# Patient Record
Sex: Female | Born: 1955
Health system: Southern US, Community
[De-identification: ages and names within clinical notes are randomized; demographics above are authoritative.]

## PROBLEM LIST (undated history)

## (undated) DIAGNOSIS — M1990 Unspecified osteoarthritis, unspecified site: Secondary | ICD-10-CM

## (undated) DIAGNOSIS — G43909 Migraine, unspecified, not intractable, without status migrainosus: Secondary | ICD-10-CM

## (undated) DIAGNOSIS — S92919A Unspecified fracture of unspecified toe(s), initial encounter for closed fracture: Secondary | ICD-10-CM

## (undated) DIAGNOSIS — S82899A Other fracture of unspecified lower leg, initial encounter for closed fracture: Secondary | ICD-10-CM

## (undated) DIAGNOSIS — N951 Menopausal and female climacteric states: Secondary | ICD-10-CM

## (undated) DIAGNOSIS — K219 Gastro-esophageal reflux disease without esophagitis: Secondary | ICD-10-CM

## (undated) DIAGNOSIS — D649 Anemia, unspecified: Secondary | ICD-10-CM

## (undated) DIAGNOSIS — M8580 Other specified disorders of bone density and structure, unspecified site: Secondary | ICD-10-CM

## (undated) DIAGNOSIS — F909 Attention-deficit hyperactivity disorder, unspecified type: Secondary | ICD-10-CM

## (undated) DIAGNOSIS — T7840XA Allergy, unspecified, initial encounter: Secondary | ICD-10-CM

## (undated) DIAGNOSIS — F32A Depression, unspecified: Secondary | ICD-10-CM

## (undated) DIAGNOSIS — G4730 Sleep apnea, unspecified: Secondary | ICD-10-CM

## (undated) DIAGNOSIS — N200 Calculus of kidney: Secondary | ICD-10-CM

## (undated) DIAGNOSIS — B019 Varicella without complication: Secondary | ICD-10-CM

## (undated) DIAGNOSIS — S069X9A Unspecified intracranial injury with loss of consciousness of unspecified duration, initial encounter: Secondary | ICD-10-CM

## (undated) DIAGNOSIS — F329 Major depressive disorder, single episode, unspecified: Secondary | ICD-10-CM

## (undated) HISTORY — DX: Unspecified fracture of unspecified toe(s), initial encounter for closed fracture: S92.919A

## (undated) HISTORY — DX: Sleep apnea, unspecified: G47.30

## (undated) HISTORY — DX: Migraine, unspecified, not intractable, without status migrainosus: G43.909

## (undated) HISTORY — DX: Gastro-esophageal reflux disease without esophagitis: K21.9

## (undated) HISTORY — DX: Attention-deficit hyperactivity disorder, unspecified type: F90.9

## (undated) HISTORY — DX: Varicella without complication: B01.9

## (undated) HISTORY — DX: Allergy, unspecified, initial encounter: T78.40XA

## (undated) HISTORY — PX: REPAIR NONUNION / MALUNION METATARSAL / TARSAL BONES: SUR1194

## (undated) HISTORY — DX: Anemia, unspecified: D64.9

## (undated) HISTORY — DX: Other fracture of unspecified lower leg, initial encounter for closed fracture: S82.899A

## (undated) HISTORY — PX: TONSILLECTOMY: SUR1361

## (undated) HISTORY — DX: Calculus of kidney: N20.0

## (undated) HISTORY — DX: Unspecified intracranial injury with loss of consciousness of unspecified duration, initial encounter: S06.9X9A

## (undated) HISTORY — DX: Depression, unspecified: F32.A

## (undated) HISTORY — PX: DILATION AND CURETTAGE OF UTERUS: SHX78

## (undated) HISTORY — DX: Menopausal and female climacteric states: N95.1

## (undated) HISTORY — DX: Major depressive disorder, single episode, unspecified: F32.9

## (undated) HISTORY — DX: Other specified disorders of bone density and structure, unspecified site: M85.80

## (undated) HISTORY — DX: Unspecified osteoarthritis, unspecified site: M19.90

---

## 1979-12-21 DIAGNOSIS — S069X9A Unspecified intracranial injury with loss of consciousness of unspecified duration, initial encounter: Secondary | ICD-10-CM

## 1979-12-21 DIAGNOSIS — S069XAA Unspecified intracranial injury with loss of consciousness status unknown, initial encounter: Secondary | ICD-10-CM

## 1979-12-21 HISTORY — DX: Unspecified intracranial injury with loss of consciousness status unknown, initial encounter: S06.9XAA

## 1979-12-21 HISTORY — DX: Unspecified intracranial injury with loss of consciousness of unspecified duration, initial encounter: S06.9X9A

## 1998-11-29 ENCOUNTER — Emergency Department (HOSPITAL_COMMUNITY): Admission: EM | Admit: 1998-11-29 | Discharge: 1998-11-29 | Payer: Self-pay | Admitting: Emergency Medicine

## 1998-11-29 ENCOUNTER — Encounter: Payer: Self-pay | Admitting: Emergency Medicine

## 1999-01-28 ENCOUNTER — Other Ambulatory Visit: Admission: RE | Admit: 1999-01-28 | Discharge: 1999-01-28 | Payer: Self-pay | Admitting: Obstetrics and Gynecology

## 1999-02-25 ENCOUNTER — Ambulatory Visit (HOSPITAL_COMMUNITY): Admission: RE | Admit: 1999-02-25 | Discharge: 1999-02-25 | Payer: Self-pay | Admitting: Obstetrics and Gynecology

## 2000-02-17 ENCOUNTER — Other Ambulatory Visit: Admission: RE | Admit: 2000-02-17 | Discharge: 2000-02-17 | Payer: Self-pay | Admitting: Obstetrics and Gynecology

## 2000-03-18 ENCOUNTER — Encounter: Admission: RE | Admit: 2000-03-18 | Discharge: 2000-03-18 | Payer: Self-pay | Admitting: Family Medicine

## 2000-03-18 ENCOUNTER — Encounter: Payer: Self-pay | Admitting: Family Medicine

## 2000-04-26 ENCOUNTER — Encounter: Admission: RE | Admit: 2000-04-26 | Discharge: 2000-07-25 | Payer: Self-pay | Admitting: Family Medicine

## 2001-03-23 ENCOUNTER — Encounter: Admission: RE | Admit: 2001-03-23 | Discharge: 2001-03-23 | Payer: Self-pay | Admitting: Obstetrics and Gynecology

## 2001-03-23 ENCOUNTER — Encounter: Payer: Self-pay | Admitting: Obstetrics and Gynecology

## 2001-04-18 ENCOUNTER — Other Ambulatory Visit: Admission: RE | Admit: 2001-04-18 | Discharge: 2001-04-18 | Payer: Self-pay | Admitting: *Deleted

## 2002-03-30 ENCOUNTER — Ambulatory Visit (HOSPITAL_BASED_OUTPATIENT_CLINIC_OR_DEPARTMENT_OTHER): Admission: RE | Admit: 2002-03-30 | Discharge: 2002-03-30 | Payer: Self-pay | Admitting: Family Medicine

## 2002-04-23 ENCOUNTER — Other Ambulatory Visit: Admission: RE | Admit: 2002-04-23 | Discharge: 2002-04-23 | Payer: Self-pay | Admitting: Obstetrics and Gynecology

## 2002-11-30 ENCOUNTER — Encounter: Payer: Self-pay | Admitting: Obstetrics and Gynecology

## 2002-11-30 ENCOUNTER — Encounter: Admission: RE | Admit: 2002-11-30 | Discharge: 2002-11-30 | Payer: Self-pay | Admitting: Obstetrics and Gynecology

## 2003-04-11 ENCOUNTER — Encounter: Payer: Self-pay | Admitting: Emergency Medicine

## 2003-04-11 ENCOUNTER — Emergency Department (HOSPITAL_COMMUNITY): Admission: EM | Admit: 2003-04-11 | Discharge: 2003-04-11 | Payer: Self-pay | Admitting: Emergency Medicine

## 2003-05-13 ENCOUNTER — Other Ambulatory Visit: Admission: RE | Admit: 2003-05-13 | Discharge: 2003-05-13 | Payer: Self-pay | Admitting: Obstetrics and Gynecology

## 2003-09-26 ENCOUNTER — Encounter: Admission: RE | Admit: 2003-09-26 | Discharge: 2003-09-26 | Payer: Self-pay | Admitting: Obstetrics and Gynecology

## 2003-09-26 ENCOUNTER — Encounter: Payer: Self-pay | Admitting: Obstetrics and Gynecology

## 2004-05-13 ENCOUNTER — Other Ambulatory Visit: Admission: RE | Admit: 2004-05-13 | Discharge: 2004-05-13 | Payer: Self-pay | Admitting: Obstetrics and Gynecology

## 2004-10-19 ENCOUNTER — Encounter: Admission: RE | Admit: 2004-10-19 | Discharge: 2004-10-19 | Payer: Self-pay | Admitting: Family Medicine

## 2005-04-30 ENCOUNTER — Ambulatory Visit (HOSPITAL_COMMUNITY): Admission: RE | Admit: 2005-04-30 | Discharge: 2005-04-30 | Payer: Self-pay | Admitting: Obstetrics and Gynecology

## 2005-04-30 ENCOUNTER — Ambulatory Visit (HOSPITAL_BASED_OUTPATIENT_CLINIC_OR_DEPARTMENT_OTHER): Admission: RE | Admit: 2005-04-30 | Discharge: 2005-04-30 | Payer: Self-pay | Admitting: Obstetrics and Gynecology

## 2005-04-30 ENCOUNTER — Encounter (INDEPENDENT_AMBULATORY_CARE_PROVIDER_SITE_OTHER): Payer: Self-pay | Admitting: Specialist

## 2006-06-16 ENCOUNTER — Encounter: Admission: RE | Admit: 2006-06-16 | Discharge: 2006-06-16 | Payer: Self-pay | Admitting: Family Medicine

## 2007-04-18 ENCOUNTER — Emergency Department (HOSPITAL_COMMUNITY): Admission: EM | Admit: 2007-04-18 | Discharge: 2007-04-18 | Payer: Self-pay | Admitting: Family Medicine

## 2008-02-27 ENCOUNTER — Encounter: Payer: Self-pay | Admitting: Family Medicine

## 2009-01-24 DIAGNOSIS — M8580 Other specified disorders of bone density and structure, unspecified site: Secondary | ICD-10-CM

## 2009-01-24 HISTORY — DX: Other specified disorders of bone density and structure, unspecified site: M85.80

## 2009-03-25 ENCOUNTER — Encounter: Admission: RE | Admit: 2009-03-25 | Discharge: 2009-03-25 | Payer: Self-pay | Admitting: Obstetrics and Gynecology

## 2009-04-22 ENCOUNTER — Encounter: Admission: RE | Admit: 2009-04-22 | Discharge: 2009-04-22 | Payer: Self-pay | Admitting: Obstetrics and Gynecology

## 2009-05-16 ENCOUNTER — Ambulatory Visit: Payer: Self-pay | Admitting: Family Medicine

## 2009-05-16 DIAGNOSIS — IMO0002 Reserved for concepts with insufficient information to code with codable children: Secondary | ICD-10-CM | POA: Insufficient documentation

## 2009-05-16 DIAGNOSIS — Z87442 Personal history of urinary calculi: Secondary | ICD-10-CM | POA: Insufficient documentation

## 2009-05-16 DIAGNOSIS — R519 Headache, unspecified: Secondary | ICD-10-CM | POA: Insufficient documentation

## 2009-05-16 DIAGNOSIS — R32 Unspecified urinary incontinence: Secondary | ICD-10-CM | POA: Insufficient documentation

## 2009-05-16 DIAGNOSIS — R51 Headache: Secondary | ICD-10-CM | POA: Insufficient documentation

## 2009-05-16 DIAGNOSIS — F909 Attention-deficit hyperactivity disorder, unspecified type: Secondary | ICD-10-CM | POA: Insufficient documentation

## 2009-05-16 DIAGNOSIS — F320 Major depressive disorder, single episode, mild: Secondary | ICD-10-CM

## 2009-05-16 DIAGNOSIS — J309 Allergic rhinitis, unspecified: Secondary | ICD-10-CM | POA: Insufficient documentation

## 2009-05-16 DIAGNOSIS — D649 Anemia, unspecified: Secondary | ICD-10-CM | POA: Insufficient documentation

## 2009-06-18 ENCOUNTER — Ambulatory Visit: Payer: Self-pay | Admitting: Family Medicine

## 2009-06-18 LAB — CONVERTED CEMR LAB
Nitrite: NEGATIVE
Protein, U semiquant: NEGATIVE
Urobilinogen, UA: 0.2
WBC Urine, dipstick: NEGATIVE
pH: 6

## 2009-06-20 ENCOUNTER — Encounter: Payer: Self-pay | Admitting: Family Medicine

## 2009-06-20 LAB — CONVERTED CEMR LAB
ALT: 16 units/L (ref 0–35)
AST: 22 units/L (ref 0–37)
Alkaline Phosphatase: 79 units/L (ref 39–117)
Basophils Absolute: 0 10*3/uL (ref 0.0–0.1)
Calcium: 9.3 mg/dL (ref 8.4–10.5)
Eosinophils Relative: 2.3 % (ref 0.0–5.0)
GFR calc non Af Amer: 69.68 mL/min (ref >60)
Glucose, Bld: 81 mg/dL (ref 70–99)
HCT: 36.7 % (ref 36.0–46.0)
HDL: 44.4 mg/dL (ref >39.00)
Hemoglobin: 12.3 g/dL (ref 12.0–15.0)
LDL Cholesterol: 98 mg/dL (ref 0–99)
Lymphocytes Relative: 35.4 % (ref 12.0–46.0)
Monocytes Relative: 8.7 % (ref 3.0–12.0)
Platelets: 233 10*3/uL (ref 150.0–400.0)
Potassium: 3.9 meq/L (ref 3.5–5.1)
RDW: 12.5 % (ref 11.5–14.6)
Sodium: 143 meq/L (ref 135–145)
Total Bilirubin: 0.5 mg/dL (ref 0.3–1.2)
VLDL: 13 mg/dL (ref 0.0–40.0)
WBC: 5.6 10*3/uL (ref 4.5–10.5)

## 2009-07-29 ENCOUNTER — Ambulatory Visit: Payer: Self-pay | Admitting: Family Medicine

## 2009-07-29 DIAGNOSIS — R071 Chest pain on breathing: Secondary | ICD-10-CM | POA: Insufficient documentation

## 2009-09-23 ENCOUNTER — Ambulatory Visit: Payer: Self-pay | Admitting: Internal Medicine

## 2009-09-23 ENCOUNTER — Encounter: Payer: Self-pay | Admitting: Family Medicine

## 2009-09-25 ENCOUNTER — Encounter (INDEPENDENT_AMBULATORY_CARE_PROVIDER_SITE_OTHER): Payer: Self-pay | Admitting: *Deleted

## 2009-10-07 ENCOUNTER — Ambulatory Visit: Payer: Self-pay | Admitting: Internal Medicine

## 2009-10-14 ENCOUNTER — Ambulatory Visit: Payer: Self-pay | Admitting: Internal Medicine

## 2009-10-14 HISTORY — PX: COLONOSCOPY: SHX174

## 2009-10-30 ENCOUNTER — Telehealth: Payer: Self-pay | Admitting: Family Medicine

## 2009-11-19 ENCOUNTER — Encounter (INDEPENDENT_AMBULATORY_CARE_PROVIDER_SITE_OTHER): Payer: Self-pay | Admitting: *Deleted

## 2009-12-08 ENCOUNTER — Ambulatory Visit: Payer: Self-pay | Admitting: Family Medicine

## 2009-12-08 DIAGNOSIS — J019 Acute sinusitis, unspecified: Secondary | ICD-10-CM | POA: Insufficient documentation

## 2010-02-12 ENCOUNTER — Telehealth: Payer: Self-pay | Admitting: Family Medicine

## 2010-03-03 ENCOUNTER — Ambulatory Visit: Payer: Self-pay | Admitting: Family Medicine

## 2010-06-02 ENCOUNTER — Telehealth: Payer: Self-pay | Admitting: Family Medicine

## 2010-08-04 ENCOUNTER — Telehealth: Payer: Self-pay | Admitting: Family Medicine

## 2010-08-11 ENCOUNTER — Ambulatory Visit (HOSPITAL_COMMUNITY): Admission: RE | Admit: 2010-08-11 | Discharge: 2010-08-11 | Payer: Self-pay | Admitting: Obstetrics and Gynecology

## 2010-09-08 ENCOUNTER — Telehealth: Payer: Self-pay | Admitting: Family Medicine

## 2010-09-11 ENCOUNTER — Ambulatory Visit: Payer: Self-pay | Admitting: Family Medicine

## 2010-09-11 LAB — CONVERTED CEMR LAB
ALT: 15 units/L (ref 0–35)
AST: 17 units/L (ref 0–37)
Albumin: 4.2 g/dL (ref 3.5–5.2)
Alkaline Phosphatase: 67 units/L (ref 39–117)
Basophils Absolute: 0 10*3/uL (ref 0.0–0.1)
Basophils Relative: 0.6 % (ref 0.0–3.0)
Calcium: 9.4 mg/dL (ref 8.4–10.5)
Eosinophils Relative: 2.3 % (ref 0.0–5.0)
GFR calc non Af Amer: 68.48 mL/min (ref >60)
HDL: 52.5 mg/dL (ref >39.00)
Hemoglobin: 13.2 g/dL (ref 12.0–15.0)
Ketones, ur: NEGATIVE mg/dL
LDL Cholesterol: 109 mg/dL — ABNORMAL HIGH (ref 0–99)
Lymphocytes Relative: 34.1 % (ref 12.0–46.0)
Monocytes Relative: 8.4 % (ref 3.0–12.0)
Neutro Abs: 3.3 10*3/uL (ref 1.4–7.7)
Potassium: 4.9 meq/L (ref 3.5–5.1)
RBC: 4.5 M/uL (ref 3.87–5.11)
RDW: 13.2 % (ref 11.5–14.6)
Sodium: 141 meq/L (ref 135–145)
Specific Gravity, Urine: 1.015 (ref 1.000–1.030)
Total CHOL/HDL Ratio: 3
Urine Glucose: NEGATIVE mg/dL
Urobilinogen, UA: 0.2 (ref 0.0–1.0)
VLDL: 12.4 mg/dL (ref 0.0–40.0)
WBC: 6.1 10*3/uL (ref 4.5–10.5)

## 2010-10-14 ENCOUNTER — Encounter: Payer: Self-pay | Admitting: Family Medicine

## 2010-10-14 ENCOUNTER — Ambulatory Visit: Payer: Self-pay | Admitting: Family Medicine

## 2010-11-10 ENCOUNTER — Telehealth: Payer: Self-pay | Admitting: Family Medicine

## 2010-12-08 ENCOUNTER — Telehealth: Payer: Self-pay | Admitting: Family Medicine

## 2010-12-15 ENCOUNTER — Telehealth: Payer: Self-pay | Admitting: Family Medicine

## 2010-12-17 ENCOUNTER — Encounter: Payer: Self-pay | Admitting: Family Medicine

## 2011-01-11 ENCOUNTER — Encounter: Payer: Self-pay | Admitting: Obstetrics and Gynecology

## 2011-01-19 NOTE — Assessment & Plan Note (Signed)
Summary: cough/congestion/njr   Vital Signs:  Patient profile:   55 year old female Weight:      165 pounds BMI:     28.42 Temp:     98.1 degrees F oral BP sitting:   130 / 80  (left arm) Cuff size:   regular  Vitals Entered By: Raechel Ache, RN (March 03, 2010 11:31 AM) CC: C/o head congestion, cough x 2 weeks.   History of Present Illness: here with one week of sinus pressure, HA, PND, ST, and a dry cough. No fever. Using Sudafed and Afrin sprays.   Allergies: 1)  ! Penicillin 2)  ! Septra  Past History:  Past Medical History: Chickenpox Allergic rhinitis, sees Dr. Stevphen Rochester Anemia-NOS Depression migraines Nephrolithiasis, hx of Urinary incontinence sees Debbora Dus NP for GYN exams menopausal syndrome brain injury 1981, fell off a desk chair, has permanent problems with short term memory, calculating abilities, etc. fractures to both great toes, ankle fracture sleep apnea per sleep study 03-30-02. Could not tolerate CPAP.  ADHD  Past Surgical History: Reviewed history from 05/16/2009 and no changes required. Tonsillectomy D&C  pinning to repair a fracture to the right 5th metatarsal  Review of Systems  The patient denies anorexia, fever, weight loss, weight gain, vision loss, decreased hearing, hoarseness, chest pain, syncope, dyspnea on exertion, peripheral edema, hemoptysis, abdominal pain, melena, hematochezia, severe indigestion/heartburn, hematuria, incontinence, genital sores, muscle weakness, suspicious skin lesions, transient blindness, difficulty walking, depression, unusual weight change, abnormal bleeding, enlarged lymph nodes, angioedema, breast masses, and testicular masses.    Physical Exam  General:  Well-developed,well-nourished,in no acute distress; alert,appropriate and cooperative throughout examination Head:  Normocephalic and atraumatic without obvious abnormalities. No apparent alopecia or balding. Eyes:  No corneal or conjunctival  inflammation noted. EOMI. Perrla. Funduscopic exam benign, without hemorrhages, exudates or papilledema. Vision grossly normal. Ears:  External ear exam shows no significant lesions or deformities.  Otoscopic examination reveals clear canals, tympanic membranes are intact bilaterally without bulging, retraction, inflammation or discharge. Hearing is grossly normal bilaterally. Nose:  External nasal examination shows no deformity or inflammation. Nasal mucosa are pink and moist without lesions or exudates. Mouth:  Oral mucosa and oropharynx without lesions or exudates.  Teeth in good repair. Neck:  No deformities, masses, or tenderness noted. Lungs:  Normal respiratory effort, chest expands symmetrically. Lungs are clear to auscultation, no crackles or wheezes.   Impression & Recommendations:  Problem # 1:  ACUTE SINUSITIS, UNSPECIFIED (ICD-461.9)  Her updated medication list for this problem includes:    Zithromax Z-pak 250 Mg Tabs (Azithromycin) .Marland Kitchen... As directed  Orders: Depo- Medrol 80mg  (J1040) Depo- Medrol 40mg  (J1030) Admin of Therapeutic Inj  intramuscular or subcutaneous (10272)  Complete Medication List: 1)  Concerta 36 Mg Tbcr (Methylphenidate hcl) .Marland Kitchen.. 1 by mouth daily, may fill on 04-13-10 2)  Prometrium 100 Mg Caps (Progesterone micronized) .Marland Kitchen.. 1 by mouth once daily 3)  Vivelle-dot 0.025 Mg/24hr Pttw (Estradiol) .... Twice a week 4)  Relpax 40 Mg Tabs (Eletriptan hydrobromide) .... As needed 5)  Wellbutrin Xl 300 Mg Xr24h-tab (Bupropion hcl) .... Once daily 6)  Vitamin D 400 Unit Tabs (Cholecalciferol) .... Once daily 7)  Etodolac 500 Mg Tabs (Etodolac) .... Two times a day 8)  Fexofenadine Hcl 180 Mg Tabs (Fexofenadine hcl) .Marland Kitchen.. 1 once daily as needed allergies 9)  Vitamin E 600 Unit Caps (Vitamin e) .... Once daily 10)  Calcium 1500 Mg Tabs (Calcium carbonate) .... Once daily 11)  Valtrex  1 Gm Tabs (Valacyclovir hcl) .Marland Kitchen.. 1 by mouth once daily as needed 12)  Zithromax  Z-pak 250 Mg Tabs (Azithromycin) .... As directed  Patient Instructions: 1)  Please schedule a follow-up appointment as needed .  2)  Off work today and tomorrow. Prescriptions: ZITHROMAX Z-PAK 250 MG TABS (AZITHROMYCIN) as directed  #1 x 0   Entered and Authorized by:   Nelwyn Salisbury MD   Signed by:   Nelwyn Salisbury MD on 03/03/2010   Method used:   Electronically to        [facility] # [phone]* (retail)       [address]       [place], [place]  [address]       Ph: [phone]       Fax: [phone]   RxID:   [id number]    Medication Administration  Injection # 1:    Medication: Depo- Medrol 80mg     Diagnosis: ACUTE SINUSITIS, UNSPECIFIED (ICD-461.9)    Route: IM    Site: RUOQ gluteus    Exp Date: 10/2010    Lot #: [id number]    Mfr: [email or web address]    Comments: 120 mg given    Patient tolerated injection without complications    Given by: Raechel Ache, RN (March 03, 2010 12:51 PM)  Injection # 2:    Medication: Depo- Medrol 40mg     Diagnosis: ACUTE SINUSITIS, UNSPECIFIED (ICD-461.9)    Route: IM    Site: RUOQ gluteus    Exp Date: 12/2011    Lot #: [id number]    Mfr: [email or web address]    Patient tolerated injection without complications    Given by: Raechel Ache, RN (March 03, 2010 12:52 PM)  Orders Added: 1)  Est. Patient Level IV [[address]] 2)  Depo- Medrol 80mg  [J1040] 3)  Depo- Medrol 40mg  [J1030] 4)  Admin of Therapeutic Inj  intramuscular or subcutaneous [66440]

## 2011-01-19 NOTE — Progress Notes (Signed)
Summary: PT WILL PICK UP FRIDAY IF READY  Phone Note Refill Request Message from:  Patient  Refills Requested: Medication #1:  CONCERTA 36 MG  TBCR 1 by mouth daily PT WILL PICK UP FRIDAY 09-11-10  Initial call taken by: Heron Sabins,  September 08, 2010 10:45 AM  Follow-up for Phone Call        done Follow-up by: Nelwyn Salisbury MD,  September 08, 2010 12:50 PM    New/Updated Medications: CONCERTA 36 MG  TBCR (METHYLPHENIDATE HCL) 1 by mouth daily CONCERTA 36 MG  TBCR (METHYLPHENIDATE HCL) 1 by mouth daily, may fill on 10-08-10 CONCERTA 36 MG  TBCR (METHYLPHENIDATE HCL) 1 by mouth daily, may fill on 11-08-10 Prescriptions: CONCERTA 36 MG  TBCR (METHYLPHENIDATE HCL) 1 by mouth daily, may fill on 11-08-10  #30 x 0   Entered and Authorized by:   Nelwyn Salisbury MD   Signed by:   Nelwyn Salisbury MD on 09/08/2010   Method used:   Print then Mail to Patient   RxID:   [id number] CONCERTA 36 MG  TBCR (METHYLPHENIDATE HCL) 1 by mouth daily, may fill on 10-08-10  #30 x 0   Entered and Authorized by:   Nelwyn Salisbury MD   Signed by:   Nelwyn Salisbury MD on 09/08/2010   Method used:   Print then Mail to Patient   RxID:   [id number] CONCERTA 36 MG  TBCR (METHYLPHENIDATE HCL) 1 by mouth daily  #30 x 0   Entered and Authorized by:   Nelwyn Salisbury MD   Signed by:   Nelwyn Salisbury MD on 09/08/2010   Method used:   Print then Mail to Patient   RxID:   [id number]

## 2011-01-19 NOTE — Progress Notes (Signed)
Summary: REFILL  Phone Note Call from Patient Call back at Home Phone [phone]   Caller: Patient---LIVE CALL Summary of Call: REFILL CONCERTA X RFS. Initial call taken by: Warnell Forester,  February 12, 2010 4:37 PM  Follow-up for Phone Call        done Follow-up by: Nelwyn Salisbury MD,  February 13, 2010 1:30 PM    New/Updated Medications: CONCERTA 36 MG  TBCR (METHYLPHENIDATE HCL) 1 by mouth daily CONCERTA 36 MG  TBCR (METHYLPHENIDATE HCL) 1 by mouth daily, may fill on 03-13-10 CONCERTA 36 MG  TBCR (METHYLPHENIDATE HCL) 1 by mouth daily, may fill on 04-13-10 Prescriptions: CONCERTA 36 MG  TBCR (METHYLPHENIDATE HCL) 1 by mouth daily, may fill on 04-13-10  #30 x 0   Entered and Authorized by:   Nelwyn Salisbury MD   Signed by:   Nelwyn Salisbury MD on 02/13/2010   Method used:   Print then Give to Patient   RxID:   [id number] CONCERTA 36 MG  TBCR (METHYLPHENIDATE HCL) 1 by mouth daily, may fill on 03-13-10  #30 x 0   Entered and Authorized by:   Nelwyn Salisbury MD   Signed by:   Nelwyn Salisbury MD on 02/13/2010   Method used:   Print then Give to Patient   RxID:   [id number] CONCERTA 36 MG  TBCR (METHYLPHENIDATE HCL) 1 by mouth daily  #30 x 0   Entered and Authorized by:   Nelwyn Salisbury MD   Signed by:   Nelwyn Salisbury MD on 02/13/2010   Method used:   Print then Give to Patient   RxID:   [id number]   Appended Document: REFILL in your box  Appended Document: REFILL rx up front ready for p/u, pt aware

## 2011-01-19 NOTE — Progress Notes (Signed)
Summary: ?lost rx  Phone Note Refill Request   Refills Requested: Medication #1:  CONCERTA 36 MG  TBCR 1 by mouth daily Pt cannot find rx. please advise  Initial call taken by: Heron Sabins,  November 10, 2010 9:35 AM Caller: Patient Call For: Nelwyn Salisbury MD  Follow-up for Phone Call        I gave her 3 different prescriptions for this. Did she get any of them filled?  Follow-up by: Nelwyn Salisbury MD,  November 10, 2010 12:51 PM  Additional Follow-up for Phone Call Additional follow up Details #1::        per pt she had filled the wellbututrrin and can't find the concerta rx.  Additional Follow-up by: Pura Spice, RN,  November 10, 2010 1:53 PM    Additional Follow-up for Phone Call Additional follow up Details #2::    I will refill a one month supply, but she will have to come in monthly now to pick them up  Follow-up by: Nelwyn Salisbury MD,  November 10, 2010 3:20 PM  Additional Follow-up for Phone Call Additional follow up Details #3:: Details for Additional Follow-up Action Taken: pt aware.  Additional Follow-up by: Pura Spice, RN,  November 10, 2010 3:22 PM  New/Updated Medications: CONCERTA 36 MG  TBCR (METHYLPHENIDATE HCL) 1 by mouth daily Prescriptions: CONCERTA 36 MG  TBCR (METHYLPHENIDATE HCL) 1 by mouth daily  #30 x 0   Entered and Authorized by:   Nelwyn Salisbury MD   Signed by:   Nelwyn Salisbury MD on 11/10/2010   Method used:   Print then Give to Patient   RxID:   [id number]

## 2011-01-19 NOTE — Consult Note (Signed)
Summary: [facility]- Allergy,Asthma, and Sinus Care  [facility], and Sinus Care Provider: This provider was preselected by the workflow.  Signature: The signature status of this document was preset by the workflow  Processed by [employer] Local Indexer Client @ Tuesday, November 04, 2009 3:15:01 PM using version:2010.1.2.11(2.4)   Manually Indexed By: [id number]  idlBatchDetail: [id number]   _____________________________________________________________________  External Attachment:    Type:   Image     Comment:   External Document

## 2011-01-19 NOTE — Progress Notes (Signed)
Summary: Additional Labs  Phone Note Call from Patient   Caller: Patient Reason for Call: Talk to Nurse Summary of Call: Patient would like you to call her regarding some additional testing she would like done for her physical.  She would also like to know if her October physical could be moved up...but I do not see any availability.  Plese give her  a call [phone])  Thanks Initial call taken by: Everrett Coombe,  August 04, 2010 12:03 PM  Follow-up for Phone Call        called. Follow-up by: Raechel Ache, RN,  August 04, 2010 1:45 PM

## 2011-01-19 NOTE — Progress Notes (Signed)
Summary: Pt called req script for Concerta   Phone Note Call from Patient Call back at Home Phone [phone]   Caller: Patient Summary of Call: Pt called req script for concerta 36mg  30 day supply - 3 months.        Initial call taken by: Lucy Antigua,  June 02, 2010 3:49 PM  Follow-up for Phone Call        done Follow-up by: Nelwyn Salisbury MD,  June 03, 2010 5:31 PM  Additional Follow-up for Phone Call Additional follow up Details #1::        called Additional Follow-up by: Raechel Ache, RN,  June 04, 2010 8:21 AM    New/Updated Medications: CONCERTA 36 MG  TBCR (METHYLPHENIDATE HCL) 1 by mouth daily CONCERTA 36 MG  TBCR (METHYLPHENIDATE HCL) 1 by mouth daily, may fill on 07-03-10 CONCERTA 36 MG  TBCR (METHYLPHENIDATE HCL) 1 by mouth daily, may fill on 08-03-10 Prescriptions: CONCERTA 36 MG  TBCR (METHYLPHENIDATE HCL) 1 by mouth daily, may fill on 08-03-10  #30 x 0   Entered and Authorized by:   Nelwyn Salisbury MD   Signed by:   Nelwyn Salisbury MD on 06/03/2010   Method used:   Print then Give to Patient   RxID:   [id number] CONCERTA 36 MG  TBCR (METHYLPHENIDATE HCL) 1 by mouth daily, may fill on 07-03-10  #30 x 0   Entered and Authorized by:   Nelwyn Salisbury MD   Signed by:   Nelwyn Salisbury MD on 06/03/2010   Method used:   Print then Give to Patient   RxID:   [id number] CONCERTA 36 MG  TBCR (METHYLPHENIDATE HCL) 1 by mouth daily  #30 x 0   Entered and Authorized by:   Nelwyn Salisbury MD   Signed by:   Nelwyn Salisbury MD on 06/03/2010   Method used:   Print then Give to Patient   RxID:   [id number]

## 2011-01-19 NOTE — Assessment & Plan Note (Signed)
Summary: CPX      pt rsc/njr   Vital Signs:  Patient profile:   55 year old female Height:      64 inches Weight:      145 pounds BMI:     24.98 O2 Sat:      98 % Temp:     98.5 degrees F Pulse rate:   94 / minute BP sitting:   120 / 80  (left arm) Cuff size:   regular  Vitals Entered By: Pura Spice, RN (October 14, 2010 2:51 PM) CC: cpx no pain    History of Present Illness: 55 yr old female for a cpx. She feels fine and has no complaints. Exercises regularly.   Allergies: 1)  ! Penicillin 2)  ! Septra  Past History:  Past Medical History: Reviewed history from 03/03/2010 and no changes required. Chickenpox Allergic rhinitis, sees Dr. Stevphen Rochester Anemia-NOS Depression migraines Nephrolithiasis, hx of Urinary incontinence sees Debbora Dus NP for GYN exams menopausal syndrome brain injury 1981, fell off a desk chair, has permanent problems with short term memory, calculating abilities, etc. fractures to both great toes, ankle fracture sleep apnea per sleep study 03-30-02. Could not tolerate CPAP.  ADHD  Past Surgical History: Tonsillectomy D&C  pinning to repair a fracture to the right 5th metatarsal colonoscopy 10-14-09 per Dr. Stan Head, clear, repeat in 10 yrs DEXA 01-24-09, showed osteopenia  Past History:  Care Management: Gynecology: Dr Maurice March Gastroenterology: Dr Leone Payor   Family History: Reviewed history from 05/16/2009 and no changes required. Family History Diabetes 1st degree relative Family History High cholesterol Family History Hypertension Family History of Prostate CA 1st degree relative <50 Family History of Stroke F 1st degree relative <60 daughter and 2 cousins have Hashimoto's thyroiditis  Social History: Reviewed history from 05/16/2009 and no changes required. 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  Review of Systems  The patient denies anorexia, fever, weight loss, weight gain, vision loss, decreased hearing, hoarseness, chest pain, syncope, dyspnea on exertion, peripheral edema, prolonged cough, headaches, hemoptysis, abdominal pain, melena, hematochezia, severe indigestion/heartburn, hematuria, incontinence, genital sores, muscle weakness, suspicious skin lesions, transient blindness, difficulty walking, depression, unusual weight change, abnormal bleeding, enlarged lymph nodes, angioedema, breast masses, and testicular masses.         Flu Vaccine Consent Questions     Do you have a history of severe allergic reactions to this vaccine? no    Any prior history of allergic reactions to egg and/or gelatin? no    Do you have a sensitivity to the preservative Thimersol? no    Do you have a past history of Guillan-Barre Syndrome? no    Do you currently have an acute febrile illness? no    Have you ever had a severe reaction to latex? no    Vaccine information given and explained to patient? yes    Are you currently pregnant? no    Lot Number:[id number]   Exp Date:06/19/2011   Site Given  Left Deltoid IM Pura Spice, RN  October 14, 2010 2:52 PM   Physical Exam  General:  Well-developed,well-nourished,in no acute distress; alert,appropriate and cooperative throughout examination Head:  Normocephalic and atraumatic without obvious abnormalities. No apparent alopecia or balding. Eyes:  No corneal or conjunctival inflammation noted. EOMI. Perrla. Funduscopic exam benign, without hemorrhages, exudates  or papilledema. Vision grossly normal. Ears:  External ear exam shows no significant lesions or deformities.  Otoscopic examination reveals clear canals, tympanic membranes are intact bilaterally without bulging, retraction, inflammation or discharge. Hearing is grossly normal bilaterally. Nose:  External nasal examination shows no deformity or inflammation.  Nasal mucosa are pink and moist without lesions or exudates. Mouth:  Oral mucosa and oropharynx without lesions or exudates.  Teeth in good repair. Neck:  No deformities, masses, or tenderness noted. Chest Wall:  No deformities, masses, or tenderness noted. Lungs:  Normal respiratory effort, chest expands symmetrically. Lungs are clear to auscultation, no crackles or wheezes. Heart:  Normal rate and regular rhythm. S1 and S2 normal without gallop, murmur, click, rub or other extra sounds. EKG normal Abdomen:  Bowel sounds positive,abdomen soft and non-tender without masses, organomegaly or hernias noted. Msk:  No deformity or scoliosis noted of thoracic or lumbar spine.   Pulses:  R and L carotid,radial,femoral,dorsalis pedis and posterior tibial pulses are full and equal bilaterally Extremities:  No clubbing, cyanosis, edema, or deformity noted with normal full range of motion of all joints.   Neurologic:  No cranial nerve deficits noted. Station and gait are normal. Plantar reflexes are down-going bilaterally. DTRs are symmetrical throughout. Sensory, motor and coordinative functions appear intact. Skin:  Intact without suspicious lesions or rashes Cervical Nodes:  No lymphadenopathy noted Axillary Nodes:  No palpable lymphadenopathy Inguinal Nodes:  No significant adenopathy Psych:  Cognition and judgment appear intact. Alert and cooperative with normal attention span and concentration. No apparent delusions, illusions, hallucinations   Impression & Recommendations:  Problem # 1:  HEALTH MAINTENANCE EXAM (ICD-V70.0)  Orders: EKG w/ Interpretation (93000)  Complete Medication List: 1)  Concerta 36 Mg Tbcr (Methylphenidate hcl) .Marland Kitchen.. 1 by mouth daily, may fill on 12-14-10 2)  Prometrium 100 Mg Caps (Progesterone micronized) .Marland Kitchen.. 1 by mouth once daily 3)  Vivelle-dot 0.025 Mg/24hr Pttw (Estradiol) .... Twice a week 4)  Relpax 40 Mg Tabs (Eletriptan hydrobromide) .... As needed 5)   Wellbutrin Xl 300 Mg Xr24h-tab (Bupropion hcl) .... Once daily 6)  Fexofenadine Hcl 180 Mg Tabs (Fexofenadine hcl) .Marland Kitchen.. 1 once daily as needed allergies 7)  Vitamin E 600 Unit Caps (Vitamin e) .... Once daily 8)  Calcium 1500 Mg Tabs (Calcium carbonate) .... Once daily 9)  Valtrex 1 Gm Tabs (Valacyclovir hcl) .Marland Kitchen.. 1 by mouth once daily as needed  Other Orders: Admin 1st Vaccine (16109) Flu Vaccine 43yrs + ([address]) Tdap => 66yrs IM (09811) Admin of Any Addtl Vaccine (91478)  Patient Instructions: 1)  Please schedule a follow-up appointment in 6 months .  Prescriptions: VALTREX 1 GM TABS (VALACYCLOVIR HCL) 1 by mouth once daily as needed  #30 x 11   Entered and Authorized by:   Nelwyn Salisbury MD   Signed by:   Nelwyn Salisbury MD on 10/14/2010   Method used:   Electronically to        [facility] # [phone]* (retail)       [address]       [place], [place]  [address]       Ph: [phone]       Fax: [phone]   RxID:   [id number] WELLBUTRIN XL 300 MG XR24H-TAB (BUPROPION HCL) once daily  #30 x 11   Entered and Authorized by:   Nelwyn Salisbury MD   Signed by:   Nelwyn Salisbury MD on 10/14/2010   Method used:   Electronically  to        [facility] # [phone]* (retail)       [address]       [place], [place]  [address]       Ph: [phone]       Fax: [phone]   RxID:   [id number] RELPAX 40 MG TABS (ELETRIPTAN HYDROBROMIDE) as needed  #12 x 11   Entered and Authorized by:   Nelwyn Salisbury MD   Signed by:   Nelwyn Salisbury MD on 10/14/2010   Method used:   Electronically to        [facility] # [phone]* (retail)       [address]       [place], [place]  [address]       Ph: [phone]       Fax: [phone]   RxID:   [id number] CONCERTA 36 MG  TBCR (METHYLPHENIDATE HCL) 1 by mouth daily, may fill on 12-14-10  #30 x 0   Entered and Authorized by:   Nelwyn Salisbury MD   Signed by:   Nelwyn Salisbury MD on 10/14/2010   Method used:   Print then Give to  Patient   RxID:   [id number] CONCERTA 36 MG  TBCR (METHYLPHENIDATE HCL) 1 by mouth daily, may fill on 11-14-10  #30 x 0   Entered and Authorized by:   Nelwyn Salisbury MD   Signed by:   Nelwyn Salisbury MD on 10/14/2010   Method used:   Print then Give to Patient   RxID:   [id number] CONCERTA 36 MG  TBCR (METHYLPHENIDATE HCL) 1 by mouth daily  #30 x 0   Entered and Authorized by:   Nelwyn Salisbury MD   Signed by:   Nelwyn Salisbury MD on 10/14/2010   Method used:   Print then Give to Patient   RxID:   [id number]    Orders Added: 1)  Admin 1st Vaccine [90471] 2)  Flu Vaccine 76yrs + [85462] 3)  Tdap => 63yrs IM [70350] 4)  Admin of Any Addtl Vaccine [90472] 5)  Est. Patient 40-64 years [99396] 6)  EKG w/ Interpretation [93000]   Immunizations Administered:  Tetanus Vaccine:    Vaccine Type: Tdap    Site: right deltoid    Mfr: GlaxoSmithKline    Dose: 0.5 ml    Route: IM    Given by: Pura Spice, RN    Exp. Date: 10/08/2012    Lot #: [id number]    VIS given: 11/06/08 version given October 14, 2010.   Immunizations Administered:  Tetanus Vaccine:    Vaccine Type: Tdap    Site: right deltoid    Mfr: GlaxoSmithKline    Dose: 0.5 ml    Route: IM    Given by: Pura Spice, RN    Exp. Date: 10/08/2012    Lot #: [id number]    VIS given: 11/06/08 version given October 14, 2010.

## 2011-01-19 NOTE — Progress Notes (Signed)
Summary: lab request  Phone Note Call from Patient   Caller: Patient Summary of Call: family hx Hashimoto's and wants anti-TPO antibody drawn with CPX labs. I told her we did TSH but she wants this specific test done. Initial call taken by: Raechel Ache, RN,  August 04, 2010 2:02 PM  Follow-up for Phone Call        okay, add Thyroid Peroxidase Ab [phone]) for 245.9  Follow-up by: Nelwyn Salisbury MD,  August 05, 2010 9:26 AM  Additional Follow-up for Phone Call Additional follow up Details #1::        ok Additional Follow-up by: Raechel Ache, RN,  August 05, 2010 10:45 AM

## 2011-01-21 NOTE — Progress Notes (Signed)
Summary: Pt came by to check on status of Prior Auth for Concerta  Phone Note Call from Patient Call back at Home Phone [phone]   Caller: Patient Summary of Call: Pt called to check on the prior auth for Concerta. Pt only has 1 pill lft. Prior Auth form was given to Dr Clent Ridges on 12/10/10 to complete and return to [facility]. Pls advise. Initial call taken by: Lucy Antigua,  December 15, 2010 1:49 PM  Follow-up for Phone Call        this was filled out first thing this am Follow-up by: Nelwyn Salisbury MD,  December 16, 2010 8:35 AM  Additional Follow-up for Phone Call Additional follow up Details #1::        pt is aware. Additional Follow-up by: Warnell Forester,  December 17, 2010 9:55 AM

## 2011-01-21 NOTE — Progress Notes (Signed)
Summary: refill  Phone Note Refill Request Call back at Home Phone [phone] Message from:  Brandi Parker---triage vm  Refills Requested: Medication #1:  CONCERTA 36 MG  TBCR 1 by mouth daily please call when ready.  Initial call taken by: Warnell Forester,  December 08, 2010 9:43 AM Caller: Brandi Parker  Follow-up for Phone Call        Pt called back to check on status of refill. Would like to pick this up today. Follow-up by: Lucy Antigua,  December 08, 2010 10:22 AM  Additional Follow-up for Phone Call Additional follow up Details #1::        done Additional Follow-up by: Nelwyn Salisbury MD,  December 09, 2010 8:38 AM    Additional Follow-up for Phone Call Additional follow up Details #2::    pt aware Follow-up by: Pura Spice, RN,  December 09, 2010 9:21 AM  New/Updated Medications: CONCERTA 36 MG  TBCR (METHYLPHENIDATE HCL) 1 by mouth daily, may fill on 01-09-11 CONCERTA 36 MG  TBCR (METHYLPHENIDATE HCL) 1 by mouth daily, may fill on 02-09-11 Prescriptions: CONCERTA 36 MG  TBCR (METHYLPHENIDATE HCL) 1 by mouth daily, may fill on 02-09-11  #30 x 0   Entered and Authorized by:   Nelwyn Salisbury MD   Signed by:   Nelwyn Salisbury MD on 12/09/2010   Method used:   Print then Give to Brandi Parker   RxID:   [id number] CONCERTA 36 MG  TBCR (METHYLPHENIDATE HCL) 1 by mouth daily, may fill on 01-09-11  #30 x 0   Entered and Authorized by:   Nelwyn Salisbury MD   Signed by:   Nelwyn Salisbury MD on 12/09/2010   Method used:   Print then Give to Brandi Parker   RxID:   [id number] CONCERTA 36 MG  TBCR (METHYLPHENIDATE HCL) 1 by mouth daily  #30 x 0   Entered and Authorized by:   Nelwyn Salisbury MD   Signed by:   Nelwyn Salisbury MD on 12/09/2010   Method used:   Print then Give to Brandi Parker   RxID:   [id number]

## 2011-01-21 NOTE — Medication Information (Signed)
Summary: PA & Approval for Concerta Tab Er  PA & Approval for Concerta Tab Er   Imported By: Maryln Gottron 12/23/2010 14:15:37  _____________________________________________________________________  External Attachment:    Type:   Image     Comment:   External Document

## 2011-03-25 ENCOUNTER — Other Ambulatory Visit: Payer: Self-pay | Admitting: Family Medicine

## 2011-03-25 MED ORDER — METHYLPHENIDATE HCL ER (OSM) 36 MG PO TBCR: 36.0000 mg | EXTENDED_RELEASE_TABLET | Freq: Every day | ORAL | Status: DC

## 2011-03-25 NOTE — Telephone Encounter (Signed)
Pt aware rx concerta ready for pick up

## 2011-03-25 NOTE — Telephone Encounter (Signed)
done

## 2011-03-25 NOTE — Telephone Encounter (Signed)
Refill Concerta

## 2011-05-07 NOTE — Op Note (Signed)
NAME:  Brandi Parker, Brandi Parker               ACCOUNT NO.:  [id number]   MEDICAL RECORD NO.:  [id number]          PATIENT TYPE:  AMB   LOCATION:  [facility]                         FACILITY:  [facility]   PHYSICIAN:  Sherry A. Dickstein, M.D.DATE OF BIRTH:  [birth year]   DATE OF PROCEDURE:  04/30/2005  DATE OF DISCHARGE:                                 OPERATIVE REPORT   PREOPERATIVE DIAGNOSIS:  Endometrial polyp.   POSTOPERATIVE DIAGNOSIS:  Endometrial polyp.   OPERATION/PROCEDURE:  Dilatation and curettage and hysteroscopy with  resectoscope.   SURGEON:  Sherry A. Rosalio Macadamia, M.D.   ANESTHESIA:  MAC.   INDICATIONS:  This is a 55 year old G1, P 1-0-0-1 woman who has intermittent  spotting on continuous Ortho Evra patches.  Because of this PT had an  ultrasound performed and a sonohystogram performed which revealed  endometrial polyp.  Because of the presence of the endometrial polyp.  The  patient is brought to the operating room for [facility] and hysteroscopy with a  resectoscope.   FINDINGS:  Normal size antiflexed uterus with no adnexal mass.  Endometrial  polyp present in the posterior wall.   DESCRIPTION OF PROCEDURE:  The patient is brought into the operating room  and given adequate IV sedation.  She was placed in the dorsal lithotomy  position.  Perineum was washed with Betadine.  Pelvic examination was  performed.  The patient is draped in sterile fashion.  Speculum was placed  within the vagina.  The vagina was washed with Betadine.  Paracervical block  was administered with 1% __________ .  Anterior lip of the cervix was  grasped with a single-tooth tenaculum.  Cervix was sounded.  Cervix was  dilated with Pratt dilators to a #31.  Hysteroscope was introduced into the  endometrial cavity.  Pictures were obtained using a double loop right angle  resector.  The endometrial polyp was resected.  Endometrial resections were  taken circumferentially.  Adequate hemostasis was present.  Pictures  were  obtained.  The hysteroscopy was removed.  The instruments were removed from  the vagina.  Adequate hemostasis was obtained.   There was a small sebaceous cyst on the left labia minora that the patient  had noted and requested removed if at all possible.  This was infiltrated  with 0.25% Marcaine and a small incision made with a scalpel.  This was  excised with pressure.  Monsel solution was placed for adequate hemostasis.  The patient was taken out of the dorsal lithotomy position, was awakened.  She moved from the operating table to a stretcher in stable condition.  Complications were none.  Estimated blood loss was less than 5 mL.     SAD/MEDQ  D:  04/30/2005  T:  04/30/2005  Job:  [id number]

## 2011-07-22 ENCOUNTER — Telehealth: Payer: Self-pay | Admitting: Family Medicine

## 2011-07-22 NOTE — Telephone Encounter (Signed)
Pt called 8/2. Would like a refill on her Concerta 36mg . Please call her when she can come pick it up.

## 2011-07-22 NOTE — Telephone Encounter (Signed)
Refill request for Concerta 36 mg CR, pt last here 11/11 and script last filled on 03/25/11.

## 2011-07-23 MED ORDER — METHYLPHENIDATE HCL ER (OSM) 36 MG PO TBCR: 36.0000 mg | EXTENDED_RELEASE_TABLET | Freq: Every day | ORAL | Status: DC

## 2011-07-23 NOTE — Telephone Encounter (Signed)
Script ready for pick up and pt aware. 

## 2011-07-23 NOTE — Telephone Encounter (Signed)
done

## 2011-10-25 ENCOUNTER — Other Ambulatory Visit: Payer: Self-pay | Admitting: Family Medicine

## 2011-10-27 ENCOUNTER — Telehealth: Payer: Self-pay

## 2011-10-27 NOTE — Telephone Encounter (Signed)
rx request for bupropion xl 300 mg. Pt last seen 10/14/10. Pls advise.

## 2011-10-28 MED ORDER — BUPROPION HCL ER (XL) 300 MG PO TB24: 300.0000 mg | ORAL_TABLET | ORAL | Status: DC

## 2011-10-28 MED ORDER — METHYLPHENIDATE HCL ER (OSM) 36 MG PO TBCR: 36.0000 mg | EXTENDED_RELEASE_TABLET | Freq: Every day | ORAL | Status: DC

## 2011-10-28 NOTE — Telephone Encounter (Signed)
Spoke with pt and she states the first appt she could get is 12/28/11.  Pt states she only needs 1 month of bupropion and 1 month worth of concerta.  Pls advis.e

## 2011-10-28 NOTE — Telephone Encounter (Signed)
Wrote for one month of each

## 2011-10-28 NOTE — Telephone Encounter (Signed)
She needs an OV first

## 2011-10-29 NOTE — Telephone Encounter (Signed)
Called pt & she is aware the Concerta is ready for pick up.

## 2011-11-05 ENCOUNTER — Other Ambulatory Visit (HOSPITAL_COMMUNITY): Payer: Self-pay | Admitting: Obstetrics and Gynecology

## 2011-11-05 DIAGNOSIS — Z1231 Encounter for screening mammogram for malignant neoplasm of breast: Secondary | ICD-10-CM

## 2011-11-09 ENCOUNTER — Ambulatory Visit (HOSPITAL_COMMUNITY)
Admission: RE | Admit: 2011-11-09 | Discharge: 2011-11-09 | Disposition: A | Discharge status: Home / Self Care | Payer: BC Managed Care – PPO | Source: Ambulatory Visit | Attending: Obstetrics and Gynecology | Admitting: Obstetrics and Gynecology

## 2011-11-09 DIAGNOSIS — Z1231 Encounter for screening mammogram for malignant neoplasm of breast: Secondary | ICD-10-CM | POA: Insufficient documentation

## 2011-12-15 ENCOUNTER — Other Ambulatory Visit: Payer: Self-pay

## 2011-12-15 ENCOUNTER — Ambulatory Visit: Payer: BC Managed Care – PPO

## 2011-12-15 DIAGNOSIS — Z0000 Encounter for general adult medical examination without abnormal findings: Secondary | ICD-10-CM

## 2011-12-15 LAB — BASIC METABOLIC PANEL
BUN: 19 mg/dL (ref 6–23)
CO2: 29 mEq/L (ref 19–32)
Chloride: 106 mEq/L (ref 96–112)
Creatinine, Ser: 1.1 mg/dL (ref 0.4–1.2)
Glucose, Bld: 91 mg/dL (ref 70–99)
Potassium: 4 mEq/L (ref 3.5–5.1)

## 2011-12-15 LAB — URINALYSIS
Bilirubin Urine: NEGATIVE
Ketones, ur: NEGATIVE
Specific Gravity, Urine: 1.02 (ref 1.000–1.030)
Total Protein, Urine: NEGATIVE
Urine Glucose: NEGATIVE
pH: 6.5 (ref 5.0–8.0)

## 2011-12-15 LAB — LIPID PANEL
Cholesterol: 180 mg/dL (ref 0–200)
LDL Cholesterol: 104 mg/dL — ABNORMAL HIGH (ref 0–99)
Total CHOL/HDL Ratio: 3
VLDL: 14.4 mg/dL (ref 0.0–40.0)

## 2011-12-15 LAB — HEPATIC FUNCTION PANEL
Bilirubin, Direct: 0.1 mg/dL (ref 0.0–0.3)
Total Bilirubin: 0.6 mg/dL (ref 0.3–1.2)
Total Protein: 7 g/dL (ref 6.0–8.3)

## 2011-12-15 LAB — CBC WITH DIFFERENTIAL/PLATELET
Basophils Relative: 0.5 % (ref 0.0–3.0)
HCT: 38.4 % (ref 36.0–46.0)
Hemoglobin: 12.7 g/dL (ref 12.0–15.0)
Lymphocytes Relative: 35.9 % (ref 12.0–46.0)
Lymphs Abs: 2.2 10*3/uL (ref 0.7–4.0)
MCHC: 33.1 g/dL (ref 30.0–36.0)
Monocytes Relative: 9 % (ref 3.0–12.0)
Neutro Abs: 3.2 10*3/uL (ref 1.4–7.7)
RBC: 4.54 Mil/uL (ref 3.87–5.11)
RDW: 13 % (ref 11.5–14.6)

## 2011-12-16 HISTORY — PX: KNEE ARTHROSCOPY: SUR90

## 2011-12-20 ENCOUNTER — Other Ambulatory Visit: Payer: Self-pay

## 2011-12-23 NOTE — Progress Notes (Signed)
Quick Note:  Spoke with pt ______ 

## 2011-12-28 ENCOUNTER — Encounter: Payer: Self-pay | Admitting: Family Medicine

## 2011-12-28 ENCOUNTER — Ambulatory Visit (INDEPENDENT_AMBULATORY_CARE_PROVIDER_SITE_OTHER): Payer: BC Managed Care – PPO | Admitting: Family Medicine

## 2011-12-28 VITALS — BP 118/80 | HR 101 | Temp 98.6°F | Ht 64.25 in | Wt 154.0 lb

## 2011-12-28 DIAGNOSIS — Z0000 Encounter for general adult medical examination without abnormal findings: Secondary | ICD-10-CM

## 2011-12-28 MED ORDER — VALACYCLOVIR HCL 1 G PO TABS: 1000.0000 mg | ORAL_TABLET | ORAL | Status: DC | PRN

## 2011-12-28 MED ORDER — METHYLPHENIDATE HCL ER (OSM) 36 MG PO TBCR: 36.0000 mg | EXTENDED_RELEASE_TABLET | Freq: Every day | ORAL | Status: DC

## 2011-12-28 MED ORDER — KETOCONAZOLE 2 % EX CREA: TOPICAL_CREAM | Freq: Two times a day (BID) | CUTANEOUS | Status: DC

## 2011-12-28 MED ORDER — ELETRIPTAN HYDROBROMIDE 40 MG PO TABS: 40.0000 mg | ORAL_TABLET | ORAL | Status: DC | PRN

## 2011-12-28 MED ORDER — BUPROPION HCL ER (XL) 300 MG PO TB24: 300.0000 mg | ORAL_TABLET | Freq: Every day | ORAL | Status: DC

## 2011-12-28 NOTE — Progress Notes (Signed)
  Subjective:    Patient ID: Brandi Parker, female    DOB: July 13, 1956, 56 y.o.   MRN: [id number]  HPI 56 yr old female for a cpx. She is doing well except for the fact that she is recovering from left knee arthroscopy on 12-15-12. This is progressing slowly. She is walking with a cane. She also asks for help with athlete's foot that has involved both feet for about a year.    Review of Systems  Constitutional: Negative.   HENT: Negative.   Eyes: Negative.   Respiratory: Negative.   Cardiovascular: Negative.   Gastrointestinal: Negative.   Genitourinary: Negative for dysuria, urgency, frequency, hematuria, flank pain, decreased urine volume, enuresis, difficulty urinating, pelvic pain and dyspareunia.  Musculoskeletal: Negative.   Skin: Negative.   Neurological: Negative.   Hematological: Negative.   Psychiatric/Behavioral: Negative.        Objective:   Physical Exam  Constitutional: She is oriented to person, place, and time. She appears well-developed and well-nourished. No distress.  HENT:  Head: Normocephalic and atraumatic.  Right Ear: External ear normal.  Left Ear: External ear normal.  Nose: Nose normal.  Mouth/Throat: Oropharynx is clear and moist. No oropharyngeal exudate.  Eyes: Conjunctivae and EOM are normal. Pupils are equal, round, and reactive to light. No scleral icterus.  Neck: Normal range of motion. Neck supple. No JVD present. No thyromegaly present.  Cardiovascular: Normal rate, regular rhythm, normal heart sounds and intact distal pulses.  Exam reveals no gallop and no friction rub.   No murmur heard.      EKG normal   Pulmonary/Chest: Effort normal and breath sounds normal. No respiratory distress. She has no wheezes. She has no rales. She exhibits no tenderness.  Abdominal: Soft. Bowel sounds are normal. She exhibits no distension and no mass. There is no tenderness. There is no rebound and no guarding.  Musculoskeletal: Normal range of motion. She  exhibits no edema and no tenderness.  Lymphadenopathy:    She has no cervical adenopathy.  Neurological: She is alert and oriented to person, place, and time. She has normal reflexes. No cranial nerve deficit. She exhibits normal muscle tone. Coordination normal.  Skin: Skin is warm and dry. No rash noted. No erythema.       Scaly macerated skin between all the toes  Psychiatric: She has a normal mood and affect. Her behavior is normal. Judgment and thought content normal.          Assessment & Plan:  Well exam. Try ketoconazole for the feet. Recheck prn

## 2012-04-20 ENCOUNTER — Other Ambulatory Visit: Payer: Self-pay | Admitting: Family Medicine

## 2012-04-20 NOTE — Telephone Encounter (Signed)
Pt needs new rx methylphenidate 36 mg °

## 2012-04-21 MED ORDER — METHYLPHENIDATE HCL ER (OSM) 36 MG PO TBCR: 36.0000 mg | EXTENDED_RELEASE_TABLET | Freq: Every day | ORAL | Status: DC

## 2012-04-21 NOTE — Telephone Encounter (Signed)
done

## 2012-04-21 NOTE — Telephone Encounter (Signed)
Script is ready for pick up and left voice message for pt. 

## 2012-04-25 ENCOUNTER — Other Ambulatory Visit: Payer: Self-pay | Admitting: Orthopaedic Surgery

## 2012-04-25 DIAGNOSIS — M25562 Pain in left knee: Secondary | ICD-10-CM

## 2012-04-30 ENCOUNTER — Ambulatory Visit
Admission: RE | Admit: 2012-04-30 | Discharge: 2012-04-30 | Disposition: A | Discharge status: Home / Self Care | Payer: BC Managed Care – PPO | Source: Ambulatory Visit | Attending: Orthopaedic Surgery | Admitting: Orthopaedic Surgery

## 2012-04-30 DIAGNOSIS — M25562 Pain in left knee: Secondary | ICD-10-CM

## 2012-05-31 ENCOUNTER — Other Ambulatory Visit: Payer: Self-pay | Admitting: Family Medicine

## 2012-05-31 MED ORDER — METHYLPHENIDATE HCL ER (OSM) 36 MG PO TBCR: 36.0000 mg | EXTENDED_RELEASE_TABLET | Freq: Every day | ORAL | Status: DC

## 2012-05-31 NOTE — Telephone Encounter (Signed)
Pt can not find her concerta rxs. Pt is requesting one rx for concerta.

## 2012-05-31 NOTE — Telephone Encounter (Signed)
done

## 2012-09-21 ENCOUNTER — Other Ambulatory Visit: Payer: Self-pay | Admitting: Family Medicine

## 2012-09-21 NOTE — Telephone Encounter (Signed)
Pt needs new rx generic concerta 36 mg °

## 2012-09-22 MED ORDER — METHYLPHENIDATE HCL ER (OSM) 36 MG PO TBCR: 36.0000 mg | EXTENDED_RELEASE_TABLET | Freq: Every day | ORAL | Status: DC

## 2012-09-22 NOTE — Telephone Encounter (Signed)
done

## 2012-09-22 NOTE — Telephone Encounter (Signed)
Scripts are ready for pick up and I left a voice message for pt. 

## 2012-10-06 ENCOUNTER — Ambulatory Visit (INDEPENDENT_AMBULATORY_CARE_PROVIDER_SITE_OTHER): Payer: BC Managed Care – PPO | Admitting: Family Medicine

## 2012-10-06 ENCOUNTER — Telehealth: Payer: Self-pay | Admitting: Family Medicine

## 2012-10-06 DIAGNOSIS — Z209 Contact with and (suspected) exposure to unspecified communicable disease: Secondary | ICD-10-CM

## 2012-10-06 NOTE — Telephone Encounter (Signed)
Can you put pt on injection schedule for this afternoon 4:30 pm ( tb skin test ) ?

## 2012-10-09 LAB — TB SKIN TEST
Induration: 0 mm
TB Skin Test: NEGATIVE

## 2012-10-23 ENCOUNTER — Telehealth: Payer: Self-pay | Admitting: Family Medicine

## 2012-10-23 NOTE — Telephone Encounter (Signed)
Caller: Baylyn/Patient; Patient Name: Brandi Parker; PCP: Gershon Crane [facility]); Best Callback Phone Number: [phone]; Reason for call: Sternum feels tender to touch and  cool icy feeling in esphagus for past 2 weeks.  Trouble swallowing on and off for past few months ( feels like food gets stuck). She has been taking 2 Alleve BID for past 3-4weeks for knee pain. She has upcoming surgery on her knee. Afebrile. Pain in sternum is worse with exertion. Triage and Care advice per Chest Pain and Swallowing Difficulty Protocols and appointment advised within within 24 hours for "pain brought on by or made worse by, movement of change of Postion (such as pushing down on arms of chair to get up) AND not previously evaluated." Appointment scheduled for 1500 on 10/24/12 per caller request.

## 2012-10-24 ENCOUNTER — Ambulatory Visit (INDEPENDENT_AMBULATORY_CARE_PROVIDER_SITE_OTHER): Payer: BC Managed Care – PPO | Admitting: Family Medicine

## 2012-10-24 ENCOUNTER — Encounter: Payer: Self-pay | Admitting: Family Medicine

## 2012-10-24 VITALS — BP 114/80 | HR 109 | Temp 98.2°F | Wt 160.0 lb

## 2012-10-24 DIAGNOSIS — R1013 Epigastric pain: Secondary | ICD-10-CM

## 2012-10-24 LAB — HEPATIC FUNCTION PANEL
ALT: 16 U/L (ref 0–35)
Alkaline Phosphatase: 77 U/L (ref 39–117)
Bilirubin, Direct: 0.1 mg/dL (ref 0.0–0.3)
Total Protein: 7.3 g/dL (ref 6.0–8.3)

## 2012-10-24 LAB — CBC WITH DIFFERENTIAL/PLATELET
Basophils Relative: 0.5 % (ref 0.0–3.0)
Eosinophils Relative: 1.6 % (ref 0.0–5.0)
Hemoglobin: 10.9 g/dL — ABNORMAL LOW (ref 12.0–15.0)
Lymphocytes Relative: 26.5 % (ref 12.0–46.0)
MCV: 87.1 fl (ref 78.0–100.0)
Monocytes Absolute: 0.8 10*3/uL (ref 0.1–1.0)
Neutrophils Relative %: 62.6 % (ref 43.0–77.0)
RBC: 4 Mil/uL (ref 3.87–5.11)
WBC: 9.2 10*3/uL (ref 4.5–10.5)

## 2012-10-24 LAB — BASIC METABOLIC PANEL
Chloride: 106 mEq/L (ref 96–112)
Creatinine, Ser: 1 mg/dL (ref 0.4–1.2)
Sodium: 141 mEq/L (ref 135–145)

## 2012-10-24 MED ORDER — OMEPRAZOLE 40 MG PO CPDR: 40.0000 mg | DELAYED_RELEASE_CAPSULE | Freq: Every day | ORAL | Status: DC

## 2012-10-24 NOTE — Progress Notes (Signed)
  Subjective:    Patient ID: Brandi Parker, female    DOB: 1956/01/13, 56 y.o.   MRN: [id number]  HPI Here for a number of GI issues. First for the past 6 months she has had frequent heartburn and squeezing type chest pains while eating. Often food seems to stop partway down when she swallows. This may cause her to vomit up the food at times. She has tried some OTC antacids with no real improvement. Now for the past 3 weeks she has also had some intermittent epigastric pains which may involve both upper quadrants of the abdomen. This does not seem to have as close a relationship with eating food. Her BMs have been regular but are dark at times. No fevers.    Review of Systems  Constitutional: Negative.   Respiratory: Negative.   Cardiovascular: Positive for chest pain. Negative for palpitations and leg swelling.  Gastrointestinal: Positive for nausea, vomiting and abdominal pain. Negative for diarrhea, constipation, blood in stool and abdominal distention.       Objective:   Physical Exam  Constitutional: She appears well-developed and well-nourished.  Cardiovascular: Normal rate, regular rhythm, normal heart sounds and intact distal pulses.   Pulmonary/Chest: Effort normal and breath sounds normal.  Abdominal: Soft. Bowel sounds are normal. She exhibits no distension and no mass. There is no tenderness. There is no rebound and no guarding.          Assessment & Plan:  She has classic GERD symptoms and probably has some esophageal obstruction. Start on Omeprazole 40 mg daily. Refer to GI for upper endoscopy and probable esophageal dilation. Get an [place] to look at the gall bladder.

## 2012-10-25 ENCOUNTER — Encounter: Payer: Self-pay | Admitting: Internal Medicine

## 2012-10-25 ENCOUNTER — Other Ambulatory Visit (HOSPITAL_COMMUNITY): Payer: Self-pay | Admitting: Obstetrics and Gynecology

## 2012-10-25 DIAGNOSIS — Z1231 Encounter for screening mammogram for malignant neoplasm of breast: Secondary | ICD-10-CM

## 2012-10-30 ENCOUNTER — Encounter: Payer: Self-pay | Admitting: Family Medicine

## 2012-10-30 ENCOUNTER — Ambulatory Visit
Admission: RE | Admit: 2012-10-30 | Discharge: 2012-10-30 | Disposition: A | Discharge status: Home / Self Care | Payer: BC Managed Care – PPO | Source: Ambulatory Visit | Attending: Family Medicine | Admitting: Family Medicine

## 2012-10-30 DIAGNOSIS — R1013 Epigastric pain: Secondary | ICD-10-CM

## 2012-10-30 NOTE — Progress Notes (Signed)
Quick Note:  I spoke with pt and put a copy in mail. ______

## 2012-10-30 NOTE — Progress Notes (Signed)
Quick Note:  I spoke with pt ______ 

## 2012-11-21 ENCOUNTER — Ambulatory Visit (HOSPITAL_COMMUNITY)
Admission: RE | Admit: 2012-11-21 | Discharge: 2012-11-21 | Disposition: A | Discharge status: Home / Self Care | Payer: BC Managed Care – PPO | Source: Ambulatory Visit | Attending: Obstetrics and Gynecology | Admitting: Obstetrics and Gynecology

## 2012-11-21 DIAGNOSIS — Z1231 Encounter for screening mammogram for malignant neoplasm of breast: Secondary | ICD-10-CM

## 2012-11-28 ENCOUNTER — Encounter: Payer: Self-pay | Admitting: Internal Medicine

## 2012-11-28 ENCOUNTER — Other Ambulatory Visit (INDEPENDENT_AMBULATORY_CARE_PROVIDER_SITE_OTHER): Payer: BC Managed Care – PPO

## 2012-11-28 ENCOUNTER — Ambulatory Visit (INDEPENDENT_AMBULATORY_CARE_PROVIDER_SITE_OTHER): Payer: BC Managed Care – PPO | Admitting: Internal Medicine

## 2012-11-28 VITALS — BP 114/72 | HR 89 | Ht 64.0 in | Wt 162.8 lb

## 2012-11-28 DIAGNOSIS — R1013 Epigastric pain: Secondary | ICD-10-CM

## 2012-11-28 DIAGNOSIS — D649 Anemia, unspecified: Secondary | ICD-10-CM

## 2012-11-28 DIAGNOSIS — R1310 Dysphagia, unspecified: Secondary | ICD-10-CM

## 2012-11-28 DIAGNOSIS — K219 Gastro-esophageal reflux disease without esophagitis: Secondary | ICD-10-CM

## 2012-11-28 LAB — CBC WITH DIFFERENTIAL/PLATELET
Basophils Relative: 0.4 % (ref 0.0–3.0)
HCT: 35.5 % — ABNORMAL LOW (ref 36.0–46.0)
Hemoglobin: 11.2 g/dL — ABNORMAL LOW (ref 12.0–15.0)
Lymphocytes Relative: 28.6 % (ref 12.0–46.0)
MCHC: 31.7 g/dL (ref 30.0–36.0)
Monocytes Relative: 10.2 % (ref 3.0–12.0)
Neutro Abs: 4.8 10*3/uL (ref 1.4–7.7)
RBC: 4.32 Mil/uL (ref 3.87–5.11)

## 2012-11-28 NOTE — Progress Notes (Addendum)
  Subjective:  Referred by: Nelwyn Salisbury, MD   Patient ID: Brandi Parker, female    DOB: 1956-10-19, 56 y.o.   MRN: [id number]  HPI This is a very pleasant middle-aged white woman here for evaluation of epigastric pain and dysphagia. She saw Dr. Clent Ridges recently and reported a several month history of intermittent severe epigastric pain that occur while leading with food impaction symptoms. She would wait and the food would pass. She was having heartburn and burning chest pain symptoms, lower chest and epigastric area in the mornings as well. She has not really had these problems before. Medications have not changed nor has her body habitus or any other situation. She drinks caffeinated beverage maybe once a day. All other GI review of systems are negative at this time.  Medications, allergies, past medical history, past surgical history, family history and social history are reviewed and updated in the EMR. Screening colonoscopy in 2010 and was negative Review of Systems Having left knee pain from a Baker's cyst and a meniscal tear, due for arthroscopy on December 19. Wears eyeglasses. Does have fatigue. Recently noted to be anemic which was new and started on iron.    Objective:   Physical Exam General:  Well-developed, well-nourished and in no acute distress Eyes:  anicteric. ENT:   Mouth and posterior pharynx free of lesions.  Neck:   supple w/o thyromegaly or mass.  Lungs: Clear to auscultation bilaterally. Heart:  S1S2, no rubs, murmurs, gallops. Abdomen:  soft, non-tender, no hepatosplenomegaly, hernia, or mass and BS+.  Lymph:  no cervical or supraclavicular adenopathy. Extremities:   no edema , decreased mobility and some pain with ROM in left knee Skin   no rash. Neuro:  A&O x 3.  Psych:  appropriate mood and  Affect.   Data Reviewed: 2010 colonoscopy     Assessment & Plan:   1. Dysphagia  Ambulatory referral to Gastroenterology  2. Epigastric pain    3. GERD  (gastroesophageal reflux disease)    4. Anemia, unspecified  CBC with Differential   1. EGD with possible dilation, CBC The risks and benefits as well as alternatives of endoscopic procedure(s) have been discussed and reviewed. All questions answered. The patient agrees to proceed. Further plans pending those results

## 2012-11-28 NOTE — Patient Instructions (Signed)
You have been scheduled for an endoscopy with propofol. Please follow written instructions given to you at your visit today. If you use inhalers (even only as needed) or a CPAP machine, please bring them with you on the day of your procedure.  Your physician has requested that you go to the basement for the following lab work before leaving today: CBC/diff  Thank you for choosing me and [facility].  Iva Boop, M.D., [facility]

## 2012-12-05 ENCOUNTER — Ambulatory Visit (AMBULATORY_SURGERY_CENTER): Payer: BC Managed Care – PPO | Admitting: Internal Medicine

## 2012-12-05 ENCOUNTER — Encounter: Payer: Self-pay | Admitting: Internal Medicine

## 2012-12-05 VITALS — BP 144/81 | HR 85 | Temp 97.0°F | Resp 16 | Ht 64.0 in | Wt 162.0 lb

## 2012-12-05 DIAGNOSIS — R1310 Dysphagia, unspecified: Secondary | ICD-10-CM

## 2012-12-05 MED ORDER — SODIUM CHLORIDE 0.9 % IV SOLN: 500.0000 mL | INTRAVENOUS | Status: DC

## 2012-12-05 NOTE — Progress Notes (Signed)
No complaints noted in the recovery room. Maw  Patient did not experience any of the following events: a burn prior to discharge; a fall within the facility; wrong site/side/patient/procedure/implant event; or a hospital transfer or hospital admission upon discharge from the facility. (G8907) Patient did not have preoperative order for IV antibiotic SSI prophylaxis. (G8918)  

## 2012-12-05 NOTE — Progress Notes (Signed)
Propofol given over incremental dosages 

## 2012-12-05 NOTE — Patient Instructions (Addendum)
The esophagus, stomach and duodenum (upper intestine) all look ok. I did dilate the esophagus because of your swallowing difficulty.  Stay on the omeprazole to prevent further problems, I think you should take it for the next 3 months and then you can see if you can stop it. You may need it long-term but before committing to that would try to stop it after several months. If problems return then restart - schedule follow-up if needed or if you are not better 1 month after this procedure today.  As you are aware you still need iron therapy for at least several months and you will need to follow-up with Dr. Clent Ridges about the anemia - you should get a complete blood count in February or March, I think.  Thank you for choosing me and [facility].  Iva Boop, MD, [facility]  You may resume your current medications today.  Please call if any questions or concerns.  Please follow the dilatation diet the rest of the day.     YOU HAD AN ENDOSCOPIC PROCEDURE TODAY AT THE [facility]: Refer to the procedure report that was given to you for any specific questions about what was found during the examination.  If the procedure report does not answer your questions, please call your gastroenterologist to clarify.  If you requested that your care partner not be given the details of your procedure findings, then the procedure report has been included in a sealed envelope for you to review at your convenience later.  YOU SHOULD EXPECT: Some feelings of bloating in the abdomen. Passage of more gas than usual.  Walking can help get rid of the air that was put into your GI tract during the procedure and reduce the bloating. If you had a lower endoscopy (such as a colonoscopy or flexible sigmoidoscopy) you may notice spotting of blood in your stool or on the toilet paper. If you underwent a bowel prep for your procedure, then you may not have a normal bowel movement for a few days.  DIET:  Drink  plenty of fluids but you should avoid alcoholic beverages for 24 hours.  Please follow the dilatation diet the rest of the day.  ACTIVITY: Your care partner should take you home directly after the procedure.  You should plan to take it easy, moving slowly for the rest of the day.  You can resume normal activity the day after the procedure however you should NOT DRIVE or use heavy machinery for 24 hours (because of the sedation medicines used during the test).    SYMPTOMS TO REPORT IMMEDIATELY: A gastroenterologist can be reached at any hour.  During normal business hours, 8:30 AM to 5:00 PM Monday through Friday, call [phone].  After hours and on weekends, please call the GI answering service at [phone] who will take a message and have the physician on call contact you.    Following upper endoscopy (EGD)  Vomiting of blood or coffee ground material  New chest pain or pain under the shoulder blades  Painful or persistently difficult swallowing  New shortness of breath  Fever of 100F or higher  Black, tarry-looking stools  FOLLOW UP: If any biopsies were taken you will be contacted by phone or by letter within the next 1-3 weeks.  Call your gastroenterologist if you have not heard about the biopsies in 3 weeks.  Our staff will call the home number listed on your records the next business day following your procedure  to check on you and address any questions or concerns that you may have at that time regarding the information given to you following your procedure. This is a courtesy call and so if there is no answer at the home number and we have not heard from you through the emergency physician on call, we will assume that you have returned to your regular daily activities without incident.  SIGNATURES/CONFIDENTIALITY: You and/or your care partner have signed paperwork which will be entered into your electronic medical record.  These signatures attest to the fact that that the  information above on your After Visit Summary has been reviewed and is understood.  Full responsibility of the confidentiality of this discharge information lies with you and/or your care-partner.

## 2012-12-05 NOTE — Op Note (Signed)
[facility] [address] [place], [address]   ENDOSCOPY PROCEDURE REPORT  PATIENT: Brandi, Parker  MR#: [id number] BIRTHDATE: [birth year] , 56  yrs. old GENDER: Female ENDOSCOPIST: Iva Boop, MD, [facility] PROCEDURE DATE:  12/05/2012 PROCEDURE:  EGD, diagnostic and Maloney dilation of esophagus ASA CLASS:     Class II INDICATIONS:  Dysphagia. MEDICATIONS: propofol (Diprivan) 200mg  IV, MAC sedation, administered by CRNA, and These medications were titrated to patient response per physician's verbal order TOPICAL ANESTHETIC: Cetacaine Spray  DESCRIPTION OF PROCEDURE: After the risks benefits and alternatives of the procedure were thoroughly explained, informed consent was obtained.  The LB [id number]-H180 [id number] endoscope was introduced through the mouth and advanced to the second portion of the duodenum. Without limitations.  The instrument was slowly withdrawn as the mucosa was fully examined.      The upper, middle and distal third of the esophagus were carefully inspected and no abnormalities were noted.  The z-line was well seen at the GEJ.  The endoscope was pushed into the fundus which was normal including a retroflexed view.  The antrum, gastric body, first and second part of the duodenum were unremarkable. Retroflexed views revealed no abnormalities.     The scope was then withdrawn from the patient, a 15 [place] Maloney dilator was passed without difficulty or heme (mild resistance) and the procedure completed.  COMPLICATIONS: There were no complications. ENDOSCOPIC IMPRESSION: Normal EGD - 52 french Maloney dilation performed to help dysphagia  RECOMMENDATIONS: 1.  Clear liquids starting at 5 and only until 6 PM, then soft foods rest of day.  Resume prior diet tomorrow. 2.   If not better in 1 month - call back for appointment 3.  Stay on iron and get CBC with Dr.  Clent Ridges Feb/March 2014 4.   If ok - stop omeprazole after 3 more months  (March/April 2014)and see how she does - may not need long-term as symptoms are relatively new.    eSigned:  Iva Boop, MD, [facility] 12/05/2012 4:11 PM   XB:[id number] Marguerita Beards, MD and The Patient

## 2012-12-06 ENCOUNTER — Telehealth: Payer: Self-pay

## 2012-12-06 NOTE — Telephone Encounter (Signed)
  Follow up Call-  Call back number 12/05/2012  Post procedure Call Back phone  # [phone]  Permission to leave phone message Yes     Patient questions:  Do you have a fever, pain , or abdominal swelling? no Pain Score  0 *  Have you tolerated food without any problems? yes  Have you been able to return to your normal activities? yes  Do you have any questions about your discharge instructions: Diet   no Medications  no Follow up visit  no  Do you have questions or concerns about your Care? no  Actions: * If pain score is 4 or above: No action needed, pain <4.  Per the pt she does have a scratchy sore throat.  I advised her to call us back if she does not improve. Maw

## 2012-12-19 ENCOUNTER — Telehealth: Payer: Self-pay | Admitting: Family Medicine

## 2012-12-19 NOTE — Telephone Encounter (Signed)
Pt needs new rx on methylphenidate (CONCERTA) 36 MG CR tablet. Usually gets (3) 30-day rx at one time. Please call when ready for pick up. She has 15 pills left and is aware that Dr. Clent Ridges is not back til Monday.

## 2012-12-25 MED ORDER — METHYLPHENIDATE HCL ER (OSM) 36 MG PO TBCR: 36.0000 mg | EXTENDED_RELEASE_TABLET | Freq: Every day | ORAL | Status: DC

## 2012-12-25 NOTE — Telephone Encounter (Signed)
done

## 2012-12-25 NOTE — Telephone Encounter (Signed)
Script is ready for pick up and I spoke with pt.  

## 2013-01-26 ENCOUNTER — Other Ambulatory Visit: Payer: Self-pay | Admitting: Family Medicine

## 2013-02-07 ENCOUNTER — Telehealth: Payer: Self-pay | Admitting: Family Medicine

## 2013-02-07 MED ORDER — BUPROPION HCL ER (XL) 150 MG PO TB24: 300.0000 mg | ORAL_TABLET | Freq: Every day | ORAL | Status: DC

## 2013-02-07 NOTE — Telephone Encounter (Signed)
Okay per Dr. Clent Ridges and I did send e-scribe.

## 2013-02-07 NOTE — Telephone Encounter (Signed)
Pharmacy no longer has Wellbutrin XL 300 mg, can we change to XL 150?

## 2013-03-26 ENCOUNTER — Telehealth: Payer: Self-pay | Admitting: Family Medicine

## 2013-03-26 MED ORDER — METHYLPHENIDATE HCL ER (OSM) 36 MG PO TBCR: 36.0000 mg | EXTENDED_RELEASE_TABLET | Freq: Every day | ORAL | Status: DC

## 2013-03-26 NOTE — Telephone Encounter (Signed)
done

## 2013-03-26 NOTE — Telephone Encounter (Signed)
Pt needs new rx generic concerta 36 mg °

## 2013-03-27 NOTE — Telephone Encounter (Signed)
Scripts are ready for pick up and left voice message for pt.

## 2013-03-29 ENCOUNTER — Other Ambulatory Visit: Payer: Self-pay | Admitting: Family Medicine

## 2013-05-11 ENCOUNTER — Telehealth: Payer: Self-pay | Admitting: Family Medicine

## 2013-05-11 MED ORDER — BUPROPION HCL ER (XL) 300 MG PO TB24: 300.0000 mg | ORAL_TABLET | ORAL | Status: DC

## 2013-05-11 NOTE — Telephone Encounter (Signed)
Refill request for Bupropion XL 300 mg take 1 po qd.

## 2013-05-11 NOTE — Telephone Encounter (Signed)
Okay for one year  

## 2013-05-11 NOTE — Telephone Encounter (Signed)
I sent script e-scribe. 

## 2013-08-27 ENCOUNTER — Telehealth: Payer: Self-pay | Admitting: Family Medicine

## 2013-08-27 NOTE — Telephone Encounter (Signed)
Pt is calling to request a 3 month supply of her methylphenidate (CONCERTA) 36 MG CR tablet. Please assist.

## 2013-08-28 MED ORDER — METHYLPHENIDATE HCL ER (OSM) 36 MG PO TBCR: 36.0000 mg | EXTENDED_RELEASE_TABLET | Freq: Every day | ORAL | Status: DC

## 2013-08-28 NOTE — Telephone Encounter (Signed)
Script is ready for pick up and I left a voice message.  

## 2013-08-28 NOTE — Telephone Encounter (Signed)
done

## 2013-09-27 ENCOUNTER — Telehealth: Payer: Self-pay | Admitting: Family Medicine

## 2013-09-27 NOTE — Telephone Encounter (Signed)
Patient Information:  Caller Name: Codie  Phone: [phone]  Patient: Brandi Parker  Gender: Female  DOB: 1956/10/21  Age: 57 Years  PCP: [facility])  Office Follow Up:  Does the office need to follow up with this patient?: Yes  Instructions For The Office: Pls read RN note  RN Note:  Depression worsen after change in manufacture of Wellbutrin.  Pt noticed change in mood after starting refill of Wellbutrin from another manufacture approx 1.5 to 2 weeks into RX.  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  Pt wanting to ask MD if any known problems w/ new manufacture. Pt will cry at anything.  Doesn't wish to change brand of med unless MD feels it's time but would like to know if sxs are normal w/ change of manufacture and will her body adjust or should she look for previous manfacuture at another pharmacy d/t brand Wellbutrin too costly. Please review w/ Dr Clent Ridges and f/u w/ Pt.  Symptoms  Reason For Call & Symptoms: Depression worsen after change in manufacture of Wellbutrin  Reviewed Health History In EMR: Yes  Reviewed Medications In EMR: Yes  Reviewed Allergies In EMR: Yes  Reviewed Surgeries / Procedures: Yes  Date of Onset of Symptoms: 08/27/2013  Treatments Tried: Generic Wellbutrin  Treatments Tried Worked: No  Guideline(s) Used:  Depression  Disposition Per Guideline:   Discuss with PCP and Callback by Nurse Today  Reason For Disposition Reached:   Started on anti-depressant medications > 2 weeks ago and not feeling any better  Advice Given:  N/A  Patient Will Follow Care Advice:  YES

## 2013-09-28 NOTE — Telephone Encounter (Signed)
Each generic manufacturer is allowed to put different amounts of the medication in each pill, even though it is the same medication. I suspect the new pills have less Wellbutrin in them. I suggest she look around for another pharmacy that may carry the old generic manufacturer

## 2013-09-28 NOTE — Telephone Encounter (Signed)
I spoke with pt and she is going to check around with other pharmacies.

## 2013-10-25 ENCOUNTER — Other Ambulatory Visit: Payer: Self-pay

## 2013-11-06 ENCOUNTER — Other Ambulatory Visit: Payer: Self-pay | Admitting: Family Medicine

## 2013-11-20 ENCOUNTER — Telehealth: Payer: Self-pay | Admitting: Family Medicine

## 2013-11-20 MED ORDER — METHYLPHENIDATE HCL ER (OSM) 36 MG PO TBCR: 36.0000 mg | EXTENDED_RELEASE_TABLET | Freq: Every day | ORAL | Status: DC

## 2013-11-20 NOTE — Telephone Encounter (Signed)
Done for one month only. She needs an OV for any more

## 2013-11-20 NOTE — Telephone Encounter (Signed)
Pt needs new rxs for generic concerta 27 mg #30. Pt would like 3 rxs for next 3 months

## 2013-11-21 NOTE — Telephone Encounter (Signed)
rx up front ready for p/u, pt aware 

## 2014-01-04 ENCOUNTER — Other Ambulatory Visit: Payer: BC Managed Care – PPO

## 2014-01-10 ENCOUNTER — Other Ambulatory Visit (INDEPENDENT_AMBULATORY_CARE_PROVIDER_SITE_OTHER): Payer: BC Managed Care – PPO

## 2014-01-10 DIAGNOSIS — Z0000 Encounter for general adult medical examination without abnormal findings: Secondary | ICD-10-CM

## 2014-01-10 LAB — POCT URINALYSIS DIPSTICK
Bilirubin, UA: NEGATIVE
Blood, UA: NEGATIVE
Glucose, UA: NEGATIVE
KETONES UA: NEGATIVE
LEUKOCYTES UA: NEGATIVE
Nitrite, UA: NEGATIVE
PH UA: 6
PROTEIN UA: NEGATIVE
Spec Grav, UA: >=1.03
UROBILINOGEN UA: 0.2

## 2014-01-11 LAB — CBC WITH DIFFERENTIAL/PLATELET
BASOS ABS: 0 10*3/uL (ref 0.0–0.1)
BASOS PCT: 0.3 % (ref 0.0–3.0)
EOS PCT: 2.1 % (ref 0.0–5.0)
Eosinophils Absolute: 0.2 10*3/uL (ref 0.0–0.7)
HEMATOCRIT: 39 % (ref 36.0–46.0)
HEMOGLOBIN: 12.8 g/dL (ref 12.0–15.0)
LYMPHS ABS: 2.7 10*3/uL (ref 0.7–4.0)
LYMPHS PCT: 28.2 % (ref 12.0–46.0)
MCHC: 32.8 g/dL (ref 30.0–36.0)
MCV: 80.8 fl (ref 78.0–100.0)
MONOS PCT: 8 % (ref 3.0–12.0)
Monocytes Absolute: 0.8 10*3/uL (ref 0.1–1.0)
NEUTROS ABS: 6 10*3/uL (ref 1.4–7.7)
Neutrophils Relative %: 61.4 % (ref 43.0–77.0)
Platelets: 290 10*3/uL (ref 150.0–400.0)
RBC: 4.82 Mil/uL (ref 3.87–5.11)
RDW: 14.4 % (ref 11.5–14.6)
WBC: 9.7 10*3/uL (ref 4.5–10.5)

## 2014-01-11 LAB — LIPID PANEL
Cholesterol: 178 mg/dL (ref 0–200)
HDL: 55.7 mg/dL (ref >39.00)
LDL CALC: 99 mg/dL (ref 0–99)
Total CHOL/HDL Ratio: 3
Triglycerides: 117 mg/dL (ref 0.0–149.0)
VLDL: 23.4 mg/dL (ref 0.0–40.0)

## 2014-01-11 LAB — HEPATIC FUNCTION PANEL
ALBUMIN: 4.3 g/dL (ref 3.5–5.2)
ALK PHOS: 85 U/L (ref 39–117)
ALT: 18 U/L (ref 0–35)
AST: 20 U/L (ref 0–37)
Bilirubin, Direct: 0 mg/dL (ref 0.0–0.3)
TOTAL PROTEIN: 7.3 g/dL (ref 6.0–8.3)
Total Bilirubin: 0.6 mg/dL (ref 0.3–1.2)

## 2014-01-11 LAB — BASIC METABOLIC PANEL
BUN: 21 mg/dL (ref 6–23)
CALCIUM: 10 mg/dL (ref 8.4–10.5)
CO2: 27 mEq/L (ref 19–32)
Chloride: 105 mEq/L (ref 96–112)
Creatinine, Ser: 1 mg/dL (ref 0.4–1.2)
GFR: 57.99 mL/min — AB (ref >60.00)
GLUCOSE: 81 mg/dL (ref 70–99)
POTASSIUM: 4.6 meq/L (ref 3.5–5.1)
SODIUM: 140 meq/L (ref 135–145)

## 2014-01-11 LAB — TSH: TSH: 0.72 u[IU]/mL (ref 0.35–5.50)

## 2014-01-14 ENCOUNTER — Encounter: Payer: Self-pay | Admitting: Family Medicine

## 2014-01-14 ENCOUNTER — Ambulatory Visit (INDEPENDENT_AMBULATORY_CARE_PROVIDER_SITE_OTHER): Payer: BC Managed Care – PPO | Admitting: Family Medicine

## 2014-01-14 VITALS — BP 124/72 | HR 91 | Temp 99.0°F | Ht 64.5 in | Wt 173.0 lb

## 2014-01-14 DIAGNOSIS — Z0000 Encounter for general adult medical examination without abnormal findings: Secondary | ICD-10-CM

## 2014-01-14 MED ORDER — OMEPRAZOLE 40 MG PO CPDR: DELAYED_RELEASE_CAPSULE | ORAL | Status: DC

## 2014-01-14 MED ORDER — METHYLPHENIDATE HCL ER (OSM) 36 MG PO TBCR: 36.0000 mg | EXTENDED_RELEASE_TABLET | Freq: Every day | ORAL | Status: DC

## 2014-01-14 MED ORDER — ELETRIPTAN HYDROBROMIDE 40 MG PO TABS: 40.0000 mg | ORAL_TABLET | ORAL | Status: DC | PRN

## 2014-01-14 MED ORDER — BUPROPION HCL ER (XL) 300 MG PO TB24: 300.0000 mg | ORAL_TABLET | ORAL | Status: DC

## 2014-01-14 MED ORDER — VALACYCLOVIR HCL 1 G PO TABS: 1000.0000 mg | ORAL_TABLET | ORAL | Status: DC | PRN

## 2014-01-14 NOTE — Progress Notes (Signed)
   Subjective:    Patient ID: Brandi Parker, female    DOB: [birth year], 58 y.o.   MRN: [id number]  HPI 58 yr old female for a cpx. She feels well although she knows she has put on 13 lbs in the past year.    Review of Systems  Constitutional: Negative.   HENT: Negative.   Eyes: Negative.   Respiratory: Negative.   Cardiovascular: Negative.   Gastrointestinal: Negative.   Genitourinary: Negative for dysuria, urgency, frequency, hematuria, flank pain, decreased urine volume, enuresis, difficulty urinating, pelvic pain and dyspareunia.  Musculoskeletal: Negative.   Skin: Negative.   Neurological: Negative.   Psychiatric/Behavioral: Negative.        Objective:   Physical Exam  Constitutional: She is oriented to person, place, and time. She appears well-developed and well-nourished. No distress.  HENT:  Head: Normocephalic and atraumatic.  Right Ear: External ear normal.  Left Ear: External ear normal.  Nose: Nose normal.  Mouth/Throat: Oropharynx is clear and moist. No oropharyngeal exudate.  Eyes: Conjunctivae and EOM are normal. Pupils are equal, round, and reactive to light. No scleral icterus.  Neck: Normal range of motion. Neck supple. No JVD present. No thyromegaly present.  Cardiovascular: Normal rate, regular rhythm, normal heart sounds and intact distal pulses.  Exam reveals no gallop and no friction rub.   No murmur heard. EKG normal   Pulmonary/Chest: Effort normal and breath sounds normal. No respiratory distress. She has no wheezes. She has no rales. She exhibits no tenderness.  Abdominal: Soft. Bowel sounds are normal. She exhibits no distension and no mass. There is no tenderness. There is no rebound and no guarding.  Musculoskeletal: Normal range of motion. She exhibits no edema and no tenderness.  Lymphadenopathy:    She has no cervical adenopathy.  Neurological: She is alert and oriented to person, place, and time. She has normal reflexes. No cranial nerve  deficit. She exhibits normal muscle tone. Coordination normal.  Skin: Skin is warm and dry. No rash noted. No erythema.  Psychiatric: She has a normal mood and affect. Her behavior is normal. Judgment and thought content normal.          Assessment & Plan:  Well exam.

## 2014-02-04 ENCOUNTER — Encounter: Payer: Self-pay | Admitting: Family Medicine

## 2014-03-19 ENCOUNTER — Other Ambulatory Visit (HOSPITAL_COMMUNITY): Payer: Self-pay | Admitting: Obstetrics and Gynecology

## 2014-03-19 DIAGNOSIS — Z1231 Encounter for screening mammogram for malignant neoplasm of breast: Secondary | ICD-10-CM

## 2014-03-27 ENCOUNTER — Ambulatory Visit (HOSPITAL_COMMUNITY)
Admission: RE | Admit: 2014-03-27 | Discharge: 2014-03-27 | Disposition: A | Discharge status: Home / Self Care | Payer: BC Managed Care – PPO | Source: Ambulatory Visit | Attending: Obstetrics and Gynecology | Admitting: Obstetrics and Gynecology

## 2014-03-27 DIAGNOSIS — Z1231 Encounter for screening mammogram for malignant neoplasm of breast: Secondary | ICD-10-CM | POA: Insufficient documentation

## 2014-05-29 ENCOUNTER — Telehealth: Payer: Self-pay | Admitting: Family Medicine

## 2014-05-29 MED ORDER — BUPROPION HCL ER (XL) 300 MG PO TB24: 300.0000 mg | ORAL_TABLET | ORAL | Status: DC

## 2014-05-29 MED ORDER — METHYLPHENIDATE HCL ER (OSM) 36 MG PO TBCR: 36.0000 mg | EXTENDED_RELEASE_TABLET | Freq: Every day | ORAL | Status: DC

## 2014-05-29 NOTE — Telephone Encounter (Signed)
I left a voice message for pt to return my call. Is pt changing pharmacies and is this for a 1 month supply or a 90 day supply?

## 2014-05-29 NOTE — Telephone Encounter (Signed)
I cancelled remaining refills at [facility] and sent new script e-scribe to below pharmacy. Pt prefers to get this medication from mail order and I did speak with pt to confirm this.

## 2014-05-29 NOTE — Telephone Encounter (Signed)
[address] is requesting re-fill on buPROPion (WELLBUTRIN XL) 300 MG 24 hr tablet

## 2014-05-29 NOTE — Telephone Encounter (Signed)
done

## 2014-05-29 NOTE — Telephone Encounter (Signed)
Script is ready for pick up and I spoke with pt.  

## 2014-05-29 NOTE — Telephone Encounter (Signed)
Refill request for Concerta 36 mg.

## 2014-09-06 ENCOUNTER — Telehealth: Payer: Self-pay | Admitting: Family Medicine

## 2014-09-06 MED ORDER — METHYLPHENIDATE HCL ER (OSM) 36 MG PO TBCR: 36.0000 mg | EXTENDED_RELEASE_TABLET | Freq: Every day | ORAL | Status: DC

## 2014-09-06 NOTE — Telephone Encounter (Signed)
done

## 2014-09-06 NOTE — Telephone Encounter (Signed)
Pt would like a re-fill on methylphenidate (CONCERTA) 36 MG PO CR tablet

## 2014-09-06 NOTE — Telephone Encounter (Signed)
Script is ready for pick up and I spoke with pt.  

## 2014-10-31 ENCOUNTER — Telehealth: Payer: Self-pay | Admitting: Family Medicine

## 2014-10-31 MED ORDER — METHYLPHENIDATE HCL ER (OSM) 36 MG PO TBCR: 36.0000 mg | EXTENDED_RELEASE_TABLET | Freq: Every day | ORAL | Status: DC

## 2014-10-31 NOTE — Telephone Encounter (Signed)
From now one we will fill only ONE month at a time

## 2014-10-31 NOTE — Telephone Encounter (Signed)
Patient would like a re-fill on methylphenidate (CONCERTA) 36 MG PO CR tablet. Patient states it okay to leave a message.  She says she doesn't know if she was given extra re-fills because she can't find the paper scripts.

## 2014-11-01 NOTE — Telephone Encounter (Signed)
Script is ready for pick up and I spoke with pt.  

## 2014-12-04 ENCOUNTER — Telehealth: Payer: Self-pay | Admitting: Family Medicine

## 2014-12-04 MED ORDER — METHYLPHENIDATE HCL ER (OSM) 36 MG PO TBCR: 36.0000 mg | EXTENDED_RELEASE_TABLET | Freq: Every day | ORAL | Status: DC

## 2014-12-04 NOTE — Telephone Encounter (Signed)
Patient would like a re-fill on methylphenidate (CONCERTA) 36 MG PO CR tablet.  I asked patient if she needed a Flu shot but she said that she thinks it was received at work.  She will check and if not, she will call to make an appointment.

## 2014-12-04 NOTE — Telephone Encounter (Signed)
done

## 2014-12-05 ENCOUNTER — Telehealth: Payer: Self-pay

## 2014-12-05 NOTE — Telephone Encounter (Signed)
Script is ready for pick up and I left a voice message.  

## 2014-12-05 NOTE — Telephone Encounter (Signed)
Rx request for Wellbutrin XL ER 300 mg- Take 1 tablet by mouth every monring #30  Pharm:  [facility]   Pls advise.

## 2014-12-06 MED ORDER — BUPROPION HCL ER (XL) 300 MG PO TB24: 300.0000 mg | ORAL_TABLET | ORAL | Status: DC

## 2014-12-06 NOTE — Telephone Encounter (Signed)
done

## 2015-01-15 ENCOUNTER — Other Ambulatory Visit: Payer: Self-pay | Admitting: Orthopaedic Surgery

## 2015-01-15 DIAGNOSIS — M25562 Pain in left knee: Secondary | ICD-10-CM

## 2015-01-27 ENCOUNTER — Ambulatory Visit
Admission: RE | Admit: 2015-01-27 | Discharge: 2015-01-27 | Disposition: A | Discharge status: Home / Self Care | Payer: BC Managed Care – PPO | Source: Ambulatory Visit | Attending: Orthopaedic Surgery | Admitting: Orthopaedic Surgery

## 2015-01-27 DIAGNOSIS — M25562 Pain in left knee: Secondary | ICD-10-CM

## 2015-02-14 NOTE — Telephone Encounter (Signed)
This encounter was created in error - please disregard.

## 2015-02-17 ENCOUNTER — Telehealth: Payer: Self-pay | Admitting: Family Medicine

## 2015-02-17 NOTE — Telephone Encounter (Signed)
Pt waiting on prior approval for her methylphenidate (CONCERTA) 36 MG PO CR tabl [facility]

## 2015-02-19 NOTE — Telephone Encounter (Signed)
PA approved and faxed to pharmacy:

## 2015-03-17 ENCOUNTER — Other Ambulatory Visit (HOSPITAL_COMMUNITY): Payer: Self-pay | Admitting: Obstetrics and Gynecology

## 2015-03-17 DIAGNOSIS — Z1231 Encounter for screening mammogram for malignant neoplasm of breast: Secondary | ICD-10-CM

## 2015-03-24 ENCOUNTER — Telehealth: Payer: Self-pay | Admitting: Family Medicine

## 2015-03-24 NOTE — Telephone Encounter (Signed)
Pt needs new rx generic concerta 36 mg

## 2015-03-27 MED ORDER — METHYLPHENIDATE HCL ER (OSM) 36 MG PO TBCR: 36.0000 mg | EXTENDED_RELEASE_TABLET | Freq: Every day | ORAL | Status: DC

## 2015-03-27 NOTE — Telephone Encounter (Signed)
Done for one month only. She needs an OV for any more refills

## 2015-03-27 NOTE — Telephone Encounter (Signed)
Script is ready for pick up and I spoke with pt.  

## 2015-03-31 ENCOUNTER — Ambulatory Visit (HOSPITAL_COMMUNITY)
Admission: RE | Admit: 2015-03-31 | Discharge: 2015-03-31 | Disposition: A | Discharge status: Home / Self Care | Payer: BC Managed Care – PPO | Source: Ambulatory Visit | Attending: Obstetrics and Gynecology | Admitting: Obstetrics and Gynecology

## 2015-03-31 DIAGNOSIS — Z1231 Encounter for screening mammogram for malignant neoplasm of breast: Secondary | ICD-10-CM

## 2015-04-04 ENCOUNTER — Other Ambulatory Visit (INDEPENDENT_AMBULATORY_CARE_PROVIDER_SITE_OTHER): Payer: BC Managed Care – PPO

## 2015-04-04 DIAGNOSIS — Z0000 Encounter for general adult medical examination without abnormal findings: Secondary | ICD-10-CM | POA: Diagnosis not present

## 2015-04-04 LAB — POCT URINALYSIS DIPSTICK
Bilirubin, UA: NEGATIVE
GLUCOSE UA: NEGATIVE
Ketones, UA: NEGATIVE
Leukocytes, UA: NEGATIVE
NITRITE UA: NEGATIVE
Protein, UA: NEGATIVE
RBC UA: NEGATIVE
Spec Grav, UA: 1.025
Urobilinogen, UA: 0.2
pH, UA: 7

## 2015-04-04 LAB — CBC WITH DIFFERENTIAL/PLATELET
Basophils Absolute: 0 10*3/uL (ref 0.0–0.1)
Basophils Relative: 0.3 % (ref 0.0–3.0)
Eosinophils Absolute: 0.4 10*3/uL (ref 0.0–0.7)
Eosinophils Relative: 4.2 % (ref 0.0–5.0)
HCT: 38.1 % (ref 36.0–46.0)
Hemoglobin: 12.5 g/dL (ref 12.0–15.0)
Lymphocytes Relative: 29.4 % (ref 12.0–46.0)
Lymphs Abs: 2.4 10*3/uL (ref 0.7–4.0)
MCHC: 32.9 g/dL (ref 30.0–36.0)
MCV: 79.3 fl (ref 78.0–100.0)
MONO ABS: 0.8 10*3/uL (ref 0.1–1.0)
Monocytes Relative: 9.1 % (ref 3.0–12.0)
Neutro Abs: 4.7 10*3/uL (ref 1.4–7.7)
Neutrophils Relative %: 57 % (ref 43.0–77.0)
PLATELETS: 335 10*3/uL (ref 150.0–400.0)
RBC: 4.8 Mil/uL (ref 3.87–5.11)
RDW: 15.1 % (ref 11.5–15.5)
WBC: 8.3 10*3/uL (ref 4.0–10.5)

## 2015-04-04 LAB — HEPATIC FUNCTION PANEL
ALT: 14 U/L (ref 0–35)
AST: 16 U/L (ref 0–37)
Albumin: 4.1 g/dL (ref 3.5–5.2)
Alkaline Phosphatase: 82 U/L (ref 39–117)
BILIRUBIN DIRECT: 0 mg/dL (ref 0.0–0.3)
Total Bilirubin: 0.3 mg/dL (ref 0.2–1.2)
Total Protein: 7.1 g/dL (ref 6.0–8.3)

## 2015-04-04 LAB — BASIC METABOLIC PANEL
BUN: 26 mg/dL — AB (ref 6–23)
CO2: 30 mEq/L (ref 19–32)
CREATININE: 1.17 mg/dL (ref 0.40–1.20)
Calcium: 9.9 mg/dL (ref 8.4–10.5)
Chloride: 105 mEq/L (ref 96–112)
GFR: 50.4 mL/min — AB (ref >60.00)
GLUCOSE: 105 mg/dL — AB (ref 70–99)
POTASSIUM: 5.2 meq/L — AB (ref 3.5–5.1)
Sodium: 142 mEq/L (ref 135–145)

## 2015-04-04 LAB — LIPID PANEL
Cholesterol: 170 mg/dL (ref 0–200)
HDL: 59.6 mg/dL (ref >39.00)
LDL Cholesterol: 94 mg/dL (ref 0–99)
NonHDL: 110.4
Total CHOL/HDL Ratio: 3
Triglycerides: 80 mg/dL (ref 0.0–149.0)
VLDL: 16 mg/dL (ref 0.0–40.0)

## 2015-04-04 LAB — TSH: TSH: 4.08 u[IU]/mL (ref 0.35–4.50)

## 2015-04-15 ENCOUNTER — Encounter: Payer: Self-pay | Admitting: Family Medicine

## 2015-04-15 ENCOUNTER — Ambulatory Visit (INDEPENDENT_AMBULATORY_CARE_PROVIDER_SITE_OTHER): Payer: BC Managed Care – PPO | Admitting: Family Medicine

## 2015-04-15 VITALS — BP 131/81 | HR 89 | Temp 98.8°F | Ht 64.25 in | Wt 172.0 lb

## 2015-04-15 DIAGNOSIS — G4733 Obstructive sleep apnea (adult) (pediatric): Secondary | ICD-10-CM | POA: Diagnosis not present

## 2015-04-15 DIAGNOSIS — G4731 Primary central sleep apnea: Secondary | ICD-10-CM | POA: Insufficient documentation

## 2015-04-15 DIAGNOSIS — Z0000 Encounter for general adult medical examination without abnormal findings: Secondary | ICD-10-CM

## 2015-04-15 MED ORDER — ELETRIPTAN HYDROBROMIDE 40 MG PO TABS: 40.0000 mg | ORAL_TABLET | ORAL | Status: DC | PRN

## 2015-04-15 MED ORDER — METHYLPHENIDATE HCL ER (OSM) 36 MG PO TBCR: 36.0000 mg | EXTENDED_RELEASE_TABLET | Freq: Every day | ORAL | Status: DC

## 2015-04-15 MED ORDER — VALACYCLOVIR HCL 1 G PO TABS: 1000.0000 mg | ORAL_TABLET | ORAL | Status: DC | PRN

## 2015-04-15 NOTE — Progress Notes (Signed)
   Subjective:    Patient ID: Brandi Parker, female    DOB: 07-13-1956, 59 y.o.   MRN: [id number]  HPI 59 yr old female for a cpx. She feels well. Her labs were remarkable for an elevated glucose of 105, and she is concerned about getting diabetes. She has a strong family hx of this including her mother. She has gained some weight in the last year and she thinks her sleep apnea is worse. She finds herself tired and sleepy all the time and her family says her snoring has gotten worse.    Review of Systems  Constitutional: Negative.   HENT: Negative.   Eyes: Negative.   Respiratory: Negative.   Cardiovascular: Negative.   Gastrointestinal: Negative.   Genitourinary: Negative for dysuria, urgency, frequency, hematuria, flank pain, decreased urine volume, enuresis, difficulty urinating, pelvic pain and dyspareunia.  Musculoskeletal: Negative.   Skin: Negative.   Neurological: Negative.   Psychiatric/Behavioral: Negative.        Objective:   Physical Exam  Constitutional: She is oriented to person, place, and time. She appears well-developed and well-nourished. No distress.  HENT:  Head: Normocephalic and atraumatic.  Right Ear: External ear normal.  Left Ear: External ear normal.  Nose: Nose normal.  Mouth/Throat: Oropharynx is clear and moist. No oropharyngeal exudate.  Eyes: Conjunctivae and EOM are normal. Pupils are equal, round, and reactive to light. No scleral icterus.  Neck: Normal range of motion. Neck supple. No JVD present. No thyromegaly present.  Cardiovascular: Normal rate, regular rhythm, normal heart sounds and intact distal pulses.  Exam reveals no gallop and no friction rub.   No murmur heard. EKG normal   Pulmonary/Chest: Effort normal and breath sounds normal. No respiratory distress. She has no wheezes. She has no rales. She exhibits no tenderness.  Abdominal: Soft. Bowel sounds are normal. She exhibits no distension and no mass. There is no tenderness. There  is no rebound and no guarding.  Musculoskeletal: Normal range of motion. She exhibits no edema or tenderness.  Lymphadenopathy:    She has no cervical adenopathy.  Neurological: She is alert and oriented to person, place, and time. She has normal reflexes. No cranial nerve deficit. She exhibits normal muscle tone. Coordination normal.  Skin: Skin is warm and dry. No rash noted. No erythema.  Psychiatric: She has a normal mood and affect. Her behavior is normal. Judgment and thought content normal.          Assessment & Plan:  Well exam. We will refer her back to Dr. Annamaria Boots doe the sleep apnea. She needs to exercise and lose weight. We will check an A1c in 6 months.

## 2015-04-15 NOTE — Progress Notes (Signed)
Pre visit review using our clinic review tool, if applicable. No additional management support is needed unless otherwise documented below in the visit note. 

## 2015-04-24 ENCOUNTER — Encounter: Payer: Self-pay | Admitting: Family Medicine

## 2015-06-17 ENCOUNTER — Ambulatory Visit (INDEPENDENT_AMBULATORY_CARE_PROVIDER_SITE_OTHER): Payer: BC Managed Care – PPO | Admitting: Pulmonary Disease

## 2015-06-17 ENCOUNTER — Encounter: Payer: Self-pay | Admitting: Pulmonary Disease

## 2015-06-17 VITALS — BP 148/92 | HR 83 | Ht 64.75 in | Wt 171.8 lb

## 2015-06-17 DIAGNOSIS — G4733 Obstructive sleep apnea (adult) (pediatric): Secondary | ICD-10-CM | POA: Diagnosis not present

## 2015-06-17 NOTE — Progress Notes (Signed)
Chief Complaint  Patient presents with  . SLEEP CONSULT    Referred by Dr Sarajane Jews. Sleep Study x 12-13 years ago. Epworth Score: 9    History of Present Illness: Brandi Parker is a 59 y.o. female for evaluation of sleep problems.  She had a sleep study about 12 years ago.  She was told her sleep apnea number was 17.  She was tried on CPAP >> felt too bulky.  As a result she has never been on therapy for sleep apnea.  Her sleep has been getting worse.  She is snoring more and wakes up hearing herself snore.  Her boyfriend has told her she stops breathing while asleep.  She can fall asleep while watching a movie or riding in a car.  She sometimes grinds her teeth at night.  She goes to sleep at 11 pm.  She falls asleep after 10 minutes usually, but sometimes can take up to an hour.  She wakes up 6 to 7 times to use the bathroom.  She gets out of bed at 730 am.  She feels groggy sometimes in the morning.  She denies morning headache.  She does not use anything to help her fall sleep.  She drinks tea during the day.  She denies sleep walking, sleep talking, or nightmares.  There is no history of restless legs.  She denies sleep hallucinations, sleep paralysis, or cataplexy.  The Epworth score is 9 out of 24.  Phoebe Sharps Gerding  has a past medical history of Allergy; Anemia; Depression; Chicken pox; Migraines; Nephrolithiasis; Urinary incontinence; Menopausal syndrome; Brain injury (1981); Fracture, toe; Fracture, ankle; Sleep apnea syndrome; ADHD (attention deficit hyperactivity disorder); and Osteopenia (01/24/09).  Lorina Duffner Dowty  has past surgical history that includes Tonsillectomy; Dilation and curettage of uterus; Repair nonunion / malunion metatarsal / tarsal bones; Colonoscopy (10/14/09); and Knee arthroscopy (12-16-11).  Prior to Admission medications   Medication Sig Start Date End Date Taking? Authorizing Provider  buPROPion (WELLBUTRIN XL) 300 MG 24 hr tablet Take 1 tablet (300 mg  total) by mouth every morning. Name brand only 12/06/14  Yes Laurey Morale, MD  Calcium Carbonate 1500 MG TABS Take by mouth daily.     Yes Historical Provider, MD  Cholecalciferol (VITAMIN D) 2000 UNITS tablet Take 2,000 Units by mouth daily.     Yes Historical Provider, MD  eletriptan (RELPAX) 40 MG tablet Take 1 tablet (40 mg total) by mouth as needed for migraine. may repeat in 2 hours if necessary 04/15/15  Yes Laurey Morale, MD  estradiol (VIVELLE-DOT) 0.025 MG/24HR Place 1 patch onto the skin once a week.    Yes Historical Provider, MD  methylphenidate (CONCERTA) 36 MG PO CR tablet Take 1 tablet (36 mg total) by mouth daily. 04/15/15 04/14/16 Yes Laurey Morale, MD  naproxen sodium (ANAPROX) 220 MG tablet Take 440 mg by mouth as needed.    Yes Historical Provider, MD  omeprazole (PRILOSEC) 40 MG capsule TAKE ONE CAPSULE BY MOUTH DAILY 01/14/14  Yes Laurey Morale, MD  progesterone (PROMETRIUM) 100 MG capsule Take 100 mg by mouth daily.     Yes Historical Provider, MD  valACYclovir (VALTREX) 1000 MG tablet Take 1 tablet (1,000 mg total) by mouth as needed. 04/15/15  Yes Laurey Morale, MD    Allergies  Allergen Reactions  . Penicillins     REACTION: anaphylaxis  . Sulfamethoxazole-Trimethoprim     REACTION: rash    Her family history includes Cancer  in her father and father; Diabetes in her mother; Hashimoto's thyroiditis in her daughter and another family member; Hyperlipidemia in her mother; Hypertension in her mother; Prostate cancer in her father; Stroke in her mother.  She  reports that she has never smoked. She has never used smokeless tobacco. She reports that she drinks alcohol. She reports that she does not use illicit drugs.  Review of Systems  Constitutional: Negative for fever and unexpected weight change.  HENT: Negative for congestion, dental problem, ear pain, nosebleeds, postnasal drip, rhinorrhea, sinus pressure, sneezing, sore throat and trouble swallowing.   Eyes: Negative  for redness and itching.  Respiratory: Negative for cough, chest tightness, shortness of breath and wheezing.   Cardiovascular: Negative for palpitations and leg swelling.  Gastrointestinal: Negative for nausea and vomiting.  Genitourinary: Negative for dysuria.  Musculoskeletal: Negative for joint swelling.  Skin: Negative for rash.  Neurological: Negative for headaches.  Hematological: Does not bruise/bleed easily.  Psychiatric/Behavioral: Negative for dysphoric mood. The patient is not nervous/anxious.    Physical Exam: BP 148/92 mmHg  Pulse 83  Ht 5' 4.75" (1.645 m)  Wt 171 lb 12.8 oz (77.928 kg)  BMI 28.80 kg/m2  SpO2 99%  General - No distress ENT - No sinus tenderness, no oral exudate, no LAN, no thyromegaly, TM clear, pupils equal/reactive, MP 4, enlarged tongue, retrognathic, micrognathic Cardiac - s1s2 regular, no murmur, pulses symmetric Chest - No wheeze/rales/dullness, good air entry, normal respiratory excursion Back - No focal tenderness Abd - Soft, non-tender, no organomegaly, + bowel sounds Ext - No edema Neuro - Normal strength, cranial nerves intact Skin - No rashes Psych - Normal mood, and behavior  Discussion: She has snoring, sleep disruption, witnessed apnea, and daytime sleepiness.  She has hx of depression, and prior hx of sleep apnea.  I am concerned she still has sleep apnea.  We discussed how sleep apnea can affect various health problems including risks for hypertension, cardiovascular disease, and diabetes.  We also discussed how sleep disruption can increase risks for accident, such as while driving.  Weight loss as a means of improving sleep apnea was also reviewed.  Additional treatment options discussed were CPAP therapy, oral appliance, and surgical intervention.   Assessment/plan:  Obstructive sleep apnea. Plan: - will arrange for in lab sleep study - she might be a good candidate for oral appliance depending on severity of her sleep  apnea   Chesley Mires, M.D. Pager [phone]

## 2015-06-17 NOTE — Patient Instructions (Signed)
Will arrange for sleep study Will call to arrange for follow up after sleep study reviewed 

## 2015-06-17 NOTE — Progress Notes (Deleted)
   Subjective:    Patient ID: Brandi Parker, female    DOB: 1956-06-20, 59 y.o.   MRN: [id number]  HPI    Review of Systems  Constitutional: Negative for fever and unexpected weight change.  HENT: Negative for congestion, dental problem, ear pain, nosebleeds, postnasal drip, rhinorrhea, sinus pressure, sneezing, sore throat and trouble swallowing.   Eyes: Negative for redness and itching.  Respiratory: Negative for cough, chest tightness, shortness of breath and wheezing.   Cardiovascular: Negative for palpitations and leg swelling.  Gastrointestinal: Negative for nausea and vomiting.  Genitourinary: Negative for dysuria.  Musculoskeletal: Negative for joint swelling.  Skin: Negative for rash.  Neurological: Negative for headaches.  Hematological: Does not bruise/bleed easily.  Psychiatric/Behavioral: Negative for dysphoric mood. The patient is not nervous/anxious.        Objective:   Physical Exam        Assessment & Plan:

## 2015-08-05 ENCOUNTER — Other Ambulatory Visit: Payer: Self-pay | Admitting: Family Medicine

## 2015-08-05 NOTE — Telephone Encounter (Signed)
Pt request refill of the following: methylphenidate (CONCERTA) 36 MG PO CR tablet  Pt req 90 day rx     Phamacy:

## 2015-08-06 MED ORDER — METHYLPHENIDATE HCL ER (OSM) 36 MG PO TBCR: 36.0000 mg | EXTENDED_RELEASE_TABLET | Freq: Every day | ORAL | Status: DC

## 2015-08-06 NOTE — Telephone Encounter (Signed)
done

## 2015-08-06 NOTE — Telephone Encounter (Signed)
Called and spoke with pt and pt is aware.  

## 2015-09-08 ENCOUNTER — Ambulatory Visit (HOSPITAL_BASED_OUTPATIENT_CLINIC_OR_DEPARTMENT_OTHER): Payer: BC Managed Care – PPO | Attending: Pulmonary Disease

## 2015-09-08 DIAGNOSIS — G4739 Other sleep apnea: Secondary | ICD-10-CM | POA: Diagnosis not present

## 2015-09-08 DIAGNOSIS — G4733 Obstructive sleep apnea (adult) (pediatric): Secondary | ICD-10-CM | POA: Diagnosis present

## 2015-09-16 ENCOUNTER — Telehealth: Payer: Self-pay | Admitting: Pulmonary Disease

## 2015-09-16 DIAGNOSIS — G4733 Obstructive sleep apnea (adult) (pediatric): Secondary | ICD-10-CM | POA: Diagnosis not present

## 2015-09-16 DIAGNOSIS — G4731 Primary central sleep apnea: Secondary | ICD-10-CM

## 2015-09-16 NOTE — Progress Notes (Signed)
Patient Name: Brandi Parker, Brandi Parker Date: 09/08/2015 Gender: Female D.O.B: 12-18-1956 Age (years): 81 Referring Provider: Chesley Mires MD, ABSM Height (inches): 65 Interpreting Physician: Chesley Mires MD, ABSM Weight (lbs): 165 RPSGT: Madelon Lips BMI: 27 MRN: [id number] Neck Size: 13.00  CLINICAL INFORMATION Sleep Study Type: Split Night CPAP  Indication for sleep study: OSA  Epworth Sleepiness Score: 11  SLEEP STUDY TECHNIQUE As per the AASM Manual for the Scoring of Sleep and Associated Events v2.3 (April 2016) with a hypopnea requiring 4% desaturations.  The channels recorded and monitored were frontal, central and occipital EEG, electrooculogram (EOG), submentalis EMG (chin), nasal and oral airflow, thoracic and abdominal wall motion, anterior tibialis EMG, snore microphone, electrocardiogram, and pulse oximetry. Continuous positive airway pressure (CPAP) was initiated when the patient met split night criteria and was titrated according to treat sleep-disordered breathing.  MEDICATIONS Medications taken by the patient : reviewed in electronic medical record. Medications administered by patient during sleep study : No sleep medicine administered.  RESPIRATORY PARAMETERS Diagnostic  Total AHI (/hr): 40.6 RDI (/hr): 53.8 OA Index (/hr): 3.6 CA Index (/hr): 15.3 REM AHI (/hr): N/A NREM AHI (/hr): 40.6 Supine AHI (/hr): 69.2 Non-supine AHI (/hr): 38.72 Min O2 Sat (%): 88.00 Mean O2 (%): 95.99 Time below 88% (min): 0.0     Titration  Optimal Pressure (cm): 9 cm H2O AHI at Optimal Pressure (/hr): 2.5 Min O2 at Optimal Pressure (%): 90.00 SLEEP ARCHITECTURE The recording time for the entire night was 404.5 minutes.  During a baseline period of 211.8 minutes, the patient slept for 168.5 minutes in REM and nonREM, yielding a sleep efficiency of 79.5%. Sleep onset after lights out was 5.3 minutes with a REM latency of N/A minutes. The patient spent 26.71% of the night in stage N1  sleep, 60.53% in stage N2 sleep, 12.76% in stage N3 and 0.00% in REM.  During the titration period of 187.7 minutes, the patient slept for 163.7 minutes in REM and nonREM, yielding a sleep efficiency of 87.2%. Sleep onset after CPAP initiation was 4.0 minutes with a REM latency of 5.0 minutes. The patient spent 16.50% of the night in stage N1 sleep, 51.73% in stage N2 sleep, 17.41% in stage N3 and 14.36% in REM.  CARDIAC DATA The 2 lead EKG demonstrated sinus rhythm. The mean heart rate was 73.32 beats per minute. Other EKG findings include: None.  LEG MOVEMENT DATA The total Periodic Limb Movements of Sleep (PLMS) were 0. The PLMS index was 0.00 .  IMPRESSIONS This study shows severe complex sleep apnea (AHI 40.6, CAI 15.3).  She developed central apneas during titration portion at lower CPAP settings which resolved with CPAP 9 cm H2O.  DIAGNOSIS Complex Sleep Apnea (327.23 [G47.33 ICD-10])  RECOMMENDATIONS CPAP 9 cm H2O.   Chesley Mires, MD [facility] Pulmonary/Critical Care 09/16/2015, 9:15 AM Pager:  [phone] After 3pm call: [phone]

## 2015-09-16 NOTE — Telephone Encounter (Signed)
PSG 09/08/15 >> AHI 40.6, SpO2 low 88%, CAI 15.3.  CPAP 9 cm H2O >> AHI 2.5   Will have my nurse inform pt that sleep study shows severe sleep apnea.  Options are 1) CPAP now, 2) ROV first.  If She is agreeable to CPAP, then please send order for CPAP 9 cn H2O with heated humidity and mask of choice.  Have download sent 1 month after starting CPAP and set up ROV 2 months after starting CPAP.

## 2015-09-17 NOTE — Telephone Encounter (Signed)
lmomtcb x1 

## 2015-09-17 NOTE — Telephone Encounter (Signed)
lmtcb

## 2015-09-17 NOTE — Telephone Encounter (Signed)
[phone], pt cb

## 2015-09-18 NOTE — Telephone Encounter (Signed)
Called and spoke with pt. Reviewed results and recs Pt stated that she would like to start CPAP now and follow up in 2 months I scheduled her on 11/25/15 @ 4:00pm per pt's request. Order for CPAP was placed.  And specific instructions on when pt can receive calls was stated in order. On 09/19/15 between 1-5pm and every other weekday after 3pm Pt voiced understanding and had no further questions Nothing further needed

## 2015-11-10 ENCOUNTER — Telehealth: Payer: Self-pay | Admitting: Family Medicine

## 2015-11-10 NOTE — Telephone Encounter (Signed)
Pt needs new rx generic concerta 36 mg [id number]

## 2015-11-12 MED ORDER — METHYLPHENIDATE HCL ER (OSM) 36 MG PO TBCR: 36.0000 mg | EXTENDED_RELEASE_TABLET | Freq: Every day | ORAL | Status: DC

## 2015-11-12 NOTE — Telephone Encounter (Signed)
done

## 2015-11-12 NOTE — Telephone Encounter (Signed)
Rx is ready for pick up. Pt is aware  

## 2015-11-25 ENCOUNTER — Encounter: Payer: Self-pay | Admitting: Adult Health

## 2015-11-25 ENCOUNTER — Ambulatory Visit (INDEPENDENT_AMBULATORY_CARE_PROVIDER_SITE_OTHER): Payer: BC Managed Care – PPO | Admitting: Adult Health

## 2015-11-25 VITALS — BP 122/74 | HR 88 | Ht 65.0 in | Wt 180.2 lb

## 2015-11-25 DIAGNOSIS — G4733 Obstructive sleep apnea (adult) (pediatric): Secondary | ICD-10-CM | POA: Diagnosis not present

## 2015-11-25 DIAGNOSIS — Z9989 Dependence on other enabling machines and devices: Secondary | ICD-10-CM

## 2015-11-25 DIAGNOSIS — G4731 Primary central sleep apnea: Secondary | ICD-10-CM

## 2015-11-25 DIAGNOSIS — G4737 Central sleep apnea in conditions classified elsewhere: Secondary | ICD-10-CM

## 2015-11-25 NOTE — Assessment & Plan Note (Addendum)
Severe OSA with good control on CPAP   Plan  You are doing great on your CPAP. You are benefiting from the therapy, as you apnea events have dropped to 3.4/ hr. Please do not drive if you are sleepy.  Continue to work on your weight loss Follow up with Dr. Halford Chessman in 3 months or as needed . We will set you up with a patient care coordinator to evaluate your cost of therapy.

## 2015-11-25 NOTE — Progress Notes (Signed)
Subjective:    Patient ID: Brandi Parker, female    DOB: 09-Mar-1956, 59 y.o.   MRN: [id number]  HPI: 59 year old female with complex sleep apnea syndrome.  TEST: PSG done 09/08/15>>> AHI: 40.6 , SAO2 low of 88%   11/25/15 Follow-up visit: OSA: Pt. Recently seen for a sleep consult. Set up for split night sleep study .  PSG done on 09/08/15 with AHI at 40.6. She was started on CPAP .  She has been on CPAP since October. She is using a friends machine that is at set pressure of 9 cm H2O. She returns for follow up today and is doing well on her CPAP. Great compliance, average usage is 6 hours daily. Feels rested.  Download : 10/17/15-11/24/15, shows excellent compliance with average usage of 6 hours.She is using nasal pillows with good results. AHI 3.4.   Past Medical History  Diagnosis Date  . Allergy   . Anemia   . Depression   . Chicken pox   . Migraines   . Nephrolithiasis   . Urinary incontinence   . Menopausal syndrome   . Brain injury ([place]) 1981    fell off a desk chair  . Fracture, toe   . Fracture, ankle   . Sleep apnea syndrome     could not tolerate CPAP  . ADHD (attention deficit hyperactivity disorder)   . Osteopenia 01/24/09    dexa scan   Current Outpatient Prescriptions on File Prior to Visit  Medication Sig Dispense Refill  . buPROPion (WELLBUTRIN XL) 300 MG 24 hr tablet Take 1 tablet (300 mg total) by mouth every morning. Name brand only 90 tablet 3  . Calcium Carbonate 1500 MG TABS Take by mouth daily.      . Cholecalciferol (VITAMIN D) 2000 UNITS tablet Take 2,000 Units by mouth daily.      Marland Kitchen eletriptan (RELPAX) 40 MG tablet Take 1 tablet (40 mg total) by mouth as needed for migraine. may repeat in 2 hours if necessary 6 tablet 11  . estradiol (VIVELLE-DOT) 0.025 MG/24HR Place 1 patch onto the skin once a week.     . methylphenidate (CONCERTA) 36 MG PO CR tablet Take 1 tablet (36 mg total) by mouth daily. 90 tablet 0  . naproxen sodium (ANAPROX) 220 MG  tablet Take 440 mg by mouth as needed.     Marland Kitchen omeprazole (PRILOSEC) 40 MG capsule TAKE ONE CAPSULE BY MOUTH DAILY 30 capsule 11  . progesterone (PROMETRIUM) 100 MG capsule Take 100 mg by mouth daily.      . valACYclovir (VALTREX) 1000 MG tablet Take 1 tablet (1,000 mg total) by mouth as needed. 30 tablet 11   No current facility-administered medications on file prior to visit.     Review of Systems  Constitutional:   No  weight loss, night sweats,  Fevers, chills, fatigue, or  lassitude.  HEENT:   No headaches,  Difficulty swallowing,  Tooth/dental problems, or  Sore throat,                No sneezing, itching, ear ache, nasal congestion, post nasal drip,   CV:  No chest pain,  Orthopnea, PND, swelling in lower extremities, anasarca, dizziness, palpitations, syncope.   GI  No heartburn, indigestion, abdominal pain, nausea, vomiting, diarrhea, change in bowel habits, loss of appetite, bloody stools.   Resp: No shortness of breath with exertion or at rest.  No excess mucus, no productive cough,  No non-productive cough,  No  coughing up of blood.  No change in color of mucus.  No wheezing.  No chest wall deformity  Skin: no rash or lesions.  GU: no dysuria, change in color of urine, no urgency or frequency.  No flank pain, no hematuria   MS:  No joint pain or swelling.  No decreased range of motion.  No back pain.  Psych:  No change in mood or affect. No depression or anxiety.  No memory loss.           Objective:   Physical Exam  GEN: A/Ox3; pleasant , NAD, overweight   HEENT:  [place]/AT,  EACs-clear, TMs-wnl, NOSE-clear, THROAT-clear, no lesions, no postnasal drip or exudate noted. Class 2 MP airway   NECK:  Supple w/ fair ROM; no JVD; normal carotid impulses w/o bruits; no thyromegaly or nodules palpated; no lymphadenopathy.  RESP  Clear  P & A; w/o, wheezes/ rales/ or rhonchi.no accessory muscle use, no dullness to percussion  CARD:  RRR, no m/r/g  , no peripheral edema,  pulses intact, no cyanosis or clubbing.  GI:   Soft & nt; nml bowel sounds; no organomegaly or masses detected.  Musco: Warm bil, no deformities or joint swelling noted.   Neuro: alert, no focal deficits noted.    Skin: Warm, no lesions or rashes        Assessment & Plan:

## 2015-11-25 NOTE — Patient Instructions (Addendum)
You are doing great on your CPAP. You are benefiting from the therapy, as you apnea events have dropped to 3.4/ hr. Please do not drive if you are sleepy.  Continue to work on your weight loss Follow up with Dr. Halford Chessman in 3 months or as needed . We will set you up with a patient care coordinator to evaluate your cost of therapy.

## 2015-11-26 NOTE — Progress Notes (Signed)
Reviewed and agree with assessment/plan. 

## 2015-12-18 ENCOUNTER — Other Ambulatory Visit: Payer: Self-pay | Admitting: Family Medicine

## 2015-12-18 MED ORDER — BUPROPION HCL ER (XL) 300 MG PO TB24: 300.0000 mg | ORAL_TABLET | ORAL | Status: DC

## 2015-12-18 NOTE — Telephone Encounter (Signed)
Please refill for one year  

## 2015-12-18 NOTE — Telephone Encounter (Signed)
Pt request refill of the following: buPROPion (WELLBUTRIN XL) 300 MG 24 hr tablet   Phamacy: [facility]   [phone]

## 2015-12-18 NOTE — Telephone Encounter (Signed)
Rx sent to pharmacy   

## 2015-12-19 ENCOUNTER — Telehealth: Payer: Self-pay | Admitting: Family Medicine

## 2015-12-19 NOTE — Telephone Encounter (Signed)
I called the number below and pt can sign up on line and print out the savings card. I spoke with pt and gave this information.

## 2015-12-19 NOTE — Telephone Encounter (Signed)
Pt is on wellbutrin and needs name brand only and would like for nurse to call valeant at [phone] to requesting discount saving cards so she can saving money.

## 2016-02-23 ENCOUNTER — Ambulatory Visit (INDEPENDENT_AMBULATORY_CARE_PROVIDER_SITE_OTHER): Payer: BC Managed Care – PPO | Admitting: Adult Health

## 2016-02-23 ENCOUNTER — Encounter: Payer: Self-pay | Admitting: Adult Health

## 2016-02-23 VITALS — BP 124/72 | HR 88 | Temp 97.4°F | Ht 65.0 in | Wt 163.0 lb

## 2016-02-23 DIAGNOSIS — G4737 Central sleep apnea in conditions classified elsewhere: Secondary | ICD-10-CM | POA: Diagnosis not present

## 2016-02-23 DIAGNOSIS — G4733 Obstructive sleep apnea (adult) (pediatric): Secondary | ICD-10-CM

## 2016-02-23 DIAGNOSIS — G4731 Primary central sleep apnea: Secondary | ICD-10-CM

## 2016-02-23 NOTE — Progress Notes (Signed)
Subjective:    Patient ID: Brandi Parker, female    DOB: 18-Dec-1956, 60 y.o.   MRN: [id number]  HPI: 60 year old female with complex sleep apnea syndrome.  TEST: PSG done 09/08/15>>> AHI: 40.6 , SAO2 low of 88%   02/23/2016  Follow-up visit: OSA: Pt returns for follow up for sleep apnea. She has been on CPAP since October.   Download reviewed with pt  : 2/1-3/2 , shows good  compliance with average usage of 5 hours.She is using nasal pillows with good results. AHI 3.3 Feels rested.  Still working with DME on cost issues with insurance.  Denies chest pain, orthopnea, edema .   Past Medical History  Diagnosis Date  . Allergy   . Anemia   . Depression   . Chicken pox   . Migraines   . Nephrolithiasis   . Urinary incontinence   . Menopausal syndrome   . Brain injury ([place]) 1981    fell off a desk chair  . Fracture, toe   . Fracture, ankle   . Sleep apnea syndrome     could not tolerate CPAP  . ADHD (attention deficit hyperactivity disorder)   . Osteopenia 01/24/09    dexa scan   Current Outpatient Prescriptions on File Prior to Visit  Medication Sig Dispense Refill  . buPROPion (WELLBUTRIN XL) 300 MG 24 hr tablet Take 1 tablet (300 mg total) by mouth every morning. Name brand only 90 tablet 3  . Calcium Carbonate 1500 MG TABS Take by mouth daily.      . Cholecalciferol (VITAMIN D) 2000 UNITS tablet Take 2,000 Units by mouth daily.      Marland Kitchen eletriptan (RELPAX) 40 MG tablet Take 1 tablet (40 mg total) by mouth as needed for migraine. may repeat in 2 hours if necessary 6 tablet 11  . estradiol (VIVELLE-DOT) 0.025 MG/24HR Place 1 patch onto the skin once a week.     . methylphenidate (CONCERTA) 36 MG PO CR tablet Take 1 tablet (36 mg total) by mouth daily. 90 tablet 0  . omeprazole (PRILOSEC) 40 MG capsule TAKE ONE CAPSULE BY MOUTH DAILY 30 capsule 11  . progesterone (PROMETRIUM) 100 MG capsule Take 100 mg by mouth daily.      . valACYclovir (VALTREX) 1000 MG tablet Take 1 tablet  (1,000 mg total) by mouth as needed. 30 tablet 11   No current facility-administered medications on file prior to visit.     Review of Systems  Constitutional:   No  weight loss, night sweats,  Fevers, chills, fatigue, or  lassitude.  HEENT:   No headaches,  Difficulty swallowing,  Tooth/dental problems, or  Sore throat,                No sneezing, itching, ear ache, nasal congestion, post nasal drip,   CV:  No chest pain,  Orthopnea, PND, swelling in lower extremities, anasarca, dizziness, palpitations, syncope.   GI  No heartburn, indigestion, abdominal pain, nausea, vomiting, diarrhea, change in bowel habits, loss of appetite, bloody stools.   Resp: No shortness of breath with exertion or at rest.  No excess mucus, no productive cough,  No non-productive cough,  No coughing up of blood.  No change in color of mucus.  No wheezing.  No chest wall deformity  Skin: no rash or lesions.  GU: no dysuria, change in color of urine, no urgency or frequency.  No flank pain, no hematuria   MS:  No joint pain or  swelling.  No decreased range of motion.  No back pain.  Psych:  No change in mood or affect. No depression or anxiety.  No memory loss.           Objective:   Physical Exam Filed Vitals:   02/23/16 1615  BP: 124/72  Pulse: 88  Temp: 97.4 F (36.3 C)  TempSrc: Oral  Height: 5\' 5"  (1.651 m)  Weight: 163 lb (73.936 kg)  SpO2: 97%   Body mass index is 27.12 kg/(m^2).  GEN: A/Ox3; pleasant , NAD, overweight   HEENT:  [place]/AT,  EACs-clear, TMs-wnl, NOSE-clear, THROAT-clear, no lesions, no postnasal drip or exudate noted. Class 2 MP airway   NECK:  Supple w/ fair ROM; no JVD; normal carotid impulses w/o bruits; no thyromegaly or nodules palpated; no lymphadenopathy.  RESP  Clear  P & A; w/o, wheezes/ rales/ or rhonchi.no accessory muscle use, no dullness to percussion  CARD:  RRR, no m/r/g  , no peripheral edema, pulses intact, no cyanosis or clubbing.  GI:   Soft &  nt; nml bowel sounds; no organomegaly or masses detected.  Musco: Warm bil, no deformities or joint swelling noted.   Neuro: alert, no focal deficits noted.    Skin: Warm, no lesions or rashes        Assessment & Plan:

## 2016-02-23 NOTE — Progress Notes (Signed)
Reviewed and agree with assessment/plan. 

## 2016-02-23 NOTE — Assessment & Plan Note (Signed)
Doing well on CPAP   Plan  You are doing great on your CPAP. Please do not drive if you are sleepy.  Continue to work on your weight loss Follow up with Dr. Halford Chessman in 6  months or as needed . We will set you up with a patient care coordinator to evaluate your cost of therapy.

## 2016-02-23 NOTE — Patient Instructions (Signed)
You are doing great on your CPAP. Please do not drive if you are sleepy.  Continue to work on your weight loss Follow up with Dr. Halford Chessman in 6  months or as needed . We will set you up with a patient care coordinator to evaluate your cost of therapy.

## 2016-03-10 ENCOUNTER — Telehealth: Payer: Self-pay | Admitting: Family Medicine

## 2016-03-10 MED ORDER — METHYLPHENIDATE HCL ER (OSM) 36 MG PO TBCR: 36.0000 mg | EXTENDED_RELEASE_TABLET | Freq: Every day | ORAL | Status: DC

## 2016-03-10 NOTE — Telephone Encounter (Signed)
Pt needs new rx concerta 36 mg 0000000 for 2 month supply.

## 2016-03-10 NOTE — Telephone Encounter (Signed)
done

## 2016-03-10 NOTE — Telephone Encounter (Signed)
Pt last visit 02/23/16 Pt last refill 11/12/15 #90

## 2016-03-11 NOTE — Telephone Encounter (Signed)
Rx is ready for pick up and pt is aware  

## 2016-03-18 ENCOUNTER — Telehealth: Payer: Self-pay

## 2016-03-18 NOTE — Telephone Encounter (Signed)
Methylphenidate prior auth approved through [email or web address] Faxe should be received about approval

## 2016-03-23 ENCOUNTER — Encounter: Payer: Self-pay | Admitting: Internal Medicine

## 2016-04-15 ENCOUNTER — Other Ambulatory Visit: Payer: Self-pay | Admitting: Family Medicine

## 2016-04-16 NOTE — Telephone Encounter (Signed)
Ok to refill 

## 2016-05-31 ENCOUNTER — Telehealth: Payer: Self-pay | Admitting: Family Medicine

## 2016-05-31 NOTE — Telephone Encounter (Signed)
Ok to refill 

## 2016-05-31 NOTE — Telephone Encounter (Signed)
Pt request refill of the following:   methylphenidate (CONCERTA) 36 MG PO CR tablet  Pt said she a 2 need month [place]    [facility]:  [facility]

## 2016-06-02 MED ORDER — METHYLPHENIDATE HCL ER (OSM) 36 MG PO TBCR: 36.0000 mg | EXTENDED_RELEASE_TABLET | Freq: Every day | ORAL | Status: DC

## 2016-06-02 NOTE — Telephone Encounter (Signed)
Patient notified that Rx is ready for pick up.

## 2016-06-02 NOTE — Telephone Encounter (Signed)
done

## 2016-06-04 ENCOUNTER — Telehealth: Payer: Self-pay | Admitting: Family Medicine

## 2016-06-04 MED ORDER — BUPROPION HCL ER (XL) 300 MG PO TB24: 300.0000 mg | ORAL_TABLET | ORAL | Status: DC

## 2016-06-04 MED ORDER — BUPROPION HCL ER (XL) 300 MG PO TB24: 300.0000 mg | ORAL_TABLET | Freq: Every day | ORAL | Status: DC

## 2016-06-04 NOTE — Telephone Encounter (Signed)
I sent script e-scribe and spoke with pt. 

## 2016-06-04 NOTE — Telephone Encounter (Signed)
This is already generic, I dont know why they gave her a name brand. Call in one year supply and please specify generic

## 2016-06-04 NOTE — Telephone Encounter (Signed)
Pt insurance will not pay for  buPROPion (WELLBUTRIN XL) 300 MG 24 hr tablet   She is asking for the GENERIC    Phamacy:   [facility]

## 2016-08-10 ENCOUNTER — Telehealth: Payer: Self-pay | Admitting: Family Medicine

## 2016-08-10 NOTE — Telephone Encounter (Signed)
Pt need new Concerta w/3 refills

## 2016-08-11 MED ORDER — METHYLPHENIDATE HCL ER (OSM) 36 MG PO TBCR: 36.0000 mg | EXTENDED_RELEASE_TABLET | Freq: Every day | ORAL | 0 refills | Status: DC

## 2016-08-11 NOTE — Telephone Encounter (Signed)
Done for one month only. She needs an OV soon  

## 2016-08-12 NOTE — Telephone Encounter (Signed)
Pt aware.

## 2016-10-27 ENCOUNTER — Telehealth: Payer: Self-pay | Admitting: Family Medicine

## 2016-10-27 NOTE — Telephone Encounter (Signed)
Which medication ?

## 2016-10-27 NOTE — Telephone Encounter (Signed)
Pt want a refill for 2 mo .Please call when ready

## 2016-11-03 MED ORDER — METHYLPHENIDATE HCL ER (OSM) 36 MG PO TBCR: 36.0000 mg | EXTENDED_RELEASE_TABLET | Freq: Every day | ORAL | 0 refills | Status: DC

## 2016-11-03 NOTE — Telephone Encounter (Signed)
done

## 2016-11-03 NOTE — Telephone Encounter (Signed)
The medication is Concerta 36mg 

## 2016-11-03 NOTE — Addendum Note (Signed)
Addended by: Alysia Penna A on: 11/03/2016 05:57 PM   Modules accepted: Orders

## 2016-11-04 NOTE — Telephone Encounter (Signed)
Script is ready for pick up here at front office and I spoke with pt.  

## 2016-11-08 ENCOUNTER — Encounter: Payer: Self-pay | Admitting: Family Medicine

## 2016-11-08 ENCOUNTER — Ambulatory Visit (INDEPENDENT_AMBULATORY_CARE_PROVIDER_SITE_OTHER): Payer: BC Managed Care – PPO | Admitting: Family Medicine

## 2016-11-08 VITALS — BP 133/86 | HR 80 | Temp 98.9°F

## 2016-11-08 DIAGNOSIS — M26609 Unspecified temporomandibular joint disorder, unspecified side: Secondary | ICD-10-CM

## 2016-11-08 DIAGNOSIS — Z23 Encounter for immunization: Secondary | ICD-10-CM

## 2016-11-08 DIAGNOSIS — M26659 Arthropathy of unspecified temporomandibular joint: Secondary | ICD-10-CM | POA: Insufficient documentation

## 2016-11-08 MED ORDER — DICLOFENAC SODIUM 75 MG PO TBEC: 75.0000 mg | DELAYED_RELEASE_TABLET | Freq: Two times a day (BID) | ORAL | 2 refills | Status: DC

## 2016-11-08 NOTE — Progress Notes (Signed)
Pre visit review using our clinic review tool, if applicable. No additional management support is needed unless otherwise documented below in the visit note. Pt declined to weigh 

## 2016-11-08 NOTE — Addendum Note (Signed)
Addended by: Aggie Hacker A on: 11/08/2016 04:58 PM   Modules accepted: Orders

## 2016-11-08 NOTE — Progress Notes (Signed)
   Subjective:    Patient ID: Brandi Parker, female    DOB: Mar 04, 1956, 60 y.o.   MRN: [id number]  HPI Here for 6 months of intermittent pain in the right jaw. No hx of trauma, but the pain started after a vacation trip where she used different pillows to sleep than she usually does. She has cracking in the jaw and it hurts to open her mouth wide. She does not chew gum.    Review of Systems  Constitutional: Negative.   HENT: Positive for dental problem. Negative for congestion, ear pain, facial swelling and sinus pain.   Eyes: Negative.   Respiratory: Negative.        Objective:   Physical Exam  Constitutional: She is oriented to person, place, and time. She appears well-developed and well-nourished. No distress.  HENT:  Right Ear: External ear normal.  Left Ear: External ear normal.  Nose: Nose normal.  Mouth/Throat: Oropharynx is clear and moist.  Tender over right TMJ with crepitus   Eyes: Conjunctivae are normal.  Neck: Neck supple. No thyromegaly present.  Lymphadenopathy:    She has no cervical adenopathy.  Neurological: She is alert and oriented to person, place, and time.          Assessment & Plan:  TMJ pain. Try Voltaren 75 mg bid. Advised her to see her dentist ASAP.  Laurey Morale, MD

## 2017-01-31 ENCOUNTER — Telehealth: Payer: Self-pay | Admitting: Family Medicine

## 2017-01-31 NOTE — Telephone Encounter (Signed)
Pt needs new rx generic concerta 36 mg 0000000

## 2017-02-01 MED ORDER — METHYLPHENIDATE HCL ER (OSM) 36 MG PO TBCR: 36.0000 mg | EXTENDED_RELEASE_TABLET | Freq: Every day | ORAL | 0 refills | Status: DC

## 2017-02-01 NOTE — Telephone Encounter (Signed)
Script is ready for pick up here at front office and I spoke with pt.  

## 2017-02-01 NOTE — Telephone Encounter (Signed)
done

## 2017-06-14 ENCOUNTER — Encounter: Payer: Self-pay | Admitting: Pulmonary Disease

## 2017-06-14 ENCOUNTER — Other Ambulatory Visit: Payer: Self-pay | Admitting: Family Medicine

## 2017-06-14 ENCOUNTER — Ambulatory Visit (INDEPENDENT_AMBULATORY_CARE_PROVIDER_SITE_OTHER): Payer: BC Managed Care – PPO | Admitting: Pulmonary Disease

## 2017-06-14 VITALS — BP 122/82 | HR 80 | Ht 64.0 in | Wt 166.4 lb

## 2017-06-14 DIAGNOSIS — Z9989 Dependence on other enabling machines and devices: Secondary | ICD-10-CM | POA: Diagnosis not present

## 2017-06-14 DIAGNOSIS — G4733 Obstructive sleep apnea (adult) (pediatric): Secondary | ICD-10-CM

## 2017-06-14 MED ORDER — BUPROPION HCL ER (XL) 300 MG PO TB24: 300.0000 mg | ORAL_TABLET | Freq: Every day | ORAL | 0 refills | Status: DC

## 2017-06-14 NOTE — Telephone Encounter (Signed)
Ok to refill for 30 days  

## 2017-06-14 NOTE — Progress Notes (Signed)
Current Outpatient Prescriptions on File Prior to Visit  Medication Sig  . buPROPion (WELLBUTRIN XL) 300 MG 24 hr tablet Take 1 tablet (300 mg total) by mouth daily.  . Calcium Carbonate 1500 MG TABS Take by mouth daily.    . Cholecalciferol (VITAMIN D) 2000 UNITS tablet Take 2,000 Units by mouth daily.    . diclofenac (VOLTAREN) 75 MG EC tablet Take 1 tablet (75 mg total) by mouth 2 (two) times daily. (Patient not taking: Reported on 06/14/2017)  . eletriptan (RELPAX) 40 MG tablet Take 1 tablet (40 mg total) by mouth as needed for migraine. may repeat in 2 hours if necessary  . estradiol (VIVELLE-DOT) 0.025 MG/24HR Place 1 patch onto the skin once a week.   . methylphenidate (CONCERTA) 36 MG PO CR tablet Take 1 tablet (36 mg total) by mouth daily.  . Naproxen Sodium (ALEVE) 220 MG CAPS Take 2 capsules by mouth daily.  Marland Kitchen omeprazole (PRILOSEC) 40 MG capsule TAKE ONE CAPSULE BY MOUTH DAILY  . progesterone (PROMETRIUM) 100 MG capsule Take 100 mg by mouth daily.    . valACYclovir (VALTREX) 1000 MG tablet TAKE 1 TABLET BY MOUTH AS NEEDED   No current facility-administered medications on file prior to visit.      Chief Complaint  Patient presents with  . Follow-up    follow up on OSA. Pt wears CPAP 4-5 hours per night, keeps waking up at 4am. Patient is using extra small peices, and not fitting well if there are other options other than the mask. DME: [facility]     Sleep tests PSG 09/08/15 >> AHI 40.6, SpO2 low 88%, CAI 15.3, CPAP 9 cm H2O >> AHI 2.5 CPAP 05/13/17 to 06/11/17 >> used on 21 of 30 nights with average 5 hrs 34 min.  Average AHI 1.7 with CPAP 9 cm H2O  Past medical history ADHD, Allergies, Depression, Migraines, Nephrolithiasis, Osteopenia  Past surgical history, Family history, Social history, Allergies all reviewed.  Vital Signs BP 122/82 (BP Location: Right Arm, Cuff Size: Normal)   Pulse 80   Ht 5\' 4"  (1.626 m)   Wt 166 lb 6.4 oz (75.5 kg)   SpO2 100%   BMI 28.56 kg/m    History of Present Illness Brandi Parker is a 61 y.o. female with obstructive sleep apnea.  She has been using CPAP on regular basis.  She is using nasal pillows.  She wakes up at 4 am to use bathroom.  Otherwise feels rested.  She is followed by Dr. Evelene Croon for dentistry.  She was told she could be an oral appliance candidate and is scheduled for screening with Dr. Evelene Croon.  She does c/o TMJ.  She had trouble with [facility]'s billing dept and hasn't worked with them for the past year.   Physical Exam  General - No distress ENT - No sinus tenderness, no oral exudate, no LAN, MP 4, scalloped tongue, click with jaw open at TMJ Cardiac - s1s2 regular, no murmur Chest - No wheeze/rales/dullness Back - No focal tenderness Abd - Soft, non-tender Ext - No edema Neuro - Normal strength Skin - No rashes Psych - normal mood, and behavior   Assessment/Plan  Obstructive sleep apnea. - she is compliant with CPAP and reports benefit from CPAP - had lengthy discussion with her about pros/cons of oral appliance >> concern with her TMJ and using this - discussed options for travel CPAPs - discussed options for CPAP masks  Times spent 26 minutes   Patient Instructions  Call if you want to try changing to an oral appliance for sleep apnea  Call if you want to try a different mask, a different home care company, or get a prescription for a travel CPAP machine  Follow up in 1 year    Chesley Mires, MD [place] Pager:  [phone] 06/14/2017, 9:32 AM

## 2017-06-14 NOTE — Patient Instructions (Signed)
Call if you want to try changing to an oral appliance for sleep apnea  Call if you want to try a different mask, a different home care company, or get a prescription for a travel CPAP machine  Follow up in 1 year

## 2017-07-15 ENCOUNTER — Other Ambulatory Visit: Payer: Self-pay | Admitting: Family Medicine

## 2017-09-08 ENCOUNTER — Encounter: Payer: Self-pay | Admitting: Family Medicine

## 2017-09-08 ENCOUNTER — Telehealth: Payer: Self-pay | Admitting: Family Medicine

## 2017-09-08 NOTE — Telephone Encounter (Signed)
°*  STAT* If patient is at the pharmacy, call can be transferred to refill team.   1. Which medications need to be refilled? (please list name of each medication and dose if known)  concerta 36 mg po cr tablet once daily  2. Which pharmacy/location (including street and city if local pharmacy) is medication to be sent to? [address] [place], [place] [address]  3. Do they need a 30 day or 90 day supply?  90 day supply

## 2017-09-09 MED ORDER — METHYLPHENIDATE HCL ER (OSM) 36 MG PO TBCR: 36.0000 mg | EXTENDED_RELEASE_TABLET | Freq: Every day | ORAL | 0 refills | Status: DC

## 2017-09-09 NOTE — Telephone Encounter (Signed)
Done

## 2017-09-09 NOTE — Telephone Encounter (Signed)
Script is ready for pick up here at front office and I spoke with pt.  

## 2017-11-30 ENCOUNTER — Other Ambulatory Visit: Payer: Self-pay | Admitting: Occupational Medicine

## 2017-11-30 ENCOUNTER — Ambulatory Visit: Payer: Self-pay

## 2017-11-30 DIAGNOSIS — M25561 Pain in right knee: Secondary | ICD-10-CM

## 2017-12-15 ENCOUNTER — Telehealth: Payer: Self-pay | Admitting: Pulmonary Disease

## 2017-12-15 DIAGNOSIS — G4731 Primary central sleep apnea: Secondary | ICD-10-CM

## 2017-12-15 DIAGNOSIS — G4739 Other sleep apnea: Secondary | ICD-10-CM

## 2017-12-15 NOTE — Telephone Encounter (Signed)
Please let her know that insurance will likely not pay for new CPAP if her current device is < 61 years old.  If she would like to purchase a machine on her own, then I can write a script for a new machine.

## 2017-12-15 NOTE — Telephone Encounter (Signed)
Called and spoke with pt, who request order for new cpap. Pt states she would like to get new machine from [email or web address]  VS please advise if okay to order and settings.

## 2017-12-15 NOTE — Telephone Encounter (Signed)
Called spoke with patient to discuss VS' response Pt stated she does not want/need a new machine - she would like a new mask (her last mask was when she first began CPAP in October 2016)  [employer] [email or web address] [place]: [phone]  Ext [phone] F: [phone]  Order placed Nothing further needed Will sign off and forward to VS to make him aware pt only needs a new CPAP mask

## 2017-12-15 NOTE — Telephone Encounter (Signed)
Okay to send order for new CPAP mask. 

## 2018-01-12 ENCOUNTER — Telehealth: Payer: Self-pay | Admitting: Family Medicine

## 2018-01-12 NOTE — Telephone Encounter (Signed)
Copied from [place] [id number]. Topic: Quick Communication - Rx Refill/Question >> Jan 12, 2018  3:56 PM Arletha Grippe wrote: Medication: methylphenidate (CONCERTA) 36 MG PO CR tablet   Has the patient contacted their pharmacy? No. Pick up  (Agent: If no, request that the patient contact the pharmacy for the refill.)   Preferred Pharmacy (with phone number or street name): call [phone] when ready . She is requesting 2 months of rx's    Agent: Please be advised that RX refills may take up to 3 business days. We ask that you follow-up with your pharmacy.

## 2018-01-13 MED ORDER — METHYLPHENIDATE HCL ER (OSM) 36 MG PO TBCR: 36.0000 mg | EXTENDED_RELEASE_TABLET | Freq: Every day | ORAL | 0 refills | Status: DC

## 2018-01-13 NOTE — Telephone Encounter (Signed)
Done for one month only. She needs an OV soon

## 2018-01-13 NOTE — Telephone Encounter (Signed)
Called and spoke with pt. Pt advised and voiced understanding.  

## 2018-01-13 NOTE — Telephone Encounter (Signed)
Last OV 11/08/2016  Rx was last refilled 02/01/2017 disp 90 with no refills.  Sent to PCP

## 2018-01-31 ENCOUNTER — Telehealth: Payer: Self-pay | Admitting: Pulmonary Disease

## 2018-01-31 NOTE — Telephone Encounter (Signed)
Spoke with Brandi Parker and advised VS will take care of this tomorrow. Will route to [facility] for reminder.

## 2018-02-02 NOTE — Telephone Encounter (Signed)
Spoke with Vida Roller, who states she has not received surgical clearance.  I have spoken with Brandi Parker, who states she has contacted [facility] and requested that they refax forms.  Will route to [facility]

## 2018-02-02 NOTE — Telephone Encounter (Signed)
Patient called and wanted Kelli to know that she has spoke with [facility] and they have received the report they needed. She doesn't need anything else- if a return call is necessary she can be reached at [phone]

## 2018-02-15 ENCOUNTER — Telehealth: Payer: Self-pay | Admitting: Family Medicine

## 2018-02-15 MED ORDER — METHYLPHENIDATE HCL ER (OSM) 36 MG PO TBCR: 36.0000 mg | EXTENDED_RELEASE_TABLET | Freq: Every day | ORAL | 0 refills | Status: DC

## 2018-02-15 NOTE — Telephone Encounter (Signed)
Last OV: 11/08/2016, upcoming CPE in March 2019 Last filled: 01/13/18, #30, 0 RF OMB:TDHR 1 tablet (36 mg total) by mouth daily UDS: 04/04/15, positive for methylphenidate, low risk

## 2018-02-15 NOTE — Telephone Encounter (Signed)
Done

## 2018-02-15 NOTE — Telephone Encounter (Signed)
Patient needs a refill on Concerta.  Pharmacy: [facility] on [address]

## 2018-02-16 NOTE — Telephone Encounter (Signed)
Left message notifying patient.

## 2018-02-22 HISTORY — PX: KNEE ARTHROSCOPY: SUR90

## 2018-03-01 ENCOUNTER — Ambulatory Visit (INDEPENDENT_AMBULATORY_CARE_PROVIDER_SITE_OTHER): Payer: BC Managed Care – PPO | Admitting: Family Medicine

## 2018-03-01 ENCOUNTER — Encounter: Payer: Self-pay | Admitting: Family Medicine

## 2018-03-01 VITALS — BP 112/66 | HR 84 | Temp 98.4°F | Ht 64.5 in | Wt 164.6 lb

## 2018-03-01 DIAGNOSIS — Z0000 Encounter for general adult medical examination without abnormal findings: Secondary | ICD-10-CM

## 2018-03-01 LAB — CBC WITH DIFFERENTIAL/PLATELET
Basophils Absolute: 0.1 10*3/uL (ref 0.0–0.1)
Basophils Relative: 0.8 % (ref 0.0–3.0)
EOS ABS: 0.3 10*3/uL (ref 0.0–0.7)
Eosinophils Relative: 4.3 % (ref 0.0–5.0)
HCT: 41.3 % (ref 36.0–46.0)
HEMOGLOBIN: 13.4 g/dL (ref 12.0–15.0)
Lymphocytes Relative: 24.7 % (ref 12.0–46.0)
Lymphs Abs: 1.8 10*3/uL (ref 0.7–4.0)
MCHC: 32.4 g/dL (ref 30.0–36.0)
MCV: 81.5 fl (ref 78.0–100.0)
MONO ABS: 0.5 10*3/uL (ref 0.1–1.0)
Monocytes Relative: 7.6 % (ref 3.0–12.0)
Neutro Abs: 4.5 10*3/uL (ref 1.4–7.7)
Neutrophils Relative %: 62.6 % (ref 43.0–77.0)
Platelets: 331 10*3/uL (ref 150.0–400.0)
RBC: 5.07 Mil/uL (ref 3.87–5.11)
RDW: 15.3 % (ref 11.5–15.5)
WBC: 7.1 10*3/uL (ref 4.0–10.5)

## 2018-03-01 LAB — HEPATIC FUNCTION PANEL
ALK PHOS: 83 U/L (ref 39–117)
ALT: 16 U/L (ref 0–35)
AST: 18 U/L (ref 0–37)
Albumin: 4.2 g/dL (ref 3.5–5.2)
BILIRUBIN DIRECT: 0.1 mg/dL (ref 0.0–0.3)
BILIRUBIN TOTAL: 0.5 mg/dL (ref 0.2–1.2)
Total Protein: 6.8 g/dL (ref 6.0–8.3)

## 2018-03-01 LAB — BASIC METABOLIC PANEL
BUN: 24 mg/dL — ABNORMAL HIGH (ref 6–23)
CHLORIDE: 104 meq/L (ref 96–112)
CO2: 29 mEq/L (ref 19–32)
Calcium: 9.8 mg/dL (ref 8.4–10.5)
Creatinine, Ser: 0.96 mg/dL (ref 0.40–1.20)
GFR: 62.71 mL/min (ref >60.00)
Glucose, Bld: 96 mg/dL (ref 70–99)
POTASSIUM: 4.3 meq/L (ref 3.5–5.1)
Sodium: 140 mEq/L (ref 135–145)

## 2018-03-01 LAB — POC URINALSYSI DIPSTICK (AUTOMATED)
Bilirubin, UA: NEGATIVE
Glucose, UA: NEGATIVE
Ketones, UA: NEGATIVE
Leukocytes, UA: NEGATIVE
Nitrite, UA: NEGATIVE
PH UA: 6 (ref 5.0–8.0)
PROTEIN UA: NEGATIVE
RBC UA: NEGATIVE
UROBILINOGEN UA: 0.2 U/dL

## 2018-03-01 LAB — LIPID PANEL
CHOL/HDL RATIO: 3
Cholesterol: 162 mg/dL (ref 0–200)
HDL: 50.6 mg/dL (ref >39.00)
LDL Cholesterol: 91 mg/dL (ref 0–99)
NONHDL: 111.64
TRIGLYCERIDES: 105 mg/dL (ref 0.0–149.0)
VLDL: 21 mg/dL (ref 0.0–40.0)

## 2018-03-01 LAB — TSH: TSH: 1.87 u[IU]/mL (ref 0.35–4.50)

## 2018-03-01 MED ORDER — BUPROPION HCL ER (XL) 300 MG PO TB24: 300.0000 mg | ORAL_TABLET | Freq: Every day | ORAL | 3 refills | Status: DC

## 2018-03-01 MED ORDER — ELETRIPTAN HYDROBROMIDE 40 MG PO TABS: 40.0000 mg | ORAL_TABLET | ORAL | 11 refills | Status: DC | PRN

## 2018-03-01 MED ORDER — VALACYCLOVIR HCL 1 G PO TABS: 1000.0000 mg | ORAL_TABLET | ORAL | 11 refills | Status: DC | PRN

## 2018-03-01 NOTE — Progress Notes (Signed)
   Subjective:    Patient ID: Brandi Parker, female    DOB: 1956/06/05, 62 y.o.   MRN: [id number]  HPI Here for a well exam. She is doing well in general. She recently fell at work and tore a meniscus in the right knee. One week ago she had arthroscopy to repair this and she is recovering as expected.    Review of Systems  Constitutional: Negative.   HENT: Negative.   Eyes: Negative.   Respiratory: Negative.   Cardiovascular: Negative.   Gastrointestinal: Negative.   Genitourinary: Negative for decreased urine volume, difficulty urinating, dyspareunia, dysuria, enuresis, flank pain, frequency, hematuria, pelvic pain and urgency.  Musculoskeletal: Negative.   Skin: Negative.   Neurological: Negative.   Psychiatric/Behavioral: Negative.        Objective:   Physical Exam  Constitutional: She is oriented to person, place, and time. She appears well-developed and well-nourished. No distress.  Using crutches   HENT:  Head: Normocephalic and atraumatic.  Right Ear: External ear normal.  Left Ear: External ear normal.  Nose: Nose normal.  Mouth/Throat: Oropharynx is clear and moist. No oropharyngeal exudate.  Eyes: Conjunctivae and EOM are normal. Pupils are equal, round, and reactive to light. No scleral icterus.  Neck: Normal range of motion. Neck supple. No JVD present. No thyromegaly present.  Cardiovascular: Normal rate, regular rhythm, normal heart sounds and intact distal pulses. Exam reveals no gallop and no friction rub.  No murmur heard. Pulmonary/Chest: Effort normal and breath sounds normal. No respiratory distress. She has no wheezes. She has no rales. She exhibits no tenderness.  Abdominal: Soft. Bowel sounds are normal. She exhibits no distension and no mass. There is no tenderness. There is no rebound and no guarding.  Musculoskeletal: Normal range of motion. She exhibits no edema or tenderness.  Lymphadenopathy:    She has no cervical adenopathy.  Neurological: She  is alert and oriented to person, place, and time. She has normal reflexes. No cranial nerve deficit. She exhibits normal muscle tone. Coordination normal.  Skin: Skin is warm and dry. No rash noted. No erythema.  Psychiatric: She has a normal mood and affect. Her behavior is normal. Judgment and thought content normal.          Assessment & Plan:  Well exam. We discussed diet and exercise. Get fasting labs.  Alysia Penna, MD

## 2018-03-28 ENCOUNTER — Encounter (INDEPENDENT_AMBULATORY_CARE_PROVIDER_SITE_OTHER): Payer: Self-pay | Admitting: Orthopaedic Surgery

## 2018-03-28 ENCOUNTER — Ambulatory Visit (INDEPENDENT_AMBULATORY_CARE_PROVIDER_SITE_OTHER): Payer: BC Managed Care – PPO | Admitting: Orthopaedic Surgery

## 2018-03-28 VITALS — BP 114/84 | HR 89 | Resp 18 | Ht 64.5 in | Wt 165.0 lb

## 2018-03-28 DIAGNOSIS — M25562 Pain in left knee: Secondary | ICD-10-CM

## 2018-03-28 DIAGNOSIS — M542 Cervicalgia: Secondary | ICD-10-CM
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
## 2018-04-10 ENCOUNTER — Other Ambulatory Visit (INDEPENDENT_AMBULATORY_CARE_PROVIDER_SITE_OTHER): Payer: Self-pay | Admitting: *Deleted

## 2018-04-10 ENCOUNTER — Encounter (INDEPENDENT_AMBULATORY_CARE_PROVIDER_SITE_OTHER): Payer: Self-pay | Admitting: Orthopaedic Surgery

## 2018-04-10 ENCOUNTER — Ambulatory Visit (INDEPENDENT_AMBULATORY_CARE_PROVIDER_SITE_OTHER): Payer: BC Managed Care – PPO | Admitting: Orthopaedic Surgery

## 2018-04-10 VITALS — BP 122/96 | HR 80 | Resp 16 | Ht 64.5 in | Wt 165.0 lb

## 2018-04-10 DIAGNOSIS — M25562 Pain in left knee: Secondary | ICD-10-CM

## 2018-04-10 DIAGNOSIS — M542 Cervicalgia: Secondary | ICD-10-CM | POA: Diagnosis not present

## 2018-04-10 DIAGNOSIS — G8929 Other chronic pain: Secondary | ICD-10-CM | POA: Diagnosis not present

## 2018-04-10 MED ORDER — LIDOCAINE HCL 1 % IJ SOLN
2.0000 mL | INTRAMUSCULAR | Status: AC | PRN
  Administered 2018-04-10: 2 mL

## 2018-04-10 MED ORDER — BUPIVACAINE HCL 0.5 % IJ SOLN
2.0000 mL | INTRAMUSCULAR | Status: AC | PRN
  Administered 2018-04-10: 2 mL via INTRA_ARTICULAR

## 2018-04-10 MED ORDER — METHOCARBAMOL 500 MG PO TABS: 500.0000 mg | ORAL_TABLET | Freq: Two times a day (BID) | ORAL | 0 refills | Status: DC | PRN

## 2018-04-10 MED ORDER — METHYLPREDNISOLONE ACETATE 40 MG/ML IJ SUSP
80.0000 mg | INTRAMUSCULAR | Status: AC | PRN
  Administered 2018-04-10: 80 mg
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
## 2018-04-25 ENCOUNTER — Other Ambulatory Visit (INDEPENDENT_AMBULATORY_CARE_PROVIDER_SITE_OTHER): Payer: Self-pay | Admitting: Orthopedic Surgery

## 2018-05-01 ENCOUNTER — Other Ambulatory Visit (INDEPENDENT_AMBULATORY_CARE_PROVIDER_SITE_OTHER): Payer: Self-pay | Admitting: Radiology

## 2018-05-01 ENCOUNTER — Other Ambulatory Visit: Payer: Self-pay | Admitting: Family Medicine

## 2018-05-01 MED ORDER — DICLOFENAC SODIUM 50 MG PO TBEC: DELAYED_RELEASE_TABLET | ORAL | 0 refills | Status: DC

## 2018-05-03 MED ORDER — METHYLPHENIDATE HCL ER (OSM) 36 MG PO TBCR: 36.0000 mg | EXTENDED_RELEASE_TABLET | Freq: Every day | ORAL | 0 refills | Status: DC

## 2018-05-03 NOTE — Telephone Encounter (Signed)
Last OV 03/01/18, No Future OV  Last filled 02/15/18, # 30 with 0 refills

## 2018-05-03 NOTE — Telephone Encounter (Signed)
Done

## 2018-05-12 ENCOUNTER — Other Ambulatory Visit (INDEPENDENT_AMBULATORY_CARE_PROVIDER_SITE_OTHER): Payer: Self-pay | Admitting: Radiology

## 2018-05-12 ENCOUNTER — Encounter (INDEPENDENT_AMBULATORY_CARE_PROVIDER_SITE_OTHER): Payer: Self-pay | Admitting: Orthopaedic Surgery

## 2018-05-12 ENCOUNTER — Ambulatory Visit (INDEPENDENT_AMBULATORY_CARE_PROVIDER_SITE_OTHER): Payer: BC Managed Care – PPO | Admitting: Orthopaedic Surgery

## 2018-05-12 VITALS — BP 143/90 | HR 84 | Ht 64.5 in | Wt 170.0 lb

## 2018-05-12 DIAGNOSIS — G8929 Other chronic pain: Secondary | ICD-10-CM

## 2018-05-12 DIAGNOSIS — M542 Cervicalgia: Secondary | ICD-10-CM | POA: Diagnosis not present

## 2018-05-12 DIAGNOSIS — M25562 Pain in left knee: Secondary | ICD-10-CM
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
## 2018-06-06 ENCOUNTER — Ambulatory Visit
Admission: RE | Admit: 2018-06-06 | Discharge: 2018-06-06 | Disposition: A | Discharge status: Home / Self Care | Payer: BC Managed Care – PPO | Source: Ambulatory Visit | Attending: Orthopaedic Surgery | Admitting: Orthopaedic Surgery

## 2018-06-06 DIAGNOSIS — M25562 Pain in left knee: Principal | ICD-10-CM

## 2018-06-06 DIAGNOSIS — G8929 Other chronic pain: Secondary | ICD-10-CM

## 2018-06-07 ENCOUNTER — Telehealth (INDEPENDENT_AMBULATORY_CARE_PROVIDER_SITE_OTHER): Payer: Self-pay | Admitting: Orthopaedic Surgery

## 2018-06-07 NOTE — Telephone Encounter (Signed)
Please advise 

## 2018-06-07 NOTE — Telephone Encounter (Signed)
Patient was offered those times, but has conflict with her schedule.  Patient is only available on Friday 06/09/18 at 8:00 am if he needs her to come to the office, but prefers getting the results over the phone if possible.
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
## 2018-06-07 NOTE — Telephone Encounter (Signed)
Please schedule patient at 8:45 Friday am or Thursday at 11:30 if possible. Thank you.

## 2018-06-08 NOTE — Telephone Encounter (Signed)
Called with results of MRI

## 2018-06-09 ENCOUNTER — Encounter: Payer: Self-pay | Admitting: Pulmonary Disease

## 2018-06-09 ENCOUNTER — Ambulatory Visit (INDEPENDENT_AMBULATORY_CARE_PROVIDER_SITE_OTHER): Payer: BC Managed Care – PPO | Admitting: Pulmonary Disease

## 2018-06-09 VITALS — BP 124/84 | HR 84 | Ht 64.0 in | Wt 175.0 lb

## 2018-06-09 DIAGNOSIS — G4733 Obstructive sleep apnea (adult) (pediatric): Secondary | ICD-10-CM | POA: Diagnosis not present

## 2018-06-09 DIAGNOSIS — Z9989 Dependence on other enabling machines and devices: Secondary | ICD-10-CM | POA: Diagnosis not present
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
## 2018-06-09 NOTE — Patient Instructions (Signed)
Bring your CPAP card to office for download Call or email if you find a CPAP mask you would like to switch to Follow up in 1 year

## 2018-09-29 ENCOUNTER — Other Ambulatory Visit: Payer: Self-pay | Admitting: Family Medicine

## 2018-11-14 ENCOUNTER — Other Ambulatory Visit: Payer: Self-pay | Admitting: Family Medicine

## 2018-11-14 NOTE — Telephone Encounter (Signed)
Copied from [place] [id number]. Topic: Quick Communication - Rx Refill/Question >> Nov 14, 2018 11:22 AM Celedonio Savage L wrote: Medication: methylphenidate (CONCERTA) 36 MG PO CR tablet  pt going out of town today  Has the patient contacted their pharmacy? Yes.   (Agent: If no, request that the patient contact the pharmacy for the refill.) (Agent: If yes, when and what did the pharmacy advise?) no more refills call provider  Preferred Pharmacy (with phone number or street name):   [facility] [place], [address] AT [address] [place] [phone] (Phone) [phone] (Fax)    Agent: Please be advised that RX refills may take up to 3 business days. We ask that you follow-up with your pharmacy.

## 2018-11-15 MED ORDER — METHYLPHENIDATE HCL ER (OSM) 36 MG PO TBCR: 36.0000 mg | EXTENDED_RELEASE_TABLET | Freq: Every day | ORAL | 0 refills | Status: DC

## 2018-11-15 NOTE — Telephone Encounter (Signed)
Dr. Sarajane Jews please advise of refill. thanks

## 2018-11-15 NOTE — Telephone Encounter (Signed)
Done

## 2018-12-28 ENCOUNTER — Other Ambulatory Visit: Payer: Self-pay | Admitting: Family Medicine

## 2019-01-09 ENCOUNTER — Encounter (INDEPENDENT_AMBULATORY_CARE_PROVIDER_SITE_OTHER): Payer: Self-pay | Admitting: Orthopaedic Surgery

## 2019-01-09 ENCOUNTER — Ambulatory Visit (INDEPENDENT_AMBULATORY_CARE_PROVIDER_SITE_OTHER): Payer: Self-pay

## 2019-01-09 ENCOUNTER — Ambulatory Visit (INDEPENDENT_AMBULATORY_CARE_PROVIDER_SITE_OTHER): Payer: BC Managed Care – PPO | Admitting: Orthopaedic Surgery

## 2019-01-09 VITALS — BP 154/92 | HR 87

## 2019-01-09 DIAGNOSIS — M25562 Pain in left knee: Secondary | ICD-10-CM | POA: Diagnosis not present

## 2019-01-09 DIAGNOSIS — M1712 Unilateral primary osteoarthritis, left knee: Secondary | ICD-10-CM | POA: Diagnosis not present

## 2019-01-09 NOTE — Progress Notes (Signed)
Office Visit Note   Patient: Brandi Parker           Date of Birth: [birth year]           MRN: [id number] Visit Date: 01/09/2019              Requested by: Laurey Morale, MD [place], [place] [address] PCP: Laurey Morale, MD   Assessment & Plan: Visit Diagnoses:  1. Left knee pain, unspecified chronicity   2. Unilateral primary osteoarthritis, left knee     Plan: Long discussion over 30 minutes regarding present problem with left knee.  Brandi Parker has significant osteoarthritis by plain film.  She has had prior knee arthroscopy, cortisone and Visco supplementation.  We have very in-depth discussion regarding knee replacement.  She think she would like to proceed.  We will give her clearance form for Dr. Sarajane Jews and let her think about it in more depth if she wants to proceed. I discussed the hospitalization, incision, length of hospital stay, rehab.  Potential problems and potential for long rehab.  Follow-Up Instructions: Return will schedule left knee replacement.   Orders:  Orders Placed This Encounter  Procedures  . XR KNEE 3 VIEW LEFT   No orders of the defined types were placed in this encounter.     Procedures: No procedures performed   Clinical Data: No additional findings.   Subjective: Chief Complaint  Patient presents with  . Left Knee - Pain, Follow-up  Brandi Parker visits the office today to discuss left total knee replacement.  She has had trouble for "years".  She has had prior knee arthroscopy, cortisone and Visco supplementation.  She presently "lives" on Aleve.  Without the Aleve she notes that she is very uncomfortable.  She does have some compromise of her activities but is able to at least sleep.  She is really concerned about having her knee replaced before she retires sometime towards the end of the year and wanted to discuss the knee replacement and the implications of such  HPI  Review of Systems   Objective: Vital  Signs: BP (!) 154/92   Pulse 87   Physical Exam Constitutional:      Appearance: She is well-developed.  Eyes:     Pupils: Pupils are equal, round, and reactive to light.  Pulmonary:     Effort: Pulmonary effort is normal.  Skin:    General: Skin is warm and dry.  Neurological:     Mental Status: She is alert and oriented to person, place, and time.  Psychiatric:        Behavior: Behavior normal.     Ortho Exam awake alert and oriented x3.  Comfortable sitting.  Full extension left knee with slight varus with weightbearing.  No effusion.  Knee was nice and warm but not hot.  No no instability.  Flexed over 120 degrees.  No popliteal pain.  No calf pain.  No distal edema.  Neurologically intact.  Straight leg raise negative.  Painless range of motion left hip.  Has pain predominantly on the medial compartment with some patellar crepitation.  No lateral joint pain  Specialty Comments:  No specialty comments available.  Imaging: Xr Knee 3 View Left  Result Date: 01/09/2019 Films of the left knee were obtained in several projections.  There is significant degenerative change of the medial compartment with irregularity of the joint surfaces of both the femur and the tibia.  The joint space is narrowed.  Subchondral sclerosis, subchondral cysts and peripheral osteophytes.  There are degenerative changes to a lesser extent in the lateral compartment and patellofemoral joint.    PMFS History: Patient Active Problem List   Diagnosis Date Noted  . Unilateral primary osteoarthritis, left knee 01/09/2019  . Left knee pain 01/09/2019  . TMJ arthropathy 11/08/2016  . Complex sleep apnea syndrome 04/15/2015  . ANEMIA-NOS 05/16/2009  . DEPRESSION 05/16/2009  . ADHD 05/16/2009  . ALLERGIC RHINITIS 05/16/2009  . URINARY INCONTINENCE 05/16/2009  . NEPHROLITHIASIS, HX OF 05/16/2009   Past Medical History:  Diagnosis Date  . ADHD (attention deficit hyperactivity disorder)   . Allergy   .  Anemia   . Brain injury ([place]) 1981   fell off a desk chair  . Chicken pox   . Depression   . Fracture, ankle   . Fracture, toe   . Menopausal syndrome   . Migraines   . Nephrolithiasis   . Osteopenia 01/24/09   dexa scan  . Sleep apnea syndrome    could not tolerate CPAP  . Urinary incontinence     Family History  Problem Relation Age of Onset  . Diabetes Mother        father  . Hyperlipidemia Mother   . Hypertension Mother   . Stroke Mother   . Prostate cancer Father   . Cancer Father        tumors in kidneys/tumors in liver  . Hashimoto's thyroiditis Daughter   . Hashimoto's thyroiditis Unknown        cousins x 2    Past Surgical History:  Procedure Laterality Date  . COLONOSCOPY  10/14/09   Dr. Silvano Rusk, result clear repeat in 10 years  . DILATION AND CURETTAGE OF UTERUS    . KNEE ARTHROSCOPY  12-16-11   left knee, per Dr. Durward Fortes   . KNEE ARTHROSCOPY Right 02/22/2018   per Dr. Dorna Leitz   . REPAIR NONUNION / MALUNION METATARSAL / TARSAL BONES     right 5th  . TONSILLECTOMY     Social History   Occupational History  . Occupation: Pharmacist, hospital  Tobacco Use  . Smoking status: Never Smoker  . Smokeless tobacco: Never Used  Substance and Sexual Activity  . Alcohol use: Yes    Alcohol/week: 0.0 standard drinks    Comment: occ  . Drug use: No  . Sexual activity: Not on file     Garald Balding, MD   Note - This record has been created using [employer].  Chart creation errors have been sought, but may not always  have been located. Such creation errors do not reflect on  the standard of medical care.

## 2019-02-07 ENCOUNTER — Telehealth (INDEPENDENT_AMBULATORY_CARE_PROVIDER_SITE_OTHER): Payer: Self-pay | Admitting: Orthopaedic Surgery

## 2019-02-07 NOTE — Telephone Encounter (Signed)
Left message on patient's voicemail, providing her with my name and direct number for scheduling her left total knee surgery

## 2019-02-12 NOTE — Telephone Encounter (Signed)
thanks

## 2019-03-05 ENCOUNTER — Telehealth: Payer: Self-pay | Admitting: Family Medicine

## 2019-03-05 NOTE — Telephone Encounter (Signed)
Dr. Fry please advise on refill of medication.  Thanks  

## 2019-03-05 NOTE — Telephone Encounter (Signed)
Current prescription for Methylphenidate expired.  LOV on 03/01/18

## 2019-03-05 NOTE — Telephone Encounter (Signed)
Copied from [place] [id number]. Topic: Quick Communication - Rx Refill/Question >> Mar 05, 2019  9:33 AM Nathanial Millman J wrote: Medication: methylphenidate (CONCERTA) 36 MG PO CR tablet  Has the patient contacted their pharmacy? Yes.   (Agent: If no, request that the patient contact the pharmacy for the refill.) (Agent: If yes, when and what did the pharmacy advise?) needs to be sent in   Preferred Pharmacy (with phone number or street name): [phone]  Agent: Please be advised that RX refills may take up to 3 business days. We ask that you follow-up with your pharmacy.

## 2019-03-06 MED ORDER — METHYLPHENIDATE HCL ER (OSM) 36 MG PO TBCR: 36.0000 mg | EXTENDED_RELEASE_TABLET | Freq: Every day | ORAL | 0 refills | Status: DC

## 2019-03-06 NOTE — Telephone Encounter (Signed)
I sent in one month only, she needs an OV soon

## 2019-04-13 ENCOUNTER — Other Ambulatory Visit: Payer: Self-pay

## 2019-04-20 ENCOUNTER — Other Ambulatory Visit: Payer: Self-pay

## 2019-04-20 ENCOUNTER — Ambulatory Visit (INDEPENDENT_AMBULATORY_CARE_PROVIDER_SITE_OTHER): Payer: BC Managed Care – PPO | Admitting: Family Medicine

## 2019-04-20 ENCOUNTER — Encounter: Payer: Self-pay | Admitting: Family Medicine

## 2019-04-20 DIAGNOSIS — F900 Attention-deficit hyperactivity disorder, predominantly inattentive type: Secondary | ICD-10-CM

## 2019-04-20 DIAGNOSIS — F320 Major depressive disorder, single episode, mild: Secondary | ICD-10-CM | POA: Diagnosis not present

## 2019-04-20 MED ORDER — METHYLPHENIDATE HCL ER (OSM) 36 MG PO TBCR: 36.0000 mg | EXTENDED_RELEASE_TABLET | Freq: Every day | ORAL | 0 refills | Status: DC

## 2019-04-20 MED ORDER — OMEPRAZOLE 20 MG PO CPDR: 20.0000 mg | DELAYED_RELEASE_CAPSULE | Freq: Every day | ORAL | 3 refills | Status: AC

## 2019-04-20 MED ORDER — BUPROPION HCL ER (XL) 300 MG PO TB24: 300.0000 mg | ORAL_TABLET | Freq: Every day | ORAL | 3 refills | Status: DC

## 2019-04-20 NOTE — Progress Notes (Signed)
Subjective:    Patient ID: Brandi Parker, female    DOB: 1956/07/10, 63 y.o.   MRN: [id number]  HPI Virtual Visit via Video Note  I connected with the patient on 04/20/19 at  8:15 AM EDT by a video enabled telemedicine application and verified that I am speaking with the correct person using two identifiers.  Location patient: home Location provider:work or home office Persons participating in the virtual visit: patient, provider  I discussed the limitations of evaluation and management by telemedicine and the availability of in person appointments. The patient expressed understanding and agreed to proceed.   HPI: Here for refills she is doing well. Her depression is stable and the Concerta is still working well. She will be retiring from [employer] in a few weeks. Sleep and appetite are intact.    ROS: See pertinent positives and negatives per HPI.  Past Medical History:  Diagnosis Date  . ADHD (attention deficit hyperactivity disorder)   . Allergy   . Anemia   . Brain injury ([place]) 1981   fell off a desk chair  . Chicken pox   . Depression   . Fracture, ankle   . Fracture, toe   . Menopausal syndrome   . Migraines   . Nephrolithiasis   . Osteopenia 01/24/09   dexa scan  . Sleep apnea syndrome    could not tolerate CPAP  . Urinary incontinence     Past Surgical History:  Procedure Laterality Date  . COLONOSCOPY  10/14/09   Dr. Silvano Rusk, result clear repeat in 10 years  . DILATION AND CURETTAGE OF UTERUS    . KNEE ARTHROSCOPY  12-16-11   left knee, per Dr. Durward Fortes   . KNEE ARTHROSCOPY Right 02/22/2018   per Dr. Dorna Leitz   . REPAIR NONUNION / MALUNION METATARSAL / TARSAL BONES     right 5th  . TONSILLECTOMY      Family History  Problem Relation Age of Onset  . Diabetes Mother        father  . Hyperlipidemia Mother   . Hypertension Mother   . Stroke Mother   . Prostate cancer Father   . Cancer Father        tumors in kidneys/tumors  in liver  . Hashimoto's thyroiditis Daughter   . Hashimoto's thyroiditis Unknown        cousins x 2     Current Outpatient Medications:  .  buPROPion (WELLBUTRIN XL) 300 MG 24 hr tablet, Take 1 tablet (300 mg total) by mouth daily., Disp: 90 tablet, Rfl: 3 .  Calcium Carbonate 1500 MG TABS, Take by mouth daily.  , Disp: , Rfl:  .  Cholecalciferol (VITAMIN D) 2000 UNITS tablet, Take 2,000 Units by mouth daily.  , Disp: , Rfl:  .  eletriptan (RELPAX) 40 MG tablet, Take 1 tablet (40 mg total) by mouth as needed for migraine. may repeat in 2 hours if necessary, Disp: 6 tablet, Rfl: 11 .  estradiol (VIVELLE-DOT) 0.025 MG/24HR, Place 1 patch onto the skin once a week. , Disp: , Rfl:  .  [START ON 06/20/2019] methylphenidate (CONCERTA) 36 MG PO CR tablet, Take 1 tablet (36 mg total) by mouth daily for 30 days., Disp: 30 tablet, Rfl: 0 .  Naproxen Sodium (ALEVE) 220 MG CAPS, Take 2 capsules by mouth daily., Disp: , Rfl:  .  omeprazole (PRILOSEC) 20 MG capsule, Take 1 capsule (20 mg total) by mouth daily., Disp: 30 capsule, Rfl: 3 .  progesterone (PROMETRIUM) 100 MG capsule, Take 100 mg by mouth daily.  , Disp: , Rfl:  .  valACYclovir (VALTREX) 1000 MG tablet, Take 1 tablet (1,000 mg total) by mouth as needed., Disp: 30 tablet, Rfl: 11  EXAM:  VITALS per patient if applicable:  GENERAL: alert, oriented, appears well and in no acute distress  HEENT: atraumatic, conjunttiva clear, no obvious abnormalities on inspection of external nose and ears  NECK: normal movements of the head and neck  LUNGS: on inspection no signs of respiratory distress, breathing rate appears normal, no obvious gross SOB, gasping or wheezing  CV: no obvious cyanosis  MS: moves all visible extremities without noticeable abnormality  PSYCH/NEURO: pleasant and cooperative, no obvious depression or anxiety, speech and thought processing grossly intact  ASSESSMENT AND PLAN: Her depression and ADHD are stable. Meds are  refilled.  Alysia Penna, MD Discussed the following assessment and plan:  No diagnosis found.     I discussed the assessment and treatment plan with the patient. The patient was provided an opportunity to ask questions and all were answered. The patient agreed with the plan and demonstrated an understanding of the instructions.   The patient was advised to call back or seek an in-person evaluation if the symptoms worsen or if the condition fails to improve as anticipated.     Review of Systems     Objective:   Physical Exam        Assessment & Plan:

## 2019-07-04 ENCOUNTER — Telehealth: Payer: Self-pay | Admitting: *Deleted

## 2019-07-04 NOTE — Telephone Encounter (Signed)
Copied from [place] [id number]. Topic: General - Other >> Jul 03, 2019 10:19 AM Percell Belt A wrote: Reason for CRM: Pt called in and stated that the methylphenidate (CONCERTA) 36 MG PO CR tablet [[id number]]  is needing a PA done on it . Pt stated she only has 3 more days left of this meds.  She stated the ins compy would take an approval over the phone PA number is [phone]   Clinic RN called [facility] for PA. PA was approved for 36 months. Approval code is [id number]

## 2019-08-09 ENCOUNTER — Other Ambulatory Visit: Payer: Self-pay | Admitting: Family Medicine

## 2019-08-09 NOTE — Telephone Encounter (Signed)
This refill cannot be delegated  Requested Prescriptions  Pending Prescriptions Disp Refills  . methylphenidate (CONCERTA) 36 MG PO CR tablet 30 tablet 0    Sig: Take 1 tablet (36 mg total) by mouth daily.     Not Delegated - Psychiatry:  Stimulants/ADHD Failed - 08/09/2019 12:33 PM      Failed - This refill cannot be delegated      Failed - Urine Drug Screen completed in last 360 days.      Failed - Valid encounter within last 3 months    Recent Outpatient Visits          3 months ago Attention deficit hyperactivity disorder (ADHD), predominantly inattentive type   [facility] at [facility], Ishmael Holter, MD   1 year ago Preventative health care   [place] [facility] [place], Ishmael Holter, MD   2 years ago TMJ arthropathy   Therapist, [facility] at [facility], Ishmael Holter, MD   4 years ago Preventative health care   [facility] [place], Ishmael Holter, MD   5 years ago Preventative health care   [place] [facility] [place], Ishmael Holter, MD

## 2019-08-09 NOTE — Telephone Encounter (Signed)
Copied from [place] [id number] [id number]. Topic: Quick Communication - Rx Refill/Question >> Aug 09, 2019 11:03 AM Leward Quan A wrote: Medication: methylphenidate (CONCERTA) 36 MG PO CR tablet  90 day supply per patient   Has the patient contacted their pharmacy?  yes (Agent: If no, request that the patient contact the pharmacy for the refill.) (Agent: If yes, when and what did the pharmacy advise?)  Preferred Pharmacy (with phone number or street name): [facility] [place], [address] AT [place] [phone] (Phone) [phone] (Fax)    Agent: Please be advised that RX refills may take up to 3 business days. We ask that you follow-up with your pharmacy.

## 2019-08-10 MED ORDER — METHYLPHENIDATE HCL ER (OSM) 36 MG PO TBCR: 36.0000 mg | EXTENDED_RELEASE_TABLET | Freq: Every day | ORAL | 0 refills | Status: DC

## 2019-08-10 NOTE — Telephone Encounter (Signed)
Last filled 06/20/2019 Last OV 04/20/2019  Ok to fill?

## 2019-08-10 NOTE — Telephone Encounter (Signed)
Done

## 2019-09-11 ENCOUNTER — Encounter: Payer: Self-pay | Admitting: Internal Medicine

## 2019-12-07 ENCOUNTER — Other Ambulatory Visit: Payer: Self-pay | Admitting: Family Medicine

## 2019-12-07 NOTE — Telephone Encounter (Signed)
Medication Refill - Medication: methylphenidate (CONCERTA) 36 MG PO CR tablet    Has the patient contacted their pharmacy? Yes.   (Agent: If no, request that the patient contact the pharmacy for the refill.) (Agent: If yes, when and what did the pharmacy advise?)  Preferred Pharmacy (with phone number or street name):  [facility], [address] AT [address] [place]  [place] [address]  Phone: [phone] Fax: [phone]     Agent: Please be advised that RX refills may take up to 3 business days. We ask that you follow-up with your pharmacy.

## 2019-12-07 NOTE — Telephone Encounter (Signed)
Last filled 10/10/2019 Last OV 04/20/2019  Ok to fill?

## 2019-12-07 NOTE — Telephone Encounter (Signed)
Please advise 

## 2019-12-07 NOTE — Telephone Encounter (Signed)
Requested medication (s) are due for refill today: yes  Requested medication (s) are on the active medication list: yes  Last refill:  10/10/2019  Future visit scheduled: no  Notes to clinic:  refill cannot be delegated    Requested Prescriptions  Pending Prescriptions Disp Refills   methylphenidate (CONCERTA) 36 MG PO CR tablet 30 tablet 0    Sig: Take 1 tablet (36 mg total) by mouth daily.      Not Delegated - Psychiatry:  Stimulants/ADHD Failed - 12/07/2019 12:45 PM      Failed - This refill cannot be delegated      Failed - Urine Drug Screen completed in last 360 days.      Failed - Valid encounter within last 3 months    Recent Outpatient Visits           7 months ago Attention deficit hyperactivity disorder (ADHD), predominantly inattentive type   Therapist, music at [facility], Ishmael Holter, MD   1 year ago Preventative health care   [place] [facility] [place], Ishmael Holter, MD   3 years ago TMJ arthropathy   Therapist, music at [facility] [employer], Ishmael Holter, MD   4 years ago Preventative health care   [facility] at [place], Ishmael Holter, MD   5 years ago Preventative health care   [place] [facility] [place], Ishmael Holter, MD

## 2019-12-10 MED ORDER — METHYLPHENIDATE HCL ER (OSM) 36 MG PO TBCR: 36.0000 mg | EXTENDED_RELEASE_TABLET | Freq: Every day | ORAL | 0 refills | Status: DC

## 2019-12-10 NOTE — Telephone Encounter (Signed)
Done

## 2020-01-25 ENCOUNTER — Telehealth: Payer: Self-pay | Admitting: Family Medicine

## 2020-01-25 NOTE — Telephone Encounter (Signed)
methylphenidate (CONCERTA) 36 MG PO CR tablet  Send to:  [facility] [place], [address] AT [address] [place] Phone:  [phone]  Fax:  [phone]     3 months worth of refills

## 2020-01-25 NOTE — Telephone Encounter (Signed)
Last filled 02/10/2020 Last OV 04/20/2019  Ok to fill?

## 2020-01-28 MED ORDER — METHYLPHENIDATE HCL ER (OSM) 36 MG PO TBCR: 36.0000 mg | EXTENDED_RELEASE_TABLET | Freq: Every day | ORAL | 0 refills | Status: DC

## 2020-01-28 NOTE — Telephone Encounter (Signed)
Done

## 2020-01-28 NOTE — Telephone Encounter (Signed)
Noted. Patient is aware. 

## 2020-01-28 NOTE — Addendum Note (Signed)
Addended by: Alysia Penna A on: 01/28/2020 12:48 PM   Modules accepted: Orders

## 2020-02-02 ENCOUNTER — Ambulatory Visit: Payer: BC Managed Care – PPO

## 2020-02-16 ENCOUNTER — Ambulatory Visit: Payer: BC Managed Care – PPO | Attending: Internal Medicine

## 2020-02-16 DIAGNOSIS — Z23 Encounter for immunization: Secondary | ICD-10-CM | POA: Insufficient documentation

## 2020-02-16 NOTE — Progress Notes (Signed)
   Covid-19 Vaccination Clinic  Name:  Brandi Parker    MRN: [id number] DOB: 17-Nov-1956  02/16/2020  [name] was observed post Covid-19 immunization for 15 minutes without incidence. She was provided with Vaccine Information Sheet and instruction to access the V-Safe system.   [name] was instructed to call 911 with any severe reactions post vaccine: Marland Kitchen Difficulty breathing  . Swelling of your face and throat  . A fast heartbeat  . A bad rash all over your body  . Dizziness and weakness    Immunizations Administered    Name Date Dose VIS Date Route   Pfizer COVID-19 Vaccine 02/16/2020 11:15 AM 0.3 mL 11/30/2019 Intramuscular   Manufacturer: [place]   Lot: [id number]   [place]: [id number]

## 2020-03-08 ENCOUNTER — Ambulatory Visit: Payer: BC Managed Care – PPO | Attending: Internal Medicine

## 2020-03-08 DIAGNOSIS — Z23 Encounter for immunization: Secondary | ICD-10-CM

## 2020-03-08 NOTE — Progress Notes (Signed)
   Covid-19 Vaccination Clinic  Name:  Brandi Parker    MRN: [id number] DOB: 1956-01-02  03/08/2020  [name] was observed post Covid-19 immunization for 15 minutes without incident. She was provided with Vaccine Information Sheet and instruction to access the V-Safe system.   [name] was instructed to call 911 with any severe reactions post vaccine: Marland Kitchen Difficulty breathing  . Swelling of face and throat  . A fast heartbeat  . A bad rash all over body  . Dizziness and weakness   Immunizations Administered    Name Date Dose VIS Date Route   Pfizer COVID-19 Vaccine 03/08/2020 10:00 AM 0.3 mL 11/30/2019 Intramuscular   Manufacturer: [place]   Lot: [id number]   [place]: [id number]

## 2020-03-12 ENCOUNTER — Ambulatory Visit: Payer: BC Managed Care – PPO

## 2020-04-19 HISTORY — PX: CATARACT EXTRACTION, BILATERAL: SHX1313

## 2020-05-05 ENCOUNTER — Telehealth: Payer: Self-pay | Admitting: Family Medicine

## 2020-05-05 NOTE — Telephone Encounter (Signed)
Patient has not been seen in over a year and will need an appointment for any further refills.  Left message for patient to call back.

## 2020-05-05 NOTE — Telephone Encounter (Signed)
Pt would like to know if she needs to schedule an appt?  Per pt, her refill for Wellbutrin XL was denied and doesn't know why. Thanks

## 2020-05-09 NOTE — Telephone Encounter (Signed)
Patient called wanting to know why she hasn't heard anything about her refill request. I advised the patient that she needs to make an appointment. The patient will call back Monday to schedule when she has her calendar.

## 2020-05-29 ENCOUNTER — Other Ambulatory Visit: Payer: Self-pay

## 2020-05-30 ENCOUNTER — Encounter: Payer: Self-pay | Admitting: Family Medicine

## 2020-05-30 ENCOUNTER — Ambulatory Visit: Payer: BC Managed Care – PPO | Admitting: Family Medicine

## 2020-05-30 VITALS — BP 120/80 | HR 89 | Temp 97.3°F | Wt 189.4 lb

## 2020-05-30 DIAGNOSIS — L03032 Cellulitis of left toe: Secondary | ICD-10-CM | POA: Diagnosis not present

## 2020-05-30 DIAGNOSIS — F900 Attention-deficit hyperactivity disorder, predominantly inattentive type: Secondary | ICD-10-CM | POA: Diagnosis not present

## 2020-05-30 DIAGNOSIS — F320 Major depressive disorder, single episode, mild: Secondary | ICD-10-CM

## 2020-05-30 MED ORDER — CEPHALEXIN 500 MG PO CAPS: 500.0000 mg | ORAL_CAPSULE | Freq: Three times a day (TID) | ORAL | 0 refills | Status: AC

## 2020-05-30 MED ORDER — METHYLPHENIDATE HCL ER (OSM) 36 MG PO TBCR: 36.0000 mg | EXTENDED_RELEASE_TABLET | Freq: Every day | ORAL | 0 refills | Status: DC

## 2020-05-30 MED ORDER — BUPROPION HCL ER (XL) 300 MG PO TB24: 300.0000 mg | ORAL_TABLET | Freq: Every day | ORAL | 3 refills | Status: DC

## 2020-05-30 NOTE — Progress Notes (Signed)
   Subjective:    Patient ID: Brandi Parker, female    DOB: 1956/10/19, 64 y.o.   MRN: [id number]  HPI Here for medication refills and to check her left great toe. About one week ago she stubbed her left great toe against a bed post. Since then it has been swollen and painful. She soaks it BID in warm water with Epsom salts. Also she needs refills on medication. Her depression and her ADHD has been well controlled.    Review of Systems  Constitutional: Negative.   Respiratory: Negative.   Cardiovascular: Negative.   Skin: Positive for wound.  Neurological: Negative.   Psychiatric/Behavioral: Negative.        Objective:   Physical Exam Constitutional:      Appearance: Normal appearance.  Cardiovascular:     Rate and Rhythm: Normal rate and regular rhythm.     Pulses: Normal pulses.     Heart sounds: Normal heart sounds.  Pulmonary:     Effort: Pulmonary effort is normal.     Breath sounds: Normal breath sounds.  Skin:    Comments: The left great toenail is partially ingrown and the medial edge along the nail is red and swollen and tender   Neurological:     Mental Status: She is alert.           Assessment & Plan:  Her depression and ADHD are stable. Wellbutrin and Concerta were refilled. Treat the paronychia with Keflex.  Alysia Penna, MD

## 2020-07-08 ENCOUNTER — Encounter: Payer: Self-pay | Admitting: Family Medicine

## 2020-07-08 ENCOUNTER — Encounter: Payer: Self-pay | Admitting: Internal Medicine

## 2020-07-08 ENCOUNTER — Ambulatory Visit (INDEPENDENT_AMBULATORY_CARE_PROVIDER_SITE_OTHER): Payer: BC Managed Care – PPO | Admitting: Family Medicine

## 2020-07-08 ENCOUNTER — Other Ambulatory Visit: Payer: Self-pay

## 2020-07-08 VITALS — BP 124/64 | HR 87 | Temp 98.8°F | Wt 188.0 lb

## 2020-07-08 DIAGNOSIS — Z0000 Encounter for general adult medical examination without abnormal findings: Secondary | ICD-10-CM

## 2020-07-08 MED ORDER — ESCITALOPRAM OXALATE 10 MG PO TABS: 10.0000 mg | ORAL_TABLET | Freq: Every day | ORAL | 11 refills | Status: DC

## 2020-07-08 MED ORDER — VALACYCLOVIR HCL 1 G PO TABS: 1000.0000 mg | ORAL_TABLET | ORAL | 11 refills | Status: DC | PRN

## 2020-07-08 MED ORDER — ACYCLOVIR 5 % EX OINT: 1.0000 "application " | TOPICAL_OINTMENT | CUTANEOUS | 5 refills | Status: DC

## 2020-07-08 NOTE — Progress Notes (Signed)
   Subjective:    Patient ID: Brandi Parker, female    DOB: [birth year], 65 y.o.   MRN: [id number]  HPI Here for a well exam. She feels fine except for her depression, which has been more of a problem lately. She has been under a lot of stress with family issues. She has been on Wellbutrin for over 10 years now.    Review of Systems  Constitutional: Negative.   HENT: Negative.   Eyes: Negative.   Respiratory: Negative.   Cardiovascular: Negative.   Gastrointestinal: Negative.   Genitourinary: Negative for decreased urine volume, difficulty urinating, dyspareunia, dysuria, enuresis, flank pain, frequency, hematuria, pelvic pain and urgency.  Musculoskeletal: Negative.   Skin: Negative.   Neurological: Negative.   Psychiatric/Behavioral: Positive for dysphoric mood.       Objective:   Physical Exam Constitutional:      General: She is not in acute distress.    Appearance: She is well-developed.  HENT:     Head: Normocephalic and atraumatic.     Right Ear: External ear normal.     Left Ear: External ear normal.     Nose: Nose normal.     Mouth/Throat:     Pharynx: No oropharyngeal exudate.  Eyes:     General: No scleral icterus.    Conjunctiva/sclera: Conjunctivae normal.     Pupils: Pupils are equal, round, and reactive to light.  Neck:     Thyroid: No thyromegaly.     Vascular: No JVD.  Cardiovascular:     Rate and Rhythm: Normal rate and regular rhythm.     Heart sounds: Normal heart sounds. No murmur heard.  No friction rub. No gallop.   Pulmonary:     Effort: Pulmonary effort is normal. No respiratory distress.     Breath sounds: Normal breath sounds. No wheezing or rales.  Chest:     Chest wall: No tenderness.  Abdominal:     General: Bowel sounds are normal. There is no distension.     Palpations: Abdomen is soft. There is no mass.     Tenderness: There is no abdominal tenderness. There is no guarding or rebound.  Musculoskeletal:        General: No  tenderness. Normal range of motion.     Cervical back: Normal range of motion and neck supple.  Lymphadenopathy:     Cervical: No cervical adenopathy.  Skin:    General: Skin is warm and dry.     Findings: No erythema or rash.  Neurological:     Mental Status: She is alert and oriented to person, place, and time.     Cranial Nerves: No cranial nerve deficit.     Motor: No abnormal muscle tone.     Coordination: Coordination normal.     Deep Tendon Reflexes: Reflexes are normal and symmetric. Reflexes normal.  Psychiatric:        Behavior: Behavior normal.        Thought Content: Thought content normal.        Judgment: Judgment normal.           Assessment & Plan:  Well exam. We discussed diet and exercise. Get fasting labs. Set up another colonoscopy. For her depression we will add Lexapro 10 mg daily to her Wellbutrin. Recheck in 3-4 weeks.  Alysia Penna, MD

## 2020-07-09 LAB — CBC WITH DIFFERENTIAL/PLATELET
Absolute Monocytes: 637 cells/uL (ref 200–950)
Basophils Absolute: 47 cells/uL (ref 0–200)
Basophils Relative: 0.7 %
Eosinophils Absolute: 248 cells/uL (ref 15–500)
Eosinophils Relative: 3.7 %
HCT: 43.5 % (ref 35.0–45.0)
Hemoglobin: 13.6 g/dL (ref 11.7–15.5)
Lymphs Abs: 1936 cells/uL (ref 850–3900)
MCH: 26.7 pg — ABNORMAL LOW (ref 27.0–33.0)
MCHC: 31.3 g/dL — ABNORMAL LOW (ref 32.0–36.0)
MCV: 85.5 fL (ref 80.0–100.0)
MPV: 10.5 fL (ref 7.5–12.5)
Monocytes Relative: 9.5 %
Neutro Abs: 3832 cells/uL (ref 1500–7800)
Neutrophils Relative %: 57.2 %
Platelets: 322 10*3/uL (ref 140–400)
RBC: 5.09 10*6/uL (ref 3.80–5.10)
RDW: 13.2 % (ref 11.0–15.0)
Total Lymphocyte: 28.9 %
WBC: 6.7 10*3/uL (ref 3.8–10.8)

## 2020-07-09 LAB — HEPATIC FUNCTION PANEL
AG Ratio: 1.6 (calc) (ref 1.0–2.5)
ALT: 19 U/L (ref 6–29)
AST: 21 U/L (ref 10–35)
Albumin: 4.2 g/dL (ref 3.6–5.1)
Alkaline phosphatase (APISO): 88 U/L (ref 37–153)
Bilirubin, Direct: 0.1 mg/dL (ref 0.0–0.2)
Globulin: 2.7 g/dL (calc) (ref 1.9–3.7)
Indirect Bilirubin: 0.5 mg/dL (calc) (ref 0.2–1.2)
Total Bilirubin: 0.6 mg/dL (ref 0.2–1.2)
Total Protein: 6.9 g/dL (ref 6.1–8.1)

## 2020-07-09 LAB — LIPID PANEL
Cholesterol: 179 mg/dL (ref <200)
HDL: 61 mg/dL (ref >=50)
LDL Cholesterol (Calc): 102 mg/dL (calc) — ABNORMAL HIGH
Non-HDL Cholesterol (Calc): 118 mg/dL (calc) (ref <130)
Total CHOL/HDL Ratio: 2.9 (calc) (ref <5.0)
Triglycerides: 70 mg/dL (ref <150)

## 2020-07-09 LAB — BASIC METABOLIC PANEL
BUN: 24 mg/dL (ref 7–25)
CO2: 29 mmol/L (ref 20–32)
Calcium: 9.7 mg/dL (ref 8.6–10.4)
Chloride: 106 mmol/L (ref 98–110)
Creat: 0.95 mg/dL (ref 0.50–0.99)
Glucose, Bld: 101 mg/dL — ABNORMAL HIGH (ref 65–99)
Potassium: 5 mmol/L (ref 3.5–5.3)
Sodium: 141 mmol/L (ref 135–146)

## 2020-07-09 LAB — TSH: TSH: 2.74 mIU/L (ref 0.40–4.50)

## 2020-08-29 ENCOUNTER — Ambulatory Visit (AMBULATORY_SURGERY_CENTER): Payer: Self-pay

## 2020-08-29 ENCOUNTER — Other Ambulatory Visit: Payer: Self-pay

## 2020-08-29 ENCOUNTER — Encounter: Payer: Self-pay | Admitting: Internal Medicine

## 2020-08-29 VITALS — Ht 64.0 in | Wt 185.0 lb

## 2020-08-29 DIAGNOSIS — Z1211 Encounter for screening for malignant neoplasm of colon: Secondary | ICD-10-CM

## 2020-08-29 NOTE — Progress Notes (Signed)
No egg or soy allergy known to patient  No issues with past sedation with any surgeries or procedures no intubation problems in the past  No FH of Malignant Hyperthermia No diet pills per patient No home 02 use per patient  No blood thinners per patient  Pt denies issues with constipation  No A fib or A flutter  EMMI video to pt or via [place] 19 guidelines implemented in [place] today with Pt and RN   Pt has been vaccinated for covid,   pt aware another doctor will be doing her procedure on 9/30.  Due to the COVID-19 pandemic we are asking patients to follow these guidelines. Please only bring one care partner. Please be aware that your care partner may wait in the car in the parking lot or if they feel like they will be too hot to wait in the car, they may wait in the lobby on the 4th floor. All care partners are required to wear a mask the entire time (we do not have any that we can provide them), they need to practice social distancing, and we will do a Covid check for all patient's and care partners when you arrive. Also we will check their temperature and your temperature. If the care partner waits in their car they need to stay in the parking lot the entire time and we will call them on their cell phone when the patient is ready for discharge so they can bring the car to the front of the building. Also all patient's will need to wear a mask into building.

## 2020-09-03 ENCOUNTER — Other Ambulatory Visit: Payer: Self-pay | Admitting: Family Medicine

## 2020-09-03 MED ORDER — METHYLPHENIDATE HCL ER (OSM) 36 MG PO TBCR: 36.0000 mg | EXTENDED_RELEASE_TABLET | Freq: Every day | ORAL | 0 refills | Status: DC

## 2020-09-03 NOTE — Telephone Encounter (Signed)
Pt is calling in stating that she needs a refill on methlyphenidate (CONCERTA) 36 MG  Pharm:  [facility] [address] [facility]

## 2020-09-03 NOTE — Telephone Encounter (Signed)
Done

## 2020-09-05 ENCOUNTER — Encounter: Payer: BC Managed Care – PPO | Admitting: Internal Medicine

## 2020-09-12 ENCOUNTER — Encounter: Payer: BC Managed Care – PPO | Admitting: Gastroenterology

## 2020-09-18 ENCOUNTER — Encounter: Payer: Self-pay | Admitting: Internal Medicine

## 2020-09-18 ENCOUNTER — Ambulatory Visit (AMBULATORY_SURGERY_CENTER): Payer: BC Managed Care – PPO | Admitting: Internal Medicine

## 2020-09-18 ENCOUNTER — Encounter: Payer: BC Managed Care – PPO | Admitting: Internal Medicine

## 2020-09-18 ENCOUNTER — Other Ambulatory Visit: Payer: Self-pay

## 2020-09-18 VITALS — BP 119/68 | HR 69 | Temp 97.3°F | Resp 21 | Ht 64.0 in | Wt 185.0 lb

## 2020-09-18 DIAGNOSIS — Z1211 Encounter for screening for malignant neoplasm of colon: Secondary | ICD-10-CM

## 2020-09-18 DIAGNOSIS — D120 Benign neoplasm of cecum: Secondary | ICD-10-CM

## 2020-09-18 HISTORY — PX: COLONOSCOPY: SHX174

## 2020-09-18 MED ORDER — SODIUM CHLORIDE 0.9 % IV SOLN: 500.0000 mL | Freq: Once | INTRAVENOUS | Status: DC

## 2020-09-18 NOTE — Progress Notes (Signed)
PT taken to PACU. Monitors in place. VSS. Report given to RN. 

## 2020-09-18 NOTE — Patient Instructions (Signed)
Handouts provided on polyps and diverticulosis.  ? ?YOU HAD AN ENDOSCOPIC PROCEDURE TODAY AT THE [facility]:   Refer to the procedure report that was given to you for any specific questions about what was found during the examination.  If the procedure report does not answer your questions, please call your gastroenterologist to clarify.  If you requested that your care partner not be given the details of your procedure findings, then the procedure report has been included in a sealed envelope for you to review at your convenience later. ? ?YOU SHOULD EXPECT: Some feelings of bloating in the abdomen. Passage of more gas than usual.  Walking can help get rid of the air that was put into your GI tract during the procedure and reduce the bloating. If you had a lower endoscopy (such as a colonoscopy or flexible sigmoidoscopy) you may notice spotting of blood in your stool or on the toilet paper. If you underwent a bowel prep for your procedure, you may not have a normal bowel movement for a few days. ? ?Please Note:  You might notice some irritation and congestion in your nose or some drainage.  This is from the oxygen used during your procedure.  There is no need for concern and it should clear up in a day or so. ? ?SYMPTOMS TO REPORT IMMEDIATELY: ? ?Following lower endoscopy (colonoscopy or flexible sigmoidoscopy): ? Excessive amounts of blood in the stool ? Significant tenderness or worsening of abdominal pains ? Swelling of the abdomen that is new, acute ? Fever of 100?F or higher ? ?For urgent or emergent issues, a gastroenterologist can be reached at any hour by calling [phone]. ?Do not use MyChart messaging for urgent concerns.  ? ? ?DIET:  We do recommend a small meal at first, but then you may proceed to your regular diet.  Drink plenty of fluids but you should avoid alcoholic beverages for 24 hours. ? ?ACTIVITY:  You should plan to take it easy for the rest of today and you should NOT  DRIVE or use heavy machinery until tomorrow (because of the sedation medicines used during the test).   ? ?FOLLOW UP: ?Our staff will call the number listed on your records 48-72 hours following your procedure to check on you and address any questions or concerns that you may have regarding the information given to you following your procedure. If we do not reach you, we will leave a message.  We will attempt to reach you two times.  During this call, we will ask if you have developed any symptoms of COVID 19. If you develop any symptoms (ie: fever, flu-like symptoms, shortness of breath, cough etc.) before then, please call [phone].  If you test positive for Covid 19 in the 2 weeks post procedure, please call and report this information to us.   ? ?If any biopsies were taken you will be contacted by phone or by letter within the next 1-3 weeks.  Please call us at [phone] if you have not heard about the biopsies in 3 weeks.  ? ? ?SIGNATURES/CONFIDENTIALITY: ?You and/or your care partner have signed paperwork which will be entered into your electronic medical record.  These signatures attest to the fact that that the information above on your After Visit Summary has been reviewed and is understood.  Full responsibility of the confidentiality of this discharge information lies with you and/or your care-partner. ? ?

## 2020-09-18 NOTE — Op Note (Signed)
[facility] Patient Name: Brandi Parker Procedure Date: 09/18/2020 10:03 AM MRN: [id number] Endoscopist: Docia Chuck. Henrene Pastor , MD Age: 64 Referring MD:  Date of Birth: Dec 20, 1956 Gender: Female Account #: [id number] Procedure:                Colonoscopy with cold snare polypectomy x 1 Indications:              Screening for colorectal malignant neoplasm.                            Negative index exam 2010 (Dr. Carlean Purl) Medicines:                Monitored Anesthesia Care Procedure:                Pre-Anesthesia Assessment:                           - Prior to the procedure, a History and Physical                            was performed, and patient medications and                            allergies were reviewed. The patient's tolerance of                            previous anesthesia was also reviewed. The risks                            and benefits of the procedure and the sedation                            options and risks were discussed with the patient.                            All questions were answered, and informed consent                            was obtained. Prior Anticoagulants: The patient has                            taken no previous anticoagulant or antiplatelet                            agents. ASA Grade Assessment: II - A patient with                            mild systemic disease. After reviewing the risks                            and benefits, the patient was deemed in                            satisfactory condition to undergo the procedure.  After obtaining informed consent, the colonoscope                            was passed under direct vision. Throughout the                            procedure, the patient's blood pressure, pulse, and                            oxygen saturations were monitored continuously. The                            Colonoscope was introduced through the anus and                             advanced to the the cecum, identified by                            appendiceal orifice and ileocecal valve. The                            ileocecal valve, appendiceal orifice, and rectum                            were photographed. The quality of the bowel                            preparation was excellent. The colonoscopy was                            performed without difficulty. The patient tolerated                            the procedure well. The bowel preparation used was                            Miralax via split dose instruction. Scope In: 10:09:27 AM Scope Out: 10:22:49 AM Scope Withdrawal Time: 0 hours 9 minutes 56 seconds  Total Procedure Duration: 0 hours 13 minutes 22 seconds  Findings:                 A 1 mm polyp was found in the cecum. The polyp was                            removed with a cold snare. Resection and retrieval                            were complete.                           Multiple diverticula were found in the left colon                            and right colon.  The exam was otherwise without abnormality on                            direct and retroflexion views. Complications:            No immediate complications. Estimated blood loss:                            None. Estimated Blood Loss:     Estimated blood loss: none. Impression:               - One 1 mm polyp in the cecum, removed with a cold                            snare. Resected and retrieved.                           - Diverticulosis in the left colon and in the right                            colon.                           - The examination was otherwise normal on direct                            and retroflexion views. Recommendation:           - Repeat colonoscopy in 7-10 years for surveillance.                           - Patient has a contact number available for                            emergencies. The signs and symptoms of  potential                            delayed complications were discussed with the                            patient. Return to normal activities tomorrow.                            Written discharge instructions were provided to the                            patient.                           - Resume previous diet.                           - Continue present medications.                           - Await pathology results. Docia Chuck. Henrene Pastor, MD 09/18/2020 10:29:28 AM This report has been signed electronically.

## 2020-09-18 NOTE — Progress Notes (Signed)
Called to room to assist during endoscopic procedure.  Patient ID and intended procedure confirmed with present staff. Received instructions for my participation in the procedure from the performing physician.  

## 2020-09-18 NOTE — Progress Notes (Signed)
Pt's states no medical or surgical changes since previsit or office visit.  C. W. Vital signs.

## 2020-09-22 ENCOUNTER — Telehealth: Payer: Self-pay

## 2020-09-22 NOTE — Telephone Encounter (Signed)
  Follow up Call-  Call back number 09/18/2020  Post procedure Call Back phone  # [phone]  Permission to leave phone message Yes  Some recent data might be hidden     Patient questions:  Do you have a fever, pain , or abdominal swelling? No. Pain Score  0 *  Have you tolerated food without any problems? Yes.    Have you been able to return to your normal activities? Yes.    Do you have any questions about your discharge instructions: Diet   No. Medications  No. Follow up visit  No.  Do you have questions or concerns about your Care? No.  Actions: * If pain score is 4 or above: No action needed, pain <4.  1. Have you developed a fever since your procedure? no  2.   Have you had an respiratory symptoms (SOB or cough) since your procedure? no  3.   Have you tested positive for COVID 19 since your procedure no  4.   Have you had any family members/close contacts diagnosed with the COVID 19 since your procedure?  no   If yes to any of these questions please route to Joylene John, RN and Joella Prince, RN

## 2020-09-23 ENCOUNTER — Encounter: Payer: Self-pay | Admitting: Internal Medicine

## 2020-10-17 ENCOUNTER — Encounter: Payer: Self-pay | Admitting: Family Medicine

## 2020-10-17 ENCOUNTER — Other Ambulatory Visit: Payer: Self-pay

## 2020-10-17 ENCOUNTER — Ambulatory Visit: Payer: BC Managed Care – PPO | Admitting: Family Medicine

## 2020-10-17 DIAGNOSIS — M545 Low back pain, unspecified: Secondary | ICD-10-CM

## 2020-10-17 MED ORDER — BACLOFEN 10 MG PO TABS
5.0000 mg | ORAL_TABLET | Freq: Three times a day (TID) | ORAL | 3 refills | Status: DC | PRN
Start: 2020-10-17 — End: 2021-02-18

## 2020-10-17 MED ORDER — PREDNISONE 10 MG PO TABS: ORAL_TABLET | ORAL | 0 refills | Status: DC

## 2020-10-17 MED ORDER — TRAMADOL HCL 50 MG PO TABS
50.0000 mg | ORAL_TABLET | Freq: Four times a day (QID) | ORAL | 0 refills | Status: DC | PRN
Start: 2020-10-17 — End: 2021-09-15

## 2020-10-17 NOTE — Progress Notes (Signed)
Office Visit Note   Patient: Brandi Parker           Date of Birth: 1956/06/30           MRN: [id number] Visit Date: 10/17/2020 Requested by: Laurey Morale, MD [place],  [place] [address] PCP: Laurey Morale, MD  Subjective: Chief Complaint  Patient presents with  . Right Leg - Pain    Right posterior thigh area since yesterday afternoon. Cramping pain. Hurts with standing. She says she started with pain in the right upper back area and rt arm 1 month ago after lifting boxes. Got better then went down to rt lower back.    HPI: She is here with right sided low back and leg pain.  Symptoms first started about a month ago when she developed upper back pain after lifting boxes.  It hurt for a few days and then went away.  Then recently her right lower back started to hurt and in the past couple days she has had severe pain radiating into the hamstring.  Pain is worse when trying to straighten her leg and put all of her weight on her leg.  Denies any bowel or bladder dysfunction.  She has tried over-the-counter medicines with minimal improvement.  She has not noticed a rash, no fevers or chills.              ROS:   All other systems were reviewed and are negative.  Objective: Vital Signs: There were no vitals taken for this visit.  Physical Exam:  General:  Alert and oriented, in no acute distress. Pulm:  Breathing unlabored. Psy:  Normal mood, congruent affect. Skin: No visible rash Low back: She has some tenderness near the right SI joint and that seems to reproduce some of her pain.  She is also very tender in the sciatic notch.  She has tenderness in the midportion of the hamstring muscle belly as well, and some muscular tightness.  Straight leg raise is negative, lower extremity strength and reflexes are normal.  No significant groin pain with hip internal rotation.  Imaging: No results found.  Assessment & Plan: 1.  Right-sided low back and hamstring pain,  etiology uncertain.  Could be a disc protrusion causing sciatica versus sacroiliac dysfunction. -We will try home exercises, prednisone, baclofen and tramadol as needed.  If symptoms persist, then physical therapy.  If that does not help, then x-rays and MRI scan versus SI joint injection.     Procedures: No procedures performed  No notes on file     PMFS History: Patient Active Problem List   Diagnosis Date Noted  . Unilateral primary osteoarthritis, left knee 01/09/2019  . Left knee pain 01/09/2019  . TMJ arthropathy 11/08/2016  . Complex sleep apnea syndrome 04/15/2015  . ANEMIA-NOS 05/16/2009  . Depression, major, single episode, mild ([place]) 05/16/2009  . Attention deficit hyperactivity disorder (ADHD) 05/16/2009  . ALLERGIC RHINITIS 05/16/2009  . URINARY INCONTINENCE 05/16/2009  . NEPHROLITHIASIS, HX OF 05/16/2009   Past Medical History:  Diagnosis Date  . ADHD (attention deficit hyperactivity disorder)   . Allergy   . Anemia   . Arthritis   . Brain injury ([place]) 1981   fell off a desk chair  . Chicken pox   . Depression   . Fracture, ankle   . Fracture, toe   . GERD (gastroesophageal reflux disease)   . Menopausal syndrome   . Migraines   . Nephrolithiasis   .  Osteopenia 01/24/09   dexa scan  . Sleep apnea    uses cpap 08/2020  . Sleep apnea syndrome    could not tolerate CPAP  . Urinary incontinence     Family History  Problem Relation Age of Onset  . Diabetes Mother        father  . Hyperlipidemia Mother   . Hypertension Mother   . Stroke Mother   . Prostate cancer Father   . Cancer Father        tumors in kidneys/tumors in liver  . Hashimoto's thyroiditis Daughter   . Hashimoto's thyroiditis Other        cousins x 2  . Colon cancer Neg Hx   . Colon polyps Neg Hx   . Esophageal cancer Neg Hx   . Rectal cancer Neg Hx   . Stomach cancer Neg Hx     Past Surgical History:  Procedure Laterality Date  . CATARACT EXTRACTION, BILATERAL  04/2020  .  COLONOSCOPY  10/14/09   Dr. Silvano Rusk, result clear repeat in 10 years  . DILATION AND CURETTAGE OF UTERUS    . KNEE ARTHROSCOPY  12-16-11   left knee, per Dr. Durward Fortes   . KNEE ARTHROSCOPY Right 02/22/2018   per Dr. Dorna Leitz , 2 Left arthroscopy-2015&2016  . REPAIR NONUNION / MALUNION METATARSAL / TARSAL BONES     right 5th  . TONSILLECTOMY     Social History   Occupational History  . Occupation: Pharmacist, hospital  Tobacco Use  . Smoking status: Never Smoker  . Smokeless tobacco: Never Used  Vaping Use  . Vaping Use: Never used  Substance and Sexual Activity  . Alcohol use: Yes    Alcohol/week: 0.0 standard drinks    Comment: occ  . Drug use: No  . Sexual activity: Not on file

## 2020-11-10 ENCOUNTER — Ambulatory Visit: Payer: Self-pay

## 2020-11-10 ENCOUNTER — Other Ambulatory Visit: Payer: Self-pay

## 2020-11-10 ENCOUNTER — Ambulatory Visit: Payer: BC Managed Care – PPO | Admitting: Orthopaedic Surgery

## 2020-11-10 ENCOUNTER — Encounter: Payer: Self-pay | Admitting: Orthopaedic Surgery

## 2020-11-10 VITALS — Ht 65.0 in | Wt 187.0 lb

## 2020-11-10 DIAGNOSIS — G8929 Other chronic pain: Secondary | ICD-10-CM

## 2020-11-10 DIAGNOSIS — M5441 Lumbago with sciatica, right side: Secondary | ICD-10-CM | POA: Diagnosis not present

## 2020-11-10 DIAGNOSIS — M545 Low back pain, unspecified: Secondary | ICD-10-CM | POA: Insufficient documentation

## 2020-11-10 NOTE — Progress Notes (Signed)
Office Visit Note   Patient: Brandi Parker           Date of Birth: [birth year]           MRN: [id number] Visit Date: 11/10/2020              Requested by: Laurey Morale, MD [place],  [place] [address] PCP: Laurey Morale, MD   Assessment & Plan: Visit Diagnoses:  1. Chronic right-sided low back pain with right-sided sciatica   2. Acute right-sided low back pain with right-sided sciatica     Plan: Acute onset of low back pain and right lower extremity paresthesias approximately 4 to 6 weeks ago.  Did see Dr. Junius Roads who placed her on tramadol, baclofen and prednisone without much relief.  Still having back pain and leg pain particularly when she stands or sits for any length of time.  X-rays demonstrate some degenerative changes at L5-S1.  I would be surprised if she has some stenosis.  Will order an MRI scan and consider treatment based on the results  Follow-Up Instructions: Return After MRI scan lumbar spine.   Orders:  Orders Placed This Encounter  Procedures  . XR Lumbar Spine 2-3 Views  . XR Pelvis 1-2 Views  . MR Lumbar Spine w/o contrast   No orders of the defined types were placed in this encounter.     Procedures: No procedures performed   Clinical Data: No additional findings.   Subjective: Chief Complaint  Patient presents with  . Lower Back - Pain  Patient presents today for her right lower back. She said that she saw Dr.Hilts a few weeks ago and was given prednisone and Tramadol to take. She said that the steroids helped minimally. Two weeks ago her pain worsened. She cannot stand or lay down due to pain. Sometimes her pain radiates all the way down her leg on the backside. She states that her right leg tingles. She takes Aleve daily, and Tramadol at night.   HPI  Review of Systems   Objective: Vital Signs: Ht 5\' 5"  (1.651 m)   Wt 187 lb (84.8 kg)   BMI 31.12 kg/m   Physical Exam Constitutional:      Appearance: She is  well-developed.  Eyes:     Pupils: Pupils are equal, round, and reactive to light.  Pulmonary:     Effort: Pulmonary effort is normal.  Skin:    General: Skin is warm and dry.  Neurological:     Mental Status: She is alert and oriented to person, place, and time.  Psychiatric:        Behavior: Behavior normal.     Ortho Exam awake alert and oriented x3.  Comfortable sitting.  Straight leg raise was minimally positive on the right for thigh pain negative on the left.  Right ankle reflex was absent.  Motor exam intact.  Painless range of motion both hips.  Some mild percussible tenderness L5-S1.  Figure 4 test negative for SI joint pain.  No localized areas of tenderness over either greater trochanter  Specialty Comments:  No specialty comments available.  Imaging: XR Lumbar Spine 2-3 Views  Result Date: 11/10/2020 Films of the lumbar spine were obtained in 2 projections.  There is narrowing of the L5-S1 disc space without listhesis.  There are some facet joint changes at the same level i.e. L5-S1.  Some anterior osteophyte formation along the entire lumbar spine.  No significant scoliosis.  No acute  changes or evidence of compression fracture  XR Pelvis 1-2 Views  Result Date: 11/10/2020 AP the pelvis did not demonstrate any abnormality about either hip.  Joint spaces are well-maintained without evidence of arthritis.  The left SI joint was a little irregular and decreased joint space but could be positional.  No other abnormalities identified    PMFS History: Patient Active Problem List   Diagnosis Date Noted  . Low back pain 11/10/2020  . Unilateral primary osteoarthritis, left knee 01/09/2019  . Left knee pain 01/09/2019  . TMJ arthropathy 11/08/2016  . Complex sleep apnea syndrome 04/15/2015  . ANEMIA-NOS 05/16/2009  . Depression, major, single episode, mild ([place]) 05/16/2009  . Attention deficit hyperactivity disorder (ADHD) 05/16/2009  . ALLERGIC RHINITIS 05/16/2009  .  URINARY INCONTINENCE 05/16/2009  . NEPHROLITHIASIS, HX OF 05/16/2009   Past Medical History:  Diagnosis Date  . ADHD (attention deficit hyperactivity disorder)   . Allergy   . Anemia   . Arthritis   . 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 Chicken pox   . Depression   . Fracture, ankle   . Fracture, toe   . GERD (gastroesophageal reflux disease)   . Menopausal syndrome   . Migraines   . Nephrolithiasis   . Osteopenia 01/24/09   dexa scan  . Sleep apnea    uses cpap 08/2020  . Sleep apnea syndrome    could not tolerate CPAP  . Urinary incontinence     Family History  Problem Relation Age of Onset  . Diabetes Mother        father  . Hyperlipidemia Mother   . Hypertension Mother   . Stroke Mother   . Prostate cancer Father   . Cancer Father        tumors in kidneys/tumors in liver  . Hashimoto's thyroiditis Daughter   . Hashimoto's thyroiditis Other        cousins x 2  . Colon cancer Neg Hx   . Colon polyps Neg Hx   . Esophageal cancer Neg Hx   . Rectal cancer Neg Hx   . Stomach cancer Neg Hx     Past Surgical History:  Procedure Laterality Date  . CATARACT EXTRACTION, BILATERAL  04/2020  . COLONOSCOPY  10/14/09   Dr. Silvano Rusk, result clear repeat in 10 years  . DILATION AND CURETTAGE OF UTERUS    . KNEE ARTHROSCOPY  12-16-11   left knee, per Dr. Durward Fortes   . KNEE ARTHROSCOPY Right 02/22/2018   per Dr. Dorna Leitz , 2 Left arthroscopy-2015&2016  . REPAIR NONUNION / MALUNION METATARSAL / TARSAL BONES     right 5th  . TONSILLECTOMY     Social History   Occupational History  . Occupation: Pharmacist, hospital  Tobacco Use  . Smoking status: Never Smoker  . Smokeless tobacco: Never Used  Vaping Use  . Vaping Use: Never used  Substance and Sexual Activity  . Alcohol use: Yes    Alcohol/week: 0.0 standard drinks    Comment: occ  . Drug use: No  . Sexual activity: Not on file

## 2020-11-20 ENCOUNTER — Other Ambulatory Visit: Payer: Self-pay

## 2020-11-20 ENCOUNTER — Ambulatory Visit
Admission: RE | Admit: 2020-11-20 | Discharge: 2020-11-20 | Disposition: A | Discharge status: Home / Self Care | Payer: BC Managed Care – PPO | Source: Ambulatory Visit | Attending: Orthopaedic Surgery | Admitting: Orthopaedic Surgery

## 2020-11-20 DIAGNOSIS — G8929 Other chronic pain: Secondary | ICD-10-CM

## 2020-11-20 DIAGNOSIS — M5441 Lumbago with sciatica, right side: Secondary | ICD-10-CM

## 2020-11-20 NOTE — Progress Notes (Signed)
Please call with results and refer to Dr Ernestina Patches for [employer]

## 2020-11-23 ENCOUNTER — Other Ambulatory Visit: Payer: BC Managed Care – PPO

## 2020-11-26 ENCOUNTER — Ambulatory Visit: Payer: BC Managed Care – PPO | Admitting: Orthopaedic Surgery

## 2020-11-26 ENCOUNTER — Telehealth: Payer: Self-pay | Admitting: Physical Medicine and Rehabilitation

## 2020-11-26 ENCOUNTER — Other Ambulatory Visit: Payer: Self-pay

## 2020-11-26 MED ORDER — METHOCARBAMOL 500 MG PO TABS: ORAL_TABLET | ORAL | 0 refills | Status: DC

## 2020-11-26 NOTE — Telephone Encounter (Signed)
Pt not req Auth#. 

## 2020-11-26 NOTE — Telephone Encounter (Signed)
Robaxin 500mg  #30 1 tab po bid prn-[facility] at [address]

## 2020-11-26 NOTE — Progress Notes (Signed)
roaxin

## 2020-11-26 NOTE — Telephone Encounter (Signed)
Please advise 

## 2020-11-26 NOTE — Telephone Encounter (Signed)
Patient is scheduled for our next available [facility] appointment on 12/21. She wants to know what she can do for the pain until then. Please advise.

## 2020-11-26 NOTE — Telephone Encounter (Signed)
Is auth needed for [phone]? Right S1 TF. Scheduled for 12/21.

## 2020-11-26 NOTE — Telephone Encounter (Signed)
sent 

## 2020-11-28 ENCOUNTER — Telehealth: Payer: Self-pay | Admitting: Orthopaedic Surgery

## 2020-11-28 MED ORDER — GABAPENTIN 100 MG PO CAPS: 100.0000 mg | ORAL_CAPSULE | Freq: Three times a day (TID) | ORAL | 0 refills | Status: DC

## 2020-11-28 NOTE — Telephone Encounter (Signed)
I sent the rx in earlier-need to check?

## 2020-11-28 NOTE — Telephone Encounter (Signed)
Please advise 

## 2020-11-28 NOTE — Telephone Encounter (Signed)
Pt called stating she was prescribed robaxin but she's getting a nerve block done on 12/09/20 and would like to have gabapentin called in instead to help with nerve pain   [phone]

## 2020-11-28 NOTE — Telephone Encounter (Signed)
Resending

## 2020-12-02 ENCOUNTER — Other Ambulatory Visit: Payer: Self-pay

## 2020-12-09 ENCOUNTER — Other Ambulatory Visit: Payer: Self-pay

## 2020-12-09 ENCOUNTER — Encounter: Payer: Self-pay | Admitting: Physical Medicine and Rehabilitation

## 2020-12-09 ENCOUNTER — Ambulatory Visit: Payer: BC Managed Care – PPO | Admitting: Physical Medicine and Rehabilitation

## 2020-12-09 ENCOUNTER — Ambulatory Visit: Payer: Self-pay

## 2020-12-09 VITALS — BP 161/96 | HR 91

## 2020-12-09 DIAGNOSIS — M5416 Radiculopathy, lumbar region: Secondary | ICD-10-CM | POA: Diagnosis not present

## 2020-12-09 MED ORDER — GABAPENTIN 100 MG PO CAPS
100.0000 mg | ORAL_CAPSULE | Freq: Three times a day (TID) | ORAL | 1 refills | Status: DC
Start: 2020-12-09 — End: 2021-01-14

## 2020-12-09 MED ORDER — METHYLPREDNISOLONE ACETATE 80 MG/ML IJ SUSP
80.0000 mg | Freq: Once | INTRAMUSCULAR | Status: AC
  Administered 2020-12-09: 80 mg

## 2020-12-09 NOTE — Procedures (Signed)
S1 Lumbosacral Transforaminal Epidural Steroid Injection - Sub-Pedicular Approach with Fluoroscopic Guidance   Patient: Brandi Parker      Date of Birth: 1956-08-06 MRN: [id number] PCP: Laurey Morale, MD      Visit Date: 12/09/2020   Universal Protocol:    Date/Time: 12/21/211:04 PM  Consent Given By: the patient  Position:  PRONE  Additional Comments: Vital signs were monitored before and after the procedure. Patient was prepped and draped in the usual sterile fashion. The correct patient, procedure, and site was verified.   Injection Procedure Details:  Procedure Site One Meds Administered:  Meds ordered this encounter  Medications  . methylPREDNISolone acetate (DEPO-MEDROL) injection 80 mg    Laterality: Right  Location/Site:  S1 Foramen   Needle size: 22 ga.  Needle type: Spinal  Needle Placement: Transforaminal  Findings:   -Comments: Excellent flow of contrast along the nerve, nerve root and into the epidural space.  Epidurogram: Contrast epidurogram showed no nerve root cut off or restricted flow pattern.  Procedure Details: After squaring off the sacral end-plate to get a true AP view, the C-arm was positioned so that the best possible view of the S1 foramen was visualized. The soft tissues overlying this structure were infiltrated with 2-3 ml. of 1% Lidocaine without Epinephrine.    The spinal needle was inserted toward the target using a "trajectory" view along the fluoroscope beam.  Under AP and lateral visualization, the needle was advanced so it did not puncture dura. Biplanar projections were used to confirm position. Aspiration was confirmed to be negative for CSF and/or blood. A 1-2 ml. volume of Isovue-250 was injected and flow of contrast was noted at each level. Radiographs were obtained for documentation purposes.   After attaining the desired flow of contrast documented above, a 0.5 to 1.0 ml test dose of 0.25% Marcaine was injected into each  respective transforaminal space.  The patient was observed for 90 seconds post injection.  After no sensory deficits were reported, and normal lower extremity motor function was noted,   the above injectate was administered so that equal amounts of the injectate were placed at each foramen (level) into the transforaminal epidural space.   Additional Comments:  The patient tolerated the procedure well Dressing: Band-Aid with 2 x 2 sterile gauze    Post-procedure details: Patient was observed during the procedure. Post-procedure instructions were reviewed.  Patient left the clinic in stable condition.

## 2020-12-09 NOTE — Progress Notes (Signed)
Pt state lower back pain that travels down to her right buttock to the posterior of her right knee. Pt state standing and bending makes the pain worse. Pt state pain meds and laying on her left side helps ease the pain.   Numeric Pain Rating Scale and Functional Assessment Average Pain 5   In the last MONTH (on 0-10 scale) has pain interfered with the following?  1. General activity like being  able to carry out your everyday physical activities such as walking, climbing stairs, carrying groceries, or moving a chair?  Rating(8)   +Driver, -BT, -Dye Allergies.

## 2020-12-09 NOTE — Progress Notes (Signed)
Brandi Parker - 64 y.o. female MRN [id number]  Date of birth: 11-05-1956  Office Visit Note: Visit Date: 12/09/2020 PCP: Laurey Morale, MD Referred by: Laurey Morale, MD  Subjective: Chief Complaint  Patient presents with  . Lower Back - Pain  . Right Knee - Pain   HPI:  Brandi Parker is a 64 y.o. female who comes in today at the request of Dr. Joni Fears for planned Right S1-2 Lumbar epidural steroid injection with fluoroscopic guidance.  The patient has failed conservative care including home exercise, medications, time and activity modification.  This injection will be diagnostic and hopefully therapeutic.  Please see requesting physician notes for further details and justification.  MRI reviewed with images and spine model.  MRI reviewed in the note below.    ROS Otherwise per HPI.  Assessment & Plan: Visit Diagnoses:    ICD-10-CM   1. Lumbar radiculopathy  M54.16 XR C-ARM NO REPORT    Epidural Steroid injection    methylPREDNISolone acetate (DEPO-MEDROL) injection 80 mg    Plan: No additional findings.   Meds & Orders:  Meds ordered this encounter  Medications  . methylPREDNISolone acetate (DEPO-MEDROL) injection 80 mg  . gabapentin (NEURONTIN) 100 MG capsule    Sig: Take 1 capsule (100 mg total) by mouth 3 (three) times daily.    Dispense:  90 capsule    Refill:  1    Orders Placed This Encounter  Procedures  . XR C-ARM NO REPORT  . Epidural Steroid injection    Follow-up: Return if symptoms worsen or fail to improve.   Procedures: No procedures performed  S1 Lumbosacral Transforaminal Epidural Steroid Injection - Sub-Pedicular Approach with Fluoroscopic Guidance   Patient: Brandi Parker      Date of Birth: 06-16-1956 MRN: [id number] PCP: Laurey Morale, MD      Visit Date: 12/09/2020   Universal Protocol:    Date/Time: 12/21/211:04 PM  Consent Given By: the patient  Position:  PRONE  Additional Comments: Vital signs were monitored  before and after the procedure. Patient was prepped and draped in the usual sterile fashion. The correct patient, procedure, and site was verified.   Injection Procedure Details:  Procedure Site One Meds Administered:  Meds ordered this encounter  Medications  . methylPREDNISolone acetate (DEPO-MEDROL) injection 80 mg    Laterality: Right  Location/Site:  S1 Foramen   Needle size: 22 ga.  Needle type: Spinal  Needle Placement: Transforaminal  Findings:   -Comments: Excellent flow of contrast along the nerve, nerve root and into the epidural space.  Epidurogram: Contrast epidurogram showed no nerve root cut off or restricted flow pattern.  Procedure Details: After squaring off the sacral end-plate to get a true AP view, the C-arm was positioned so that the best possible view of the S1 foramen was visualized. The soft tissues overlying this structure were infiltrated with 2-3 ml. of 1% Lidocaine without Epinephrine.    The spinal needle was inserted toward the target using a "trajectory" view along the fluoroscope beam.  Under AP and lateral visualization, the needle was advanced so it did not puncture dura. Biplanar projections were used to confirm position. Aspiration was confirmed to be negative for CSF and/or blood. A 1-2 ml. volume of Isovue-250 was injected and flow of contrast was noted at each level. Radiographs were obtained for documentation purposes.   After attaining the desired flow of contrast documented above, a 0.5 to 1.0 ml test dose of 0.25%  Marcaine was injected into each respective transforaminal space.  The patient was observed for 90 seconds post injection.  After no sensory deficits were reported, and normal lower extremity motor function was noted,   the above injectate was administered so that equal amounts of the injectate were placed at each foramen (level) into the transforaminal epidural space.   Additional Comments:  The patient tolerated the  procedure well Dressing: Band-Aid with 2 x 2 sterile gauze    Post-procedure details: Patient was observed during the procedure. Post-procedure instructions were reviewed.  Patient left the clinic in stable condition.     Clinical History: MRI LUMBAR SPINE WITHOUT CONTRAST  TECHNIQUE: Multiplanar, multisequence MR imaging of the lumbar spine was performed. No intravenous contrast was administered.  COMPARISON:  Lumbar spine radiograph 11/10/2020.  FINDINGS: Segmentation: 5 lumbar vertebrae (correlating with prior lumbar spine radiographs 11/10/2020).  Alignment:  Trace L1-L2 grade 1 retrolisthesis.  Vertebrae: Vertebral body height is maintained. Mild degenerative endplate irregularity at L4-L5 and L5-S1. Mild fatty degenerative endplate marrow signal also present at these levels. Trace degenerative endplate edema at O9-G2 and L4-L5. No focal suspicious osseous lesion. Multilevel ventrolateral osteophytes.  Conus medullaris and cauda equina: Conus extends to the L1-L2 level. No signal abnormality within the visualized distal spinal cord.  Paraspinal and other soft tissues: No abnormality identified within included portions of the abdomen/retroperitoneum. Paraspinal soft tissues within normal limits.  Disc levels:  Mild disc degeneration throughout the lumbar spine.  T12-L1: Mild facet arthrosis. No significant disc herniation or stenosis.  L1-L2: Trace retrolisthesis. Disc bulge. Mild facet arthrosis. Mild relative spinal canal narrowing without nerve root impingement. No significant foraminal stenosis.  L2-L3: Disc bulge. Superimposed small right foraminal disc protrusion. Mild/moderate facet arthrosis with minimal ligamentum flavum hypertrophy. Mild bilateral subarticular and central canal narrowing without frank nerve root impingement. No significant foraminal stenosis.  L3-L4: Central zone posterior annular fissure. Disc bulge. Mild facet  arthrosis and ligamentum flavum hypertrophy. Mild bilateral subarticular and central canal narrowing without frank nerve root impingement. Mild bilateral neural foraminal narrowing (greater on the right).  L4-L5: Disc bulge. Superimposed shallow broad-based right subarticular/right foraminal disc protrusion at site of posterior annular fissure. Mild/moderate facet arthrosis with ligamentum flavum hypertrophy. Mild/moderate bilateral subarticular narrowing with slight crowding of the left greater than right descending L5 nerve root. Mild central canal stenosis. Mild right neural foraminal narrowing.  L5-S1: Disc bulge asymmetric to the right. Superimposed central disc protrusion eccentric to the right. Disc osteophyte ridge greatest along the right lateral aspect of the disc space. Mild facet arthrosis with ligamentum flavum hypertrophy. Moderate right subarticular stenosis with crowding of the descending right S1 nerve root (series 6, image 33). Mild left subarticular and central canal narrowing. Mild/moderate right neural foraminal narrowing.  IMPRESSION: Lumbar spondylosis as outlined with findings most notably as follows.  At L5-S1, a central disc protrusion eccentric to the right contributes to moderate right subarticular stenosis with crowding of the descending right S1 nerve root. Correlate for right S1 radiculopathy. Multifactorial mild left subarticular and central canal narrowing, as well as mild/moderate right neural foraminal narrowing.  At L4-L5, there is multifactorial mild/moderate bilateral subarticular narrowing with slight crowding of the left greater than right descending L5 nerve roots.  No more than mild spinal canal stenosis at the remaining levels. Additional sites of mild neural foraminal narrowing as described.   Electronically Signed   By: Kellie Simmering DO   On: 11/20/2020 12:27     Objective:  VS:  HT:    WT:   BMI:     BP:(!) 161/96   HR:91bpm  TEMP: ( )  RESP:  Physical Exam Constitutional:      General: She is not in acute distress.    Appearance: Normal appearance. She is not ill-appearing.  HENT:     Head: Normocephalic and atraumatic.     Right Ear: External ear normal.     Left Ear: External ear normal.  Eyes:     Extraocular Movements: Extraocular movements intact.  Cardiovascular:     Rate and Rhythm: Normal rate.     Pulses: Normal pulses.  Musculoskeletal:     Right lower leg: No edema.     Left lower leg: No edema.     Comments: Patient has good distal strength with no pain over the greater trochanters.  No clonus or focal weakness.  Skin:    Findings: No erythema, lesion or rash.  Neurological:     General: No focal deficit present.     Mental Status: She is alert and oriented to person, place, and time.     Sensory: No sensory deficit.     Motor: No weakness or abnormal muscle tone.     Coordination: Coordination normal.  Psychiatric:        Mood and Affect: Mood normal.        Behavior: Behavior normal.      Imaging: No results found.

## 2021-01-13 ENCOUNTER — Telehealth: Payer: Self-pay | Admitting: Physical Medicine and Rehabilitation

## 2021-01-13 ENCOUNTER — Telehealth: Payer: Self-pay | Admitting: Orthopaedic Surgery

## 2021-01-13 NOTE — Telephone Encounter (Signed)
Check with Dr Ernestina Patches who prescribed gabapentin-thanks

## 2021-01-13 NOTE — Telephone Encounter (Signed)
Please advise 

## 2021-01-13 NOTE — Telephone Encounter (Signed)
Probably should try interlam esi, we did S1 tf esi. Send me message back so I can rx Gabapentin

## 2021-01-13 NOTE — Telephone Encounter (Signed)
Please see below and advise. Thanks

## 2021-01-13 NOTE — Telephone Encounter (Signed)
Is auth needed for L5-S1 IL? Scheduled for 2/7 with driver and no blood thinners.

## 2021-01-13 NOTE — Telephone Encounter (Signed)
Pt states nerve block did not work and wonder if you can up dosage on gabapentin. Please give her a call at [phone]

## 2021-01-13 NOTE — Telephone Encounter (Signed)
Scheduled for 2/7 for IL esi.

## 2021-01-13 NOTE — Telephone Encounter (Signed)
Pt not req Auth#. 

## 2021-01-14 ENCOUNTER — Telehealth: Payer: Self-pay | Admitting: Physical Medicine and Rehabilitation

## 2021-01-14 ENCOUNTER — Other Ambulatory Visit: Payer: Self-pay | Admitting: Physical Medicine and Rehabilitation

## 2021-01-14 DIAGNOSIS — M792 Neuralgia and neuritis, unspecified: Secondary | ICD-10-CM

## 2021-01-14 MED ORDER — GABAPENTIN 300 MG PO CAPS: ORAL_CAPSULE | ORAL | 0 refills | Status: DC

## 2021-01-14 NOTE — Progress Notes (Signed)
Gabapentin taper up

## 2021-01-14 NOTE — Telephone Encounter (Signed)
Rx done. 

## 2021-01-14 NOTE — Telephone Encounter (Signed)
Please advise 

## 2021-01-14 NOTE — Telephone Encounter (Signed)
Called patient to advise  °

## 2021-01-14 NOTE — Telephone Encounter (Signed)
Patient called. Says Dr. Ernestina Patches was suppose to call in a new RX to [facility] on [address]. She would like to know if that RX has been sent in. Her call back number is [phone]

## 2021-01-14 NOTE — Telephone Encounter (Signed)
Done

## 2021-01-26 ENCOUNTER — Other Ambulatory Visit: Payer: Self-pay

## 2021-01-26 ENCOUNTER — Ambulatory Visit: Payer: Self-pay

## 2021-01-26 ENCOUNTER — Ambulatory Visit: Payer: BC Managed Care – PPO | Admitting: Physical Medicine and Rehabilitation

## 2021-01-26 ENCOUNTER — Encounter: Payer: Self-pay | Admitting: Physical Medicine and Rehabilitation

## 2021-01-26 VITALS — BP 156/93 | HR 85

## 2021-01-26 DIAGNOSIS — M5416 Radiculopathy, lumbar region: Secondary | ICD-10-CM | POA: Diagnosis not present

## 2021-01-26 MED ORDER — BETAMETHASONE SOD PHOS & ACET 6 (3-3) MG/ML IJ SUSP
12.0000 mg | Freq: Once | INTRAMUSCULAR | Status: AC
  Administered 2021-01-26: 12 mg

## 2021-01-26 NOTE — Progress Notes (Signed)
Pt state lower back pain that travels to her right buttocks area. Pt state standing and sitting for a long time makes the pain worse. Pt state she take pain meds and heating pads to ease her pain. Pt has hx of inj on 12/09/20 pt state  Numeric Pain Rating Scale and Functional Assessment Average Pain 7   In the last MONTH (on 0-10 scale) has pain interfered with the following?  1. General activity like being  able to carry out your everyday physical activities such as walking, climbing stairs, carrying groceries, or moving a chair?  Rating(10)   +Driver, -BT, -Dye Allergies.

## 2021-01-26 NOTE — Progress Notes (Signed)
Brandi Parker - 65 y.o. female MRN [id number]  Date of birth: [birth year]  Office Visit Note: Visit Date: 01/26/2021 PCP: Laurey Morale, MD Referred by: Laurey Morale, MD  Subjective: Chief Complaint  Patient presents with  . Lower Back - Pain   HPI:  Brandi Parker is a 65 y.o. female who comes in today at the request of Dr. Joni Fears for planned Right L5-S1 Lumbar epidural steroid injection with fluoroscopic guidance.  The patient has failed conservative care including home exercise, medications, time and activity modification.  This injection will be diagnostic and hopefully therapeutic.  Please see requesting physician notes for further details and justification.  MRI reviewed with images and spine model.  MRI reviewed in the note below.  She really did not get any relief with the S1 transforaminal approach.  Within a try interlaminar approach through what is apparent to be a radicular pain more L5-S1 distribution down into the leg.  Her biggest pain complaint is right buttock pain worse with sitting but it can be worse with standing and ambulating as well.  Consideration if this does not help for physical therapy to look at ischial bursitis versus piriformis issues.  Could consider ischial injection.  Can follow-up with Dr. Joni Fears.  ROS Otherwise per HPI.  Assessment & Plan: Visit Diagnoses:    ICD-10-CM   1. Lumbar radiculopathy  M54.16 XR C-ARM NO REPORT    Epidural Steroid injection    betamethasone acetate-betamethasone sodium phosphate (CELESTONE) injection 12 mg    Plan: No additional findings.   Meds & Orders:  Meds ordered this encounter  Medications  . betamethasone acetate-betamethasone sodium phosphate (CELESTONE) injection 12 mg    Orders Placed This Encounter  Procedures  . XR C-ARM NO REPORT  . Epidural Steroid injection    Follow-up: Return for visit to requesting physician as needed.   Procedures: No procedures performed  Lumbar  Epidural Steroid Injection - Interlaminar Approach with Fluoroscopic Guidance  Patient: Brandi Parker      Date of Birth: [birth year] MRN: [id number] PCP: Laurey Morale, MD      Visit Date: 01/26/2021   Universal Protocol:     Consent Given By: the patient  Position: PRONE  Additional Comments: Vital signs were monitored before and after the procedure. Patient was prepped and draped in the usual sterile fashion. The correct patient, procedure, and site was verified.   Injection Procedure Details:   Procedure diagnoses: Lumbar radiculopathy [M54.16]   Meds Administered:  Meds ordered this encounter  Medications  . betamethasone acetate-betamethasone sodium phosphate (CELESTONE) injection 12 mg     Laterality: Right  Location/Site:  L5-S1  Needle: 3.5 in., 20 ga. Tuohy  Needle Placement: Paramedian epidural  Findings:   -Comments: Excellent flow of contrast into the epidural space.  Procedure Details: Using a paramedian approach from the side mentioned above, the region overlying the inferior lamina was localized under fluoroscopic visualization and the soft tissues overlying this structure were infiltrated with 4 ml. of 1% Lidocaine without Epinephrine. The Tuohy needle was inserted into the epidural space using a paramedian approach.   The epidural space was localized using loss of resistance along with counter oblique bi-planar fluoroscopic views.  After negative aspirate for air, blood, and CSF, a 2 ml. volume of Isovue-250 was injected into the epidural space and the flow of contrast was observed. Radiographs were obtained for documentation purposes.    The injectate was administered into the level noted  above.   Additional Comments:  The patient tolerated the procedure well Dressing: 2 x 2 sterile gauze and Band-Aid    Post-procedure details: Patient was observed during the procedure. Post-procedure instructions were reviewed.  Patient left the clinic in  stable condition.     Clinical History: MRI LUMBAR SPINE WITHOUT CONTRAST  TECHNIQUE: Multiplanar, multisequence MR imaging of the lumbar spine was performed. No intravenous contrast was administered.  COMPARISON:  Lumbar spine radiograph 11/10/2020.  FINDINGS: Segmentation: 5 lumbar vertebrae (correlating with prior lumbar spine radiographs 11/10/2020).  Alignment:  Trace L1-L2 grade 1 retrolisthesis.  Vertebrae: Vertebral body height is maintained. Mild degenerative endplate irregularity at L4-L5 and L5-S1. Mild fatty degenerative endplate marrow signal also present at these levels. Trace degenerative endplate edema at G2-R4 and L4-L5. No focal suspicious osseous lesion. Multilevel ventrolateral osteophytes.  Conus medullaris and cauda equina: Conus extends to the L1-L2 level. No signal abnormality within the visualized distal spinal cord.  Paraspinal and other soft tissues: No abnormality identified within included portions of the abdomen/retroperitoneum. Paraspinal soft tissues within normal limits.  Disc levels:  Mild disc degeneration throughout the lumbar spine.  T12-L1: Mild facet arthrosis. No significant disc herniation or stenosis.  L1-L2: Trace retrolisthesis. Disc bulge. Mild facet arthrosis. Mild relative spinal canal narrowing without nerve root impingement. No significant foraminal stenosis.  L2-L3: Disc bulge. Superimposed small right foraminal disc protrusion. Mild/moderate facet arthrosis with minimal ligamentum flavum hypertrophy. Mild bilateral subarticular and central canal narrowing without frank nerve root impingement. No significant foraminal stenosis.  L3-L4: Central zone posterior annular fissure. Disc bulge. Mild facet arthrosis and ligamentum flavum hypertrophy. Mild bilateral subarticular and central canal narrowing without frank nerve root impingement. Mild bilateral neural foraminal narrowing (greater on the  right).  L4-L5: Disc bulge. Superimposed shallow broad-based right subarticular/right foraminal disc protrusion at site of posterior annular fissure. Mild/moderate facet arthrosis with ligamentum flavum hypertrophy. Mild/moderate bilateral subarticular narrowing with slight crowding of the left greater than right descending L5 nerve root. Mild central canal stenosis. Mild right neural foraminal narrowing.  L5-S1: Disc bulge asymmetric to the right. Superimposed central disc protrusion eccentric to the right. Disc osteophyte ridge greatest along the right lateral aspect of the disc space. Mild facet arthrosis with ligamentum flavum hypertrophy. Moderate right subarticular stenosis with crowding of the descending right S1 nerve root (series 6, image 33). Mild left subarticular and central canal narrowing. Mild/moderate right neural foraminal narrowing.  IMPRESSION: Lumbar spondylosis as outlined with findings most notably as follows.  At L5-S1, a central disc protrusion eccentric to the right contributes to moderate right subarticular stenosis with crowding of the descending right S1 nerve root. Correlate for right S1 radiculopathy. Multifactorial mild left subarticular and central canal narrowing, as well as mild/moderate right neural foraminal narrowing.  At L4-L5, there is multifactorial mild/moderate bilateral subarticular narrowing with slight crowding of the left greater than right descending L5 nerve roots.  No more than mild spinal canal stenosis at the remaining levels. Additional sites of mild neural foraminal narrowing as described.   Electronically Signed   By: Kellie Simmering DO   On: 11/20/2020 12:27     Objective:  VS:  HT:    WT:   BMI:     BP:(!) 156/93  HR:85bpm  TEMP: ( )  RESP:  Physical Exam Vitals and nursing note reviewed.  Constitutional:      General: She is not in acute distress.    Appearance: Normal appearance. She is not  ill-appearing.  HENT:  Head: Normocephalic and atraumatic.     Right Ear: External ear normal.     Left Ear: External ear normal.  Eyes:     Extraocular Movements: Extraocular movements intact.  Cardiovascular:     Rate and Rhythm: Normal rate.     Pulses: Normal pulses.  Pulmonary:     Effort: Pulmonary effort is normal. No respiratory distress.  Abdominal:     General: There is no distension.     Palpations: Abdomen is soft.  Musculoskeletal:        General: Tenderness present.     Cervical back: Neck supple.     Right lower leg: No edema.     Left lower leg: No edema.     Comments: Patient has good distal strength with no pain over the greater trochanters.  No clonus or focal weakness.  Skin:    Findings: No erythema, lesion or rash.  Neurological:     General: No focal deficit present.     Mental Status: She is alert and oriented to person, place, and time.     Sensory: No sensory deficit.     Motor: No weakness or abnormal muscle tone.     Coordination: Coordination normal.  Psychiatric:        Mood and Affect: Mood normal.        Behavior: Behavior normal.      Imaging: No results found.

## 2021-01-26 NOTE — Patient Instructions (Signed)

## 2021-01-26 NOTE — Procedures (Signed)
Lumbar Epidural Steroid Injection - Interlaminar Approach with Fluoroscopic Guidance  Patient: Brandi Parker      Date of Birth: 1956-11-05 MRN: [id number] PCP: Laurey Morale, MD      Visit Date: 01/26/2021   Universal Protocol:     Consent Given By: the patient  Position: PRONE  Additional Comments: Vital signs were monitored before and after the procedure. Patient was prepped and draped in the usual sterile fashion. The correct patient, procedure, and site was verified.   Injection Procedure Details:   Procedure diagnoses: Lumbar radiculopathy [M54.16]   Meds Administered:  Meds ordered this encounter  Medications  . betamethasone acetate-betamethasone sodium phosphate (CELESTONE) injection 12 mg     Laterality: Right  Location/Site:  L5-S1  Needle: 3.5 in., 20 ga. Tuohy  Needle Placement: Paramedian epidural  Findings:   -Comments: Excellent flow of contrast into the epidural space.  Procedure Details: Using a paramedian approach from the side mentioned above, the region overlying the inferior lamina was localized under fluoroscopic visualization and the soft tissues overlying this structure were infiltrated with 4 ml. of 1% Lidocaine without Epinephrine. The Tuohy needle was inserted into the epidural space using a paramedian approach.   The epidural space was localized using loss of resistance along with counter oblique bi-planar fluoroscopic views.  After negative aspirate for air, blood, and CSF, a 2 ml. volume of Isovue-250 was injected into the epidural space and the flow of contrast was observed. Radiographs were obtained for documentation purposes.    The injectate was administered into the level noted above.   Additional Comments:  The patient tolerated the procedure well Dressing: 2 x 2 sterile gauze and Band-Aid    Post-procedure details: Patient was observed during the procedure. Post-procedure instructions were reviewed.  Patient left the  clinic in stable condition.

## 2021-02-04 ENCOUNTER — Telehealth: Payer: Self-pay | Admitting: Orthopaedic Surgery

## 2021-02-04 DIAGNOSIS — G8929 Other chronic pain: Secondary | ICD-10-CM

## 2021-02-04 NOTE — Telephone Encounter (Signed)
Patient called advised most of the pain in her lower hip area has reduced. Patient said she still have pain in the back of her thigh. Patient asked if Dr Durward Fortes recommends (PT) The number to contact patient is [phone]

## 2021-02-06 NOTE — Telephone Encounter (Signed)
Please advise 

## 2021-02-09 MED ORDER — METHOCARBAMOL 500 MG PO TABS: ORAL_TABLET | ORAL | 0 refills | Status: DC

## 2021-02-09 NOTE — Telephone Encounter (Signed)
Referral entered for PT. Patient aware.  She states that injection given by Dr. Ernestina Patches on 01/26/2021 helped for a week. She is now experiencing the same pain and it is in the hip.  She is taking gabapentin 300mg  tid and tylenol. She has to leave for [facility] on Thursday. Is there anything else that you recommend?

## 2021-02-09 NOTE — Telephone Encounter (Signed)
Ok for [employer] and treat

## 2021-02-09 NOTE — Telephone Encounter (Signed)
Robaxin 500mg  po bid prn #30 sent to pharmacy.  Patient aware.

## 2021-02-09 NOTE — Telephone Encounter (Signed)
Robaxin 500mg  1 tab po bid prn

## 2021-02-10 ENCOUNTER — Other Ambulatory Visit: Payer: Self-pay

## 2021-02-10 MED ORDER — METHOCARBAMOL 500 MG PO TABS: ORAL_TABLET | ORAL | 0 refills | Status: DC

## 2021-02-15 ENCOUNTER — Other Ambulatory Visit: Payer: Self-pay | Admitting: Physical Medicine and Rehabilitation

## 2021-02-15 DIAGNOSIS — M792 Neuralgia and neuritis, unspecified: Secondary | ICD-10-CM

## 2021-02-16 ENCOUNTER — Other Ambulatory Visit: Payer: Self-pay

## 2021-02-16 NOTE — Telephone Encounter (Signed)
Please advise 

## 2021-02-17 ENCOUNTER — Ambulatory Visit: Payer: BC Managed Care – PPO | Admitting: Family Medicine

## 2021-02-17 DIAGNOSIS — Z0289 Encounter for other administrative examinations: Secondary | ICD-10-CM

## 2021-02-18 ENCOUNTER — Ambulatory Visit: Payer: BC Managed Care – PPO | Admitting: Family Medicine

## 2021-02-18 ENCOUNTER — Other Ambulatory Visit: Payer: Self-pay

## 2021-02-18 ENCOUNTER — Encounter: Payer: Self-pay | Admitting: Family Medicine

## 2021-02-18 VITALS — BP 170/100 | HR 86 | Temp 98.4°F | Wt 193.0 lb

## 2021-02-18 DIAGNOSIS — I10 Essential (primary) hypertension: Secondary | ICD-10-CM | POA: Insufficient documentation

## 2021-02-18 DIAGNOSIS — R739 Hyperglycemia, unspecified: Secondary | ICD-10-CM | POA: Diagnosis not present

## 2021-02-18 DIAGNOSIS — F418 Other specified anxiety disorders: Secondary | ICD-10-CM | POA: Diagnosis not present

## 2021-02-18 DIAGNOSIS — R251 Tremor, unspecified: Secondary | ICD-10-CM

## 2021-02-18 LAB — HEMOGLOBIN A1C: Hgb A1c MFr Bld: 5.9 % (ref 4.6–6.5)

## 2021-02-18 MED ORDER — ESCITALOPRAM OXALATE 20 MG PO TABS: 20.0000 mg | ORAL_TABLET | Freq: Every day | ORAL | 5 refills | Status: DC

## 2021-02-18 MED ORDER — METOPROLOL SUCCINATE ER 50 MG PO TB24: 50.0000 mg | ORAL_TABLET | Freq: Every day | ORAL | 3 refills | Status: DC

## 2021-02-18 NOTE — Progress Notes (Signed)
   Subjective:    Patient ID: Brandi Parker, female    DOB: 1956/10/26, 65 y.o.   MRN: [id number]  HPI Here for several issues. First she has noticed tremors in both hands which started about 6 months ago these have gotten worse over the past few weeks. She thinks this is a response to stress, at least in part. She has been dealing with low back pain and has had some family issues to deal with. At the same time her BP has been going up, and the systolic readings have been in the 150s and 160s for a few months. Part of this is likely the result of gaining a lot of weight, and she admits to stress eating. She is worried this may give her diabetes, since this runs in her family. Her fasting glucose last July was 101.    Review of Systems  Constitutional: Negative.   Respiratory: Negative.   Cardiovascular: Negative.   Neurological: Positive for tremors.  Psychiatric/Behavioral: Positive for decreased concentration. Negative for agitation, confusion and dysphoric mood. The patient is nervous/anxious.        Objective:   Physical Exam Constitutional:      General: She is not in acute distress. Cardiovascular:     Rate and Rhythm: Normal rate and regular rhythm.     Pulses: Normal pulses.     Heart sounds: Normal heart sounds.  Pulmonary:     Effort: Pulmonary effort is normal.     Breath sounds: Normal breath sounds.  Neurological:     Mental Status: She is alert and oriented to person, place, and time.     Cranial Nerves: No cranial nerve deficit.     Motor: No weakness.     Gait: Gait normal.     Comments: Fine resting tremors in both hands   Psychiatric:        Mood and Affect: Mood normal.        Behavior: Behavior normal.        Thought Content: Thought content normal.        Judgment: Judgment normal.           Assessment & Plan:  Since she has been dealing with more anxiety than depression lately, we agreed to stop the Wellbutrin and to increase the Lexapro to 20 mg  daily. We will refer her to Nutrition to assist with weight loss. For the HTN she will start on Metoprolol succinate 50 mg daily, and this will likely help with the tremors as well. Check an A1c today. Recheck in 3-4 weeks.  Alysia Penna, MD

## 2021-02-20 ENCOUNTER — Ambulatory Visit: Payer: BC Managed Care – PPO | Admitting: Rehabilitative and Restorative Service Providers"

## 2021-02-20 ENCOUNTER — Encounter: Payer: Self-pay | Admitting: Rehabilitative and Restorative Service Providers"

## 2021-02-20 ENCOUNTER — Other Ambulatory Visit: Payer: Self-pay

## 2021-02-20 DIAGNOSIS — R293 Abnormal posture: Secondary | ICD-10-CM

## 2021-02-20 DIAGNOSIS — R262 Difficulty in walking, not elsewhere classified: Secondary | ICD-10-CM

## 2021-02-20 DIAGNOSIS — G8929 Other chronic pain: Secondary | ICD-10-CM

## 2021-02-20 DIAGNOSIS — M6281 Muscle weakness (generalized): Secondary | ICD-10-CM | POA: Diagnosis not present

## 2021-02-20 DIAGNOSIS — M79604 Pain in right leg: Secondary | ICD-10-CM

## 2021-02-20 DIAGNOSIS — M5441 Lumbago with sciatica, right side: Secondary | ICD-10-CM

## 2021-02-20 NOTE — Therapy (Signed)
[facility] Physical Therapy [address] [place], [place], [address] Phone: [phone]   Fax:  [phone]  Physical Therapy Evaluation  Patient Details  Name: Brandi Parker MRN: [id number] Date of Birth: 1956-01-10 Referring Provider (PT): Dr. Durward Fortes   Encounter Date: 02/20/2021   PT End of Session - 02/20/21 0924    Visit Number 1    Number of Visits 12    Date for PT Re-Evaluation 04/17/21    Progress Note Due on Visit 10    PT Start Time 0930    PT Stop Time 1020    PT Time Calculation (min) 50 min    Activity Tolerance Patient limited by pain    Behavior During Therapy [facility] for tasks assessed/performed           Past Medical History:  Diagnosis Date  . ADHD (attention deficit hyperactivity disorder)   . Allergy   . Anemia   . Arthritis   . Brain injury ([place]) 1981   fell off a desk chair  . Chicken pox   . Depression   . Fracture, ankle   . Fracture, toe   . GERD (gastroesophageal reflux disease)   . Menopausal syndrome   . Migraines   . Nephrolithiasis   . Osteopenia 01/24/09   dexa scan  . Sleep apnea    uses cpap 08/2020  . Sleep apnea syndrome    could not tolerate CPAP  . Urinary incontinence     Past Surgical History:  Procedure Laterality Date  . CATARACT EXTRACTION, BILATERAL  04/2020  . COLONOSCOPY  10/14/09   Dr. Silvano Rusk, result clear repeat in 10 years  . DILATION AND CURETTAGE OF UTERUS    . KNEE ARTHROSCOPY  12-16-11   left knee, per Dr. Durward Fortes   . KNEE ARTHROSCOPY Right 02/22/2018   per Dr. Dorna Leitz , 2 Left arthroscopy-2015&2016  . REPAIR NONUNION / MALUNION METATARSAL / TARSAL BONES     right 5th  . TONSILLECTOMY      There were no vitals filed for this visit.    Subjective Assessment - 02/20/21 0931    Subjective Pt. indicated complaints of worsened symptoms in Sept/Oct 2021 of previously noted complaints.  Pt. stated worse in evening.  Pt. stated complaints from Rt posterior/lateral hip to  thigh with sometimes down to lower leg.  Pt. stated difficulty sleeping, transfers, standing/walking prolonged.  Pt. stated 2 injections with most recent helping leg symptoms for about 1 week then returned.  Denied bowel or bladder changes.    Pertinent History osteopenia, GERD, depression, ADHD.  Pt. gained about 30 lbs or so in last year.    Limitations Standing;Walking;Sitting    How long can you walk comfortably? limited sometimes from car to house (household distance)    Patient Stated Goals Reduce pain, sleep better    Currently in Pain? Yes    Pain Score 6    pain at worst 9/10   Pain Location Back    Pain Orientation Right    Pain Descriptors / Indicators Tightness;Burning;Constant    Pain Type Chronic pain    Pain Radiating Towards Rt hip, Rt LE    Pain Onset More than a month ago    Pain Frequency Constant    Aggravating Factors  prolonged sitting, standing, walking, sleep difficulty    Pain Relieving Factors injection helped for week,    Effect of Pain on Daily Activities Unable to walk, community activity limited, difficulty sitting prolonged (cars, household, etc)  [facility] PT Assessment - 02/20/21 0001      Assessment   Medical Diagnosis Chronic Rt sided low back pain, rt sciatica    Referring Provider (PT) Dr. Durward Fortes    Onset Date/Surgical Date 09/19/20    Hand Dominance Right    Prior Therapy Neck, knees      Precautions   Precautions None      Restrictions   Weight Bearing Restrictions No      Balance Screen   Has the patient fallen in the past 6 months No    Has the patient had a decrease in activity level because of a fear of falling?  No    Is the patient reluctant to leave their home because of a fear of falling?  No      Home Environment   Living Environment Private residence    Type of Home House    Additional Comments full flight of stairs to get to [place] with rails, 2 stairs to enter      Prior Function   Level of [place] Retired    Leisure desires to walk for exercise      Cognition   Overall Cognitive Status Within Functional Limits for tasks assessed      Observation/Other Assessments   Focus on Therapeutic Outcomes (FOTO)  intake 42%, outcome expected 55%      Sensation   Light Touch Appears Intact      Functional Tests   Functional tests Sit to Stand      Sit to Stand   Comments painful, increased difficulty, performed c no UE      Posture/Postural Control   Posture Comments Mild Lt lateral trunk shift on hips in stance, moderate noted in sitting      ROM / Strength   AROM / PROM / Strength AROM;PROM;Strength      AROM   AROM Assessment Site Lumbar;Hip    Right/Left Hip Left;Right    Lumbar Flexion movement to ankle, pulling in hamstring Rt, more behind the knee complaints after movement    Lumbar Extension 50% Rt buttock pain, REIS x 5 produced increased pain in Rt buttock/posterior thigh.  Prone press up on elbows x 5 produced similar complaints.      PROM   Overall PROM Comments WFL Rt hip ER/IR in supine 90 deg flexion assessment with not concordant symptoms    PROM Assessment Site Hip    Right/Left Hip Left;Right      Strength   Strength Assessment Site Hip;Ankle;Knee    Right/Left Hip Right;Left    Right Hip Flexion 4/5    Right Hip Extension 3+/5    Left Hip Flexion 5/5    Right/Left Knee Right;Left    Right Knee Flexion 4/5   pain posterior thigh/knee   Right Knee Extension 4/5    Left Knee Flexion 5/5    Left Knee Extension 5/5    Right/Left Ankle Right;Left    Right Ankle Dorsiflexion 5/5    Left Ankle Dorsiflexion 5/5      Flexibility   Soft Tissue Assessment /Muscle Length yes    Hamstrings Concordant posterior Rt hip/thigh complaints c Rt passive SLR to 70 deg.  Lt to 80 deg no complaints      Palpation   Spinal mobility Concordant radicular symptoms c PAIVM L4, L5 assessment.  Mild restriction L2-L5 cPA    Palpation comment Tenderness  to touch Rt glute max, piriformis, proximal and middle 1/3 Rt  hamstrings.      Special Tests   Other special tests (+) slump on Rt c full knee extension,DF and cervical flexion.  symptoms reduced c cervical extension/ankle PF.  (-) Crossed SLR noted bilateral      Ambulation/Gait   Ambulation/Gait Yes    Ambulation/Gait Assistance 7: Independent    Gait Pattern Antalgic;Lateral trunk lean to left;Decreased stance time - right                      Objective measurements completed on examination: See above findings.       [facility] Adult PT Treatment/Exercise - 02/20/21 0001      Exercises   Exercises Lumbar;Other Exercises    Other Exercises  HEP instruction/performance c cues for techniques, trial set of each exercise performed.      Lumbar Exercises: Stretches   Lower Trunk Rotation --   15 sec x 3 bilateral   Other Lumbar Stretch Exercise Piriformis stretch figure 4 15 sec x 3 Rt LE      Lumbar Exercises: Seated   Other Seated Lumbar Exercises seated sciatic nerve flossing (knee extension c PF, knee flexion c DF) 2 x 10      Lumbar Exercises: Supine   Other Supine Lumbar Exercises supine sciatic nerve flossing (knee extension c PF, knee flexion c DF) 2 x 10      Manual Therapy   Manual therapy comments Lateral shift correction manually (hip movement to Lt)                  PT Education - 02/20/21 2353    Education Details HEP, POC    Person(s) Educated Patient    Methods Explanation;Demonstration;Handout;Verbal cues    Comprehension Returned demonstration;Verbalized understanding            PT Short Term Goals - 02/20/21 0924      PT SHORT TERM GOAL #1   Title Patient will demonstrate independent use of home exercise program to maintain progress from in clinic treatments.    Time 3    Period Weeks    Status New    Target Date 03/13/21             PT Long Term Goals - 02/20/21 0924      PT LONG TERM GOAL #1   Title Patient will  demonstrate/report pain at worst less than or equal to 2/10 to facilitate minimal limitation in daily activity secondary to pain symptoms.    Time 8    Period Weeks    Status New    Target Date 04/17/21      PT LONG TERM GOAL #2   Title Patient will demonstrate independent use of home exercise program to facilitate ability to maintain/progress functional gains from skilled physical therapy services.    Time 8    Period Weeks    Status New    Target Date 04/17/21      PT LONG TERM GOAL #3   Title Patient will demonstrate lumbar extension 100 % WFL s symptoms to facilitate upright standing, walking posture at PLOF s limitation.    Time 8    Period Weeks    Status New    Target Date 04/17/21      PT LONG TERM GOAL #4   Title Pt. will demonstrate FOTO outcome > = 55 to indicated reduced disability due to condition.    Time 8    Period Weeks    Status New  Target Date 04/17/21      PT LONG TERM GOAL #5   Title Pt. will demonstrate Rt LE MMT 5/5 throughout to facilitate usual standing, walking, squat/transfers at PLOF s limitation    Time 8    Period Weeks    Status New    Target Date 04/17/21      Additional Long Term Goals   Additional Long Term Goals Yes      PT LONG TERM GOAL #6   Title Pt. will demonstrate equal passive SLR, slump testing Rt to Lt without symptoms to facilitate normal mobility at PLOF.    Time 8    Period Weeks    Status New    Target Date 04/17/21                  Plan - 02/20/21 0925    Clinical Impression Statement Patient is a 65 y.o. female who comes to clinic with complaints of low back pain, Rt leg pain with evidence of lateral shift, mobility/strength deficits that impair their ability to perform usual daily and recreational functional activities without increase difficulty/symptoms at this time.  Patient to benefit from skilled PT services to address impairments and limitations to improve to previous level of function without  restriction secondary to condition.    Personal Factors and Comorbidities Comorbidity 3+;Time since onset of injury/illness/exacerbation    Comorbidities osteopenia, GERD, depression, ADHD    Examination-Activity Limitations Sit;Sleep;Squat;Bend;Caring for Others;Stairs;Carry;Stand;Dressing;Hygiene/Grooming;Lift;Locomotion Level;Transfers    Examination-Participation Restrictions Community Activity;Shop;Driving;Laundry;Cleaning    Stability/Clinical Decision Making Evolving/Moderate complexity    Clinical Decision Making Moderate    Rehab Potential --   fair to good   PT Frequency --   1-2/week   PT Duration 8 weeks    PT Treatment/Interventions ADLs/Self Care Home Management;Cryotherapy;Electrical Stimulation;Iontophoresis 4mg /ml Dexamethasone;Moist Heat;Traction;Balance training;Therapeutic exercise;Therapeutic activities;Functional mobility training;Stair training;Gait training;DME Instruction;Ultrasound;Neuromuscular re-education;Patient/family education;Manual techniques;Taping;Passive range of motion;Dry needling;Joint Manipulations;Spinal Manipulations    PT Next Visit Plan Review HEP,  recheck lateral shift correction, direction specific movmeent extension response    PT Home Exercise Plan [email or web address]    Consulted and Agree with Plan of Care Patient           Patient will benefit from skilled therapeutic intervention in order to improve the following deficits and impairments:  Abnormal gait,Decreased endurance,Hypomobility,Increased edema,Decreased activity tolerance,Decreased strength,Increased fascial restricitons,Pain,Increased muscle spasms,Difficulty walking,Decreased mobility,Decreased balance,Decreased range of motion,Impaired perceived functional ability,Improper body mechanics,Postural dysfunction,Impaired flexibility,Decreased coordination  Visit Diagnosis: Chronic right-sided low back pain with right-sided sciatica  Pain in right leg  Muscle weakness  (generalized)  Difficulty in walking, not elsewhere classified  Abnormal posture     Problem List Patient Active Problem List   Diagnosis Date Noted  . Tremors of nervous system 02/18/2021  . Primary hypertension 02/18/2021  . Morbid obesity ([place]) 02/18/2021  . Depression with anxiety 02/18/2021  . Low back pain 11/10/2020  . Unilateral primary osteoarthritis, left knee 01/09/2019  . Left knee pain 01/09/2019  . TMJ arthropathy 11/08/2016  . Complex sleep apnea syndrome 04/15/2015  . ANEMIA-NOS 05/16/2009  . Attention deficit hyperactivity disorder (ADHD) 05/16/2009  . ALLERGIC RHINITIS 05/16/2009  . URINARY INCONTINENCE 05/16/2009  . NEPHROLITHIASIS, HX OF 05/16/2009    Scot Jun, PT, DPT, OCS, ATC 02/20/21  10:33 AM    [facility] Physical Therapy [address] [place], [place], [address] Phone: [phone]   Fax:  [phone]  Name: Brandi Parker MRN: [id number] Date of Birth: [birth year]

## 2021-02-20 NOTE — Patient Instructions (Signed)
Access Code: [email or web address] URL: [email or web address] Date: 02/20/2021 Prepared by: Scot Jun  Exercises Supine Lower Trunk Rotation - 2 x daily - 7 x weekly - 1 sets - 5 reps - 15 hold Supine Figure 4 Piriformis Stretch - 2 x daily - 7 x weekly - 3 sets - 10 reps Supine 90/90 Sciatic Nerve Glide with Knee Flexion/Extension - 2 x daily - 7 x weekly - 3 sets - 10 reps

## 2021-02-23 ENCOUNTER — Other Ambulatory Visit: Payer: Self-pay

## 2021-02-23 ENCOUNTER — Ambulatory Visit: Payer: BC Managed Care – PPO | Admitting: Physical Therapy

## 2021-02-23 ENCOUNTER — Encounter: Payer: Self-pay | Admitting: Physical Therapy

## 2021-02-23 DIAGNOSIS — M5441 Lumbago with sciatica, right side: Secondary | ICD-10-CM | POA: Diagnosis not present

## 2021-02-23 DIAGNOSIS — G8929 Other chronic pain: Secondary | ICD-10-CM

## 2021-02-23 DIAGNOSIS — R262 Difficulty in walking, not elsewhere classified: Secondary | ICD-10-CM | POA: Diagnosis not present

## 2021-02-23 DIAGNOSIS — M79604 Pain in right leg: Secondary | ICD-10-CM

## 2021-02-23 DIAGNOSIS — M6281 Muscle weakness (generalized): Secondary | ICD-10-CM

## 2021-02-23 DIAGNOSIS — R293 Abnormal posture: Secondary | ICD-10-CM

## 2021-02-23 NOTE — Therapy (Signed)
[facility] Physical Therapy [address] [place], [place], [address] Phone: [phone]   Fax:  [phone]  Physical Therapy Treatment  Patient Details  Name: Brandi Parker MRN: [id number] Date of Birth: [birth year] Referring Provider (PT): Dr. Durward Fortes   Encounter Date: 02/23/2021   PT End of Session - 02/23/21 1244    Visit Number 2    Number of Visits 12    Date for PT Re-Evaluation 04/17/21    Progress Note Due on Visit 10    PT Start Time 6712    PT Stop Time 1223    PT Time Calculation (min) 38 min    Activity Tolerance Patient limited by pain    Behavior During Therapy [facility] for tasks assessed/performed           Past Medical History:  Diagnosis Date  . ADHD (attention deficit hyperactivity disorder)   . Allergy   . Anemia   . Arthritis   . Brain injury ([place]) 1981   fell off a desk chair  . Chicken pox   . Depression   . Fracture, ankle   . Fracture, toe   . GERD (gastroesophageal reflux disease)   . Menopausal syndrome   . Migraines   . Nephrolithiasis   . Osteopenia 01/24/09   dexa scan  . Sleep apnea    uses cpap 08/2020  . Sleep apnea syndrome    could not tolerate CPAP  . Urinary incontinence     Past Surgical History:  Procedure Laterality Date  . CATARACT EXTRACTION, BILATERAL  04/2020  . COLONOSCOPY  10/14/09   Dr. Silvano Rusk, result clear repeat in 10 years  . DILATION AND CURETTAGE OF UTERUS    . KNEE ARTHROSCOPY  12-16-11   left knee, per Dr. Durward Fortes   . KNEE ARTHROSCOPY Right 02/22/2018   per Dr. Dorna Leitz , 2 Left arthroscopy-2015&2016  . REPAIR NONUNION / MALUNION METATARSAL / TARSAL BONES     right 5th  . TONSILLECTOMY      There were no vitals filed for this visit.   Subjective Assessment - 02/23/21 1201    Subjective Pt reporting compliance with her HEP and reported she liked having the videos to help with technique. Pt still reporting pain of 3-4/10 in R hip. Pt reporting her pain Saturday was  worse and required sitting with regular ADL's.    Pertinent History osteopenia, GERD, depression, ADHD.  Pt. gained about 30 lbs or so in last year.    Limitations Standing;Walking;Sitting    How long can you walk comfortably? limited sometimes from car to house (household distance)    Patient Stated Goals Reduce pain, sleep better    Currently in Pain? Yes    Pain Score 4     Pain Location Back    Pain Orientation Lower;Right    Pain Descriptors / Indicators Aching;Burning;Tightness    Pain Type Chronic pain    Pain Radiating Towards R buttock and LE    Pain Onset More than a month ago                             [facility] Adult PT Treatment/Exercise - 02/23/21 0001      Posture/Postural Control   Posture Comments very mild shift noted in standing today during visit and mild shift noted in sitting      Exercises   Exercises Lumbar;Other Exercises      Lumbar Exercises: Stretches  Active Hamstring Stretch Right;Left;30 seconds    Single Knee to Chest Stretch 3 reps;10 seconds   bilaeral sides   Lower Trunk Rotation 3 reps;20 seconds;Limitations   bilateral   Other Lumbar Stretch Exercise Piriformis stretch figure 4 15 sec x 3 Rt LE      Lumbar Exercises: Standing   Other Standing Lumbar Exercises trunk extension with elbows against wall (pt with increased radiculopathy pains down R LE)      Lumbar Exercises: Seated   Other Seated Lumbar Exercises seated sciatic nerve flossing (knee extension c PF, knee flexion c DF) 2 x 10      Lumbar Exercises: Supine   Other Supine Lumbar Exercises supine sciatic nerve flossing (knee extension c PF, knee flexion c DF) 2 x 10      Manual Therapy   Manual therapy comments Lateral shift correction manually (hip movement to Lt), soft tissue mobs to right piriformis, pt instructed in using a tennis ball for home self soft tissue mobs.                  PT Education - 02/23/21 1216    Education Details exerises  technique, HEP review    Person(s) Educated Patient    Methods Explanation;Demonstration;Verbal cues    Comprehension Verbalized understanding;Returned demonstration            PT Short Term Goals - 02/23/21 1244      PT SHORT TERM GOAL #1   Title Patient will demonstrate independent use of home exercise program to maintain progress from in clinic treatments.    Status On-going             PT Long Term Goals - 02/20/21 2951      PT LONG TERM GOAL #1   Title Patient will demonstrate/report pain at worst less than or equal to 2/10 to facilitate minimal limitation in daily activity secondary to pain symptoms.    Time 8    Period Weeks    Status New    Target Date 04/17/21      PT LONG TERM GOAL #2   Title Patient will demonstrate independent use of home exercise program to facilitate ability to maintain/progress functional gains from skilled physical therapy services.    Time 8    Period Weeks    Status New    Target Date 04/17/21      PT LONG TERM GOAL #3   Title Patient will demonstrate lumbar extension 100 % WFL s symptoms to facilitate upright standing, walking posture at PLOF s limitation.    Time 8    Period Weeks    Status New    Target Date 04/17/21      PT LONG TERM GOAL #4   Title Pt. will demonstrate FOTO outcome > = 55 to indicated reduced disability due to condition.    Time 8    Period Weeks    Status New    Target Date 04/17/21      PT LONG TERM GOAL #5   Title Pt. will demonstrate Rt LE MMT 5/5 throughout to facilitate usual standing, walking, squat/transfers at PLOF s limitation    Time 8    Period Weeks    Status New    Target Date 04/17/21      Additional Long Term Goals   Additional Long Term Goals Yes      PT LONG TERM GOAL #6   Title Pt. will demonstrate equal passive SLR, slump testing Rt to  Lt without symptoms to facilitate normal mobility at PLOF.    Time 8    Period Weeks    Status New    Target Date 04/17/21                  Plan - 02/23/21 1222    Clinical Impression Statement Pt arriving today reporting 4/10 pain in her right sided low back. Pt tolerating lateral shift with no evidence of radiating pain during flexion based activities. Nerve flossing performed in supine along with LE stretching. Pt with radicular pains radiating down R LE with extension based activiites. Continue with skilled PT.    Personal Factors and Comorbidities Comorbidity 3+;Time since onset of injury/illness/exacerbation    Comorbidities osteopenia, GERD, depression, ADHD    Examination-Activity Limitations Sit;Sleep;Squat;Bend;Caring for Others;Stairs;Carry;Stand;Dressing;Hygiene/Grooming;Lift;Locomotion Level;Transfers    Examination-Participation Restrictions Community Activity;Shop;Driving;Laundry;Cleaning    Stability/Clinical Decision Making Evolving/Moderate complexity    PT Treatment/Interventions ADLs/Self Care Home Management;Cryotherapy;Electrical Stimulation;Iontophoresis 4mg /ml Dexamethasone;Moist Heat;Traction;Balance training;Therapeutic exercise;Therapeutic activities;Functional mobility training;Stair training;Gait training;DME Instruction;Ultrasound;Neuromuscular re-education;Patient/family education;Manual techniques;Taping;Passive range of motion;Dry needling;Joint Manipulations;Spinal Manipulations    PT Next Visit Plan Review HEP,  recheck lateral shift correction, direction specific movmeent extension response    PT Home Exercise Plan [email or web address]    Consulted and Agree with Plan of Care Patient           Patient will benefit from skilled therapeutic intervention in order to improve the following deficits and impairments:  Abnormal gait,Decreased endurance,Hypomobility,Increased edema,Decreased activity tolerance,Decreased strength,Increased fascial restricitons,Pain,Increased muscle spasms,Difficulty walking,Decreased mobility,Decreased balance,Decreased range of motion,Impaired perceived functional  ability,Improper body mechanics,Postural dysfunction,Impaired flexibility,Decreased coordination  Visit Diagnosis: Chronic right-sided low back pain with right-sided sciatica  Pain in right leg  Difficulty in walking, not elsewhere classified  Muscle weakness (generalized)  Abnormal posture     Problem List Patient Active Problem List   Diagnosis Date Noted  . Tremors of nervous system 02/18/2021  . Primary hypertension 02/18/2021  . Morbid obesity ([place]) 02/18/2021  . Depression with anxiety 02/18/2021  . Low back pain 11/10/2020  . Unilateral primary osteoarthritis, left knee 01/09/2019  . Left knee pain 01/09/2019  . TMJ arthropathy 11/08/2016  . Complex sleep apnea syndrome 04/15/2015  . ANEMIA-NOS 05/16/2009  . Attention deficit hyperactivity disorder (ADHD) 05/16/2009  . ALLERGIC RHINITIS 05/16/2009  . URINARY INCONTINENCE 05/16/2009  . NEPHROLITHIASIS, HX OF 05/16/2009    Oretha Caprice, PT, MPT 02/23/2021, 12:46 PM  [facility] Physical Therapy [address] [place], [place], [address] Phone: [phone]   Fax:  [phone]  Name: Brandi Parker MRN: [id number] Date of Birth: 14-Jul-1956

## 2021-03-02 ENCOUNTER — Encounter: Payer: Self-pay | Admitting: Physical Therapy

## 2021-03-02 ENCOUNTER — Other Ambulatory Visit: Payer: Self-pay

## 2021-03-02 ENCOUNTER — Ambulatory Visit: Payer: BC Managed Care – PPO | Admitting: Physical Therapy

## 2021-03-02 DIAGNOSIS — M79604 Pain in right leg: Secondary | ICD-10-CM | POA: Diagnosis not present

## 2021-03-02 DIAGNOSIS — R262 Difficulty in walking, not elsewhere classified: Secondary | ICD-10-CM

## 2021-03-02 DIAGNOSIS — M5441 Lumbago with sciatica, right side: Secondary | ICD-10-CM | POA: Diagnosis not present

## 2021-03-02 DIAGNOSIS — G8929 Other chronic pain: Secondary | ICD-10-CM

## 2021-03-02 DIAGNOSIS — M6281 Muscle weakness (generalized): Secondary | ICD-10-CM

## 2021-03-02 DIAGNOSIS — R293 Abnormal posture: Secondary | ICD-10-CM

## 2021-03-02 NOTE — Therapy (Signed)
[facility] Physical Therapy [address] [place], [place], [address] Phone: [phone]   Fax:  [phone]  Physical Therapy Treatment  Patient Details  Name: Brandi Parker MRN: [id number] Date of Birth: [birth year] Referring Provider (PT): Dr. Durward Fortes   Encounter Date: 03/02/2021   PT End of Session - 03/02/21 0821    Visit Number 3    Number of Visits 12    Date for PT Re-Evaluation 04/17/21    Progress Note Due on Visit 10    PT Start Time 0804    PT Stop Time 0843    PT Time Calculation (min) 39 min    Activity Tolerance Patient limited by pain    Behavior During Therapy Brandi Parker for tasks assessed/performed           Past Medical History:  Diagnosis Date  . ADHD (attention deficit hyperactivity disorder)   . Allergy   . Anemia   . Arthritis   . Brain injury ([place]) 1981   fell off a desk chair  . Chicken pox   . Depression   . Fracture, ankle   . Fracture, toe   . GERD (gastroesophageal reflux disease)   . Menopausal syndrome   . Migraines   . Nephrolithiasis   . Osteopenia 01/24/09   dexa scan  . Sleep apnea    uses cpap 08/2020  . Sleep apnea syndrome    could not tolerate CPAP  . Urinary incontinence     Past Surgical History:  Procedure Laterality Date  . CATARACT EXTRACTION, BILATERAL  04/2020  . COLONOSCOPY  10/14/09   Dr. Silvano Rusk, result clear repeat in 10 years  . DILATION AND CURETTAGE OF UTERUS    . KNEE ARTHROSCOPY  12-16-11   left knee, per Dr. Durward Fortes   . KNEE ARTHROSCOPY Right 02/22/2018   per Dr. Dorna Leitz , 2 Left arthroscopy-2015&2016  . REPAIR NONUNION / MALUNION METATARSAL / TARSAL BONES     right 5th  . TONSILLECTOMY      There were no vitals filed for this visit.   Subjective Assessment - 03/02/21 0818    Subjective Brandi Parker reporting using her tennis ball this weekend. Pt reporting 3/10 pain in R hip/low back. Pt reporting having to modify her ADL's to sitting instead of standing.    Pertinent  History osteopenia, GERD, depression, ADHD.  Pt. gained about 30 lbs or so in last year.    Limitations Standing;Walking;Sitting    How long can you walk comfortably? limited sometimes from car to house (household distance)    Patient Stated Goals Reduce pain, sleep better    Pain Score 3     Pain Location Back    Pain Orientation Right;Left    Pain Descriptors / Indicators Aching;Tightness    Pain Type Chronic pain    Pain Onset More than a month ago    Pain Frequency Constant                             [facility] Adult PT Treatment/Exercise - 03/02/21 0001      Lumbar Exercises: Stretches   Active Hamstring Stretch Right;Left;30 seconds    Active Hamstring Stretch Limitations seated    Single Knee to Chest Stretch 3 reps;10 seconds   bilaeral sides   Lower Trunk Rotation 3 reps;20 seconds;Limitations   bilateral   Other Lumbar Stretch Exercise Piriformis stretch figure 4 15 sec x 3 Rt LE  Lumbar Exercises: Standing   Other Standing Lumbar Exercises trunk extension with elbows against wall (pt with increased radiculopathy pains down R LE)      Lumbar Exercises: Seated   Other Seated Lumbar Exercises seated sciatic nerve flossing (knee extension c PF, knee flexion c DF) 2 x 10      Manual Therapy   Manual therapy comments Lateral shift correction manually (hip movement to Lt), soft tissue mobs to right piriformis, pt instructed in using a tennis ball for Parker self soft tissue mobs.                  PT Education - 03/02/21 0820    Education Details Modified pt's trunk rotation to figure 4 trunk rotation    Person(s) Educated Patient    Methods Explanation;Demonstration;Verbal cues;Tactile cues    Comprehension Verbalized understanding;Returned demonstration            PT Short Term Goals - 03/02/21 0834      PT SHORT TERM GOAL #1   Title Patient will demonstrate independent use of Parker exercise program to maintain progress from in clinic  treatments.    Status On-going             PT Long Term Goals - 03/02/21 0834      PT LONG TERM GOAL #1   Title Patient will demonstrate/report pain at worst less than or equal to 2/10 to facilitate minimal limitation in daily activity secondary to pain symptoms.    Status On-going      PT LONG TERM GOAL #2   Title Patient will demonstrate independent use of Parker exercise program to facilitate ability to maintain/progress functional gains from skilled physical therapy services.    Status On-going      PT LONG TERM GOAL #3   Title Patient will demonstrate lumbar extension 100 % WFL s symptoms to facilitate upright standing, walking posture at PLOF s limitation.    Status On-going      PT LONG TERM GOAL #4   Title Pt. will demonstrate FOTO outcome > = 55 to indicated reduced disability due to condition.    Status On-going      PT LONG TERM GOAL #5   Title Pt. will demonstrate Rt LE MMT 5/5 throughout to facilitate usual standing, walking, squat/transfers at PLOF s limitation    Status On-going      PT LONG TERM GOAL #6   Title Pt. will demonstrate equal passive SLR, slump testing Rt to Lt without symptoms to facilitate normal mobility at PLOF.    Status On-going                 Plan - 03/02/21 7001    Clinical Impression Statement Pt arriving to therapy today reporting 3/10 pain in R hip/low back. Pt tolerating exercises well. Pt reporting pain with bilateral trunk rotation and Parker exercise were modified to figure 4 trunk rotation. Pt still reporting standing is more painful than sitting or walking. Percussion to R hip musculature.  Continue skilled PT.    Personal Factors and Comorbidities Comorbidity 3+;Time since onset of injury/illness/exacerbation    Comorbidities osteopenia, GERD, depression, ADHD    Examination-Activity Limitations Sit;Sleep;Squat;Bend;Caring for Others;Stairs;Carry;Stand;Dressing;Hygiene/Grooming;Lift;Locomotion Level;Transfers     Examination-Participation Restrictions Community Activity;Shop;Driving;Laundry;Cleaning    Stability/Clinical Decision Making Evolving/Moderate complexity    PT Duration 8 weeks    PT Treatment/Interventions ADLs/Self Care Parker Management;Cryotherapy;Electrical Stimulation;Iontophoresis 4mg /ml Dexamethasone;Moist Heat;Traction;Balance training;Therapeutic exercise;Therapeutic activities;Functional mobility training;Stair training;Gait training;DME Instruction;Ultrasound;Neuromuscular re-education;Patient/family education;Manual techniques;Taping;Passive range  of motion;Dry needling;Joint Manipulations;Spinal Manipulations    PT Next Visit Plan Review HEP,  recheck lateral shift correction, direction specific movmeent extension response    PT Parker Exercise Plan [id number]    Consulted and Agree with Plan of Care Patient           Patient will benefit from skilled therapeutic intervention in order to improve the following deficits and impairments:  Abnormal gait,Decreased endurance,Hypomobility,Increased edema,Decreased activity tolerance,Decreased strength,Increased fascial restricitons,Pain,Increased muscle spasms,Difficulty walking,Decreased mobility,Decreased balance,Decreased range of motion,Impaired perceived functional ability,Improper body mechanics,Postural dysfunction,Impaired flexibility,Decreased coordination  Visit Diagnosis: Chronic right-sided low back pain with right-sided sciatica  Pain in right leg  Difficulty in walking, not elsewhere classified  Muscle weakness (generalized)  Abnormal posture     Problem List Patient Active Problem List   Diagnosis Date Noted  . Tremors of nervous system 02/18/2021  . Primary hypertension 02/18/2021  . Morbid obesity ([place]) 02/18/2021  . Depression with anxiety 02/18/2021  . Low back pain 11/10/2020  . Unilateral primary osteoarthritis, left knee 01/09/2019  . Left knee pain 01/09/2019  . TMJ arthropathy 11/08/2016  . Complex  sleep apnea syndrome 04/15/2015  . ANEMIA-NOS 05/16/2009  . Attention deficit hyperactivity disorder (ADHD) 05/16/2009  . ALLERGIC RHINITIS 05/16/2009  . URINARY INCONTINENCE 05/16/2009  . NEPHROLITHIASIS, HX OF 05/16/2009    Oretha Caprice, PT, MPT 03/02/2021, 9:04 AM  [facility] Physical Therapy [address] [place], [place], [address] Phone: [phone]   Fax:  [phone]  Name: YASHIKA MASK MRN: [id number] Date of Birth: 08/13/1956

## 2021-03-06 ENCOUNTER — Ambulatory Visit (INDEPENDENT_AMBULATORY_CARE_PROVIDER_SITE_OTHER): Payer: BC Managed Care – PPO | Admitting: Rehabilitative and Restorative Service Providers"

## 2021-03-06 ENCOUNTER — Other Ambulatory Visit: Payer: Self-pay

## 2021-03-06 ENCOUNTER — Encounter: Payer: Self-pay | Admitting: Rehabilitative and Restorative Service Providers"

## 2021-03-06 DIAGNOSIS — M5441 Lumbago with sciatica, right side: Secondary | ICD-10-CM

## 2021-03-06 DIAGNOSIS — M79604 Pain in right leg: Secondary | ICD-10-CM | POA: Diagnosis not present

## 2021-03-06 DIAGNOSIS — R262 Difficulty in walking, not elsewhere classified: Secondary | ICD-10-CM | POA: Diagnosis not present

## 2021-03-06 DIAGNOSIS — G8929 Other chronic pain: Secondary | ICD-10-CM

## 2021-03-06 DIAGNOSIS — M6281 Muscle weakness (generalized): Secondary | ICD-10-CM | POA: Diagnosis not present

## 2021-03-06 DIAGNOSIS — R293 Abnormal posture: Secondary | ICD-10-CM

## 2021-03-06 NOTE — Therapy (Signed)
[facility] Physical Therapy [address] [place], [place], [address] Phone: [phone]   Fax:  [phone]  Physical Therapy Treatment  Patient Details  Name: Brandi Parker MRN: [id number] Date of Birth: [birth year] Referring Provider (PT): Dr. Durward Fortes   Encounter Date: 03/06/2021   PT End of Session - 03/06/21 0828    Visit Number 4    Number of Visits 12    Date for PT Re-Evaluation 04/17/21    Progress Note Due on Visit 10    PT Start Time 0805    PT Stop Time 0843    PT Time Calculation (min) 38 min    Activity Tolerance Patient tolerated treatment well    Behavior During Therapy [facility] for tasks assessed/performed           Past Medical History:  Diagnosis Date  . ADHD (attention deficit hyperactivity disorder)   . Allergy   . Anemia   . Arthritis   . Brain injury ([place]) 1981   fell off a desk chair  . Chicken pox   . Depression   . Fracture, ankle   . Fracture, toe   . GERD (gastroesophageal reflux disease)   . Menopausal syndrome   . Migraines   . Nephrolithiasis   . Osteopenia 01/24/09   dexa scan  . Sleep apnea    uses cpap 08/2020  . Sleep apnea syndrome    could not tolerate CPAP  . Urinary incontinence     Past Surgical History:  Procedure Laterality Date  . CATARACT EXTRACTION, BILATERAL  04/2020  . COLONOSCOPY  10/14/09   Dr. Silvano Rusk, result clear repeat in 10 years  . DILATION AND CURETTAGE OF UTERUS    . KNEE ARTHROSCOPY  12-16-11   left knee, per Dr. Durward Fortes   . KNEE ARTHROSCOPY Right 02/22/2018   per Dr. Dorna Leitz , 2 Left arthroscopy-2015&2016  . REPAIR NONUNION / MALUNION METATARSAL / TARSAL BONES     right 5th  . TONSILLECTOMY      There were no vitals filed for this visit.   Subjective Assessment - 03/06/21 0820    Subjective Pt. indicated prolonged standing giving posterior thigh symptoms up to 7/10.    Pertinent History osteopenia, GERD, depression, ADHD.  Pt. gained about 30 lbs or so in last  year.    Limitations Standing;Walking;Sitting    How long can you walk comfortably? limited sometimes from car to house (household distance)    Patient Stated Goals Reduce pain, sleep better    Pain Score 3     Pain Location Leg    Pain Orientation Right;Upper;Posterior    Pain Descriptors / Indicators Aching;Tightness    Pain Type Chronic pain    Pain Onset More than a month ago    Pain Frequency Intermittent    Aggravating Factors  standing, lifting    Pain Relieving Factors treatmen has helped                             [facility] Adult PT Treatment/Exercise - 03/06/21 0001      Lumbar Exercises: Stretches   Other Lumbar Stretch Exercise Piriformis stretch figure 4 15 sec x 3 Rt LE      Lumbar Exercises: Standing   Other Standing Lumbar Exercises lumbar extension x 10 (assessment)      Lumbar Exercises: Supine   Other Supine Lumbar Exercises supine sciatic nerve flossing (knee extension c PF, knee flexion c DF)  2 x 10    Other Supine Lumbar Exercises supine hamstring stretch c contralateral SLR 2 x 10 bilateral      Manual Therapy   Manual therapy comments percussive device Rt proximal/middle 1/3 hamstring, piriformis and glute max                  PT Education - 03/06/21 0839    Education Details DN education    Person(s) Educated Patient    Methods Explanation;Demonstration;Handout    Comprehension Verbalized understanding            PT Short Term Goals - 03/02/21 0834      PT SHORT TERM GOAL #1   Title Patient will demonstrate independent use of home exercise program to maintain progress from in clinic treatments.    Status On-going             PT Long Term Goals - 03/02/21 0834      PT LONG TERM GOAL #1   Title Patient will demonstrate/report pain at worst less than or equal to 2/10 to facilitate minimal limitation in daily activity secondary to pain symptoms.    Status On-going      PT LONG TERM GOAL #2   Title Patient will  demonstrate independent use of home exercise program to facilitate ability to maintain/progress functional gains from skilled physical therapy services.    Status On-going      PT LONG TERM GOAL #3   Title Patient will demonstrate lumbar extension 100 % WFL s symptoms to facilitate upright standing, walking posture at PLOF s limitation.    Status On-going      PT LONG TERM GOAL #4   Title Pt. will demonstrate FOTO outcome > = 55 to indicated reduced disability due to condition.    Status On-going      PT LONG TERM GOAL #5   Title Pt. will demonstrate Rt LE MMT 5/5 throughout to facilitate usual standing, walking, squat/transfers at PLOF s limitation    Status On-going      PT LONG TERM GOAL #6   Title Pt. will demonstrate equal passive SLR, slump testing Rt to Lt without symptoms to facilitate normal mobility at PLOF.    Status On-going                 Plan - 03/06/21 1941    Clinical Impression Statement Description of symptoms and analysis in movement indicated combination of lumbar movement deficits as localized hamstring, glute/piriformis tightness.  Support for localized myofascial restriction due to tenderness to touch as well as benefits noted from localized soft tissue mobilization in clinic and at home. Discussed dry needling education today.    Personal Factors and Comorbidities Comorbidity 3+;Time since onset of injury/illness/exacerbation    Comorbidities osteopenia, GERD, depression, ADHD    Examination-Activity Limitations Sit;Sleep;Squat;Bend;Caring for Others;Stairs;Carry;Stand;Dressing;Hygiene/Grooming;Lift;Locomotion Level;Transfers    Examination-Participation Restrictions Community Activity;Shop;Driving;Laundry;Cleaning    Stability/Clinical Decision Making Evolving/Moderate complexity    PT Duration 8 weeks    PT Treatment/Interventions ADLs/Self Care Home Management;Cryotherapy;Electrical Stimulation;Iontophoresis 4mg /ml Dexamethasone;Moist  Heat;Traction;Balance training;Therapeutic exercise;Therapeutic activities;Functional mobility training;Stair training;Gait training;DME Instruction;Ultrasound;Neuromuscular re-education;Patient/family education;Manual techniques;Taping;Passive range of motion;Dry needling;Joint Manipulations;Spinal Manipulations    PT Next Visit Plan Possible DN/myofascial release techniques to glute, piriformis, hamstring.  Continued strenghtening core/hips at this time.    PT [place] and Agree with Plan of Care Patient           Patient will benefit from skilled therapeutic intervention  in order to improve the following deficits and impairments:  Abnormal gait,Decreased endurance,Hypomobility,Increased edema,Decreased activity tolerance,Decreased strength,Increased fascial restricitons,Pain,Increased muscle spasms,Difficulty walking,Decreased mobility,Decreased balance,Decreased range of motion,Impaired perceived functional ability,Improper body mechanics,Postural dysfunction,Impaired flexibility,Decreased coordination  Visit Diagnosis: Chronic right-sided low back pain with right-sided sciatica  Pain in right leg  Difficulty in walking, not elsewhere classified  Muscle weakness (generalized)  Abnormal posture     Problem List Patient Active Problem List   Diagnosis Date Noted  . Tremors of nervous system 02/18/2021  . Primary hypertension 02/18/2021  . Morbid obesity ([place]) 02/18/2021  . Depression with anxiety 02/18/2021  . Low back pain 11/10/2020  . Unilateral primary osteoarthritis, left knee 01/09/2019  . Left knee pain 01/09/2019  . TMJ arthropathy 11/08/2016  . Complex sleep apnea syndrome 04/15/2015  . ANEMIA-NOS 05/16/2009  . Attention deficit hyperactivity disorder (ADHD) 05/16/2009  . ALLERGIC RHINITIS 05/16/2009  . URINARY INCONTINENCE 05/16/2009  . NEPHROLITHIASIS, HX OF 05/16/2009    Scot Jun, PT, DPT, OCS, ATC 03/06/21  8:39  AM    [facility] Physical Therapy [address] [place], [place], [address] Phone: [phone]   Fax:  [phone]  Name: Brandi Parker MRN: [id number] Date of Birth: 16-May-1956

## 2021-03-07 ENCOUNTER — Other Ambulatory Visit: Payer: Self-pay | Admitting: Orthopaedic Surgery

## 2021-03-09 ENCOUNTER — Other Ambulatory Visit: Payer: Self-pay

## 2021-03-09 ENCOUNTER — Encounter: Payer: Self-pay | Admitting: Rehabilitative and Restorative Service Providers"

## 2021-03-09 ENCOUNTER — Ambulatory Visit: Payer: BC Managed Care – PPO | Admitting: Rehabilitative and Restorative Service Providers"

## 2021-03-09 DIAGNOSIS — G8929 Other chronic pain: Secondary | ICD-10-CM

## 2021-03-09 DIAGNOSIS — R262 Difficulty in walking, not elsewhere classified: Secondary | ICD-10-CM

## 2021-03-09 DIAGNOSIS — M6281 Muscle weakness (generalized): Secondary | ICD-10-CM

## 2021-03-09 DIAGNOSIS — R293 Abnormal posture: Secondary | ICD-10-CM

## 2021-03-09 DIAGNOSIS — M79604 Pain in right leg: Secondary | ICD-10-CM | POA: Diagnosis not present

## 2021-03-09 DIAGNOSIS — M5441 Lumbago with sciatica, right side: Secondary | ICD-10-CM | POA: Diagnosis not present

## 2021-03-09 NOTE — Therapy (Signed)
[facility] Therapy [address] [place], [place], [address] Phone: [phone]   Fax:  [phone]  Physical Therapy Treatment  Patient Details  Name: Brandi Parker MRN: [id number] Date of Birth: 10-08-1956 Referring Provider (PT): Dr. Durward Fortes   Encounter Date: 03/09/2021   PT End of Session - 03/09/21 0903    Visit Number 5    Number of Visits 12    Date for PT Re-Evaluation 04/17/21    Progress Note Due on Visit 10    PT Start Time 0847    PT Stop Time 0925    PT Time Calculation (min) 38 min    Activity Tolerance Patient tolerated treatment well    Behavior During Therapy [facility] for tasks assessed/performed           Past Medical History:  Diagnosis Date  . ADHD (attention deficit hyperactivity disorder)   . Allergy   . Anemia   . Arthritis   . Brain injury ([place]) 1981   fell off a desk chair  . Chicken pox   . Depression   . Fracture, ankle   . Fracture, toe   . GERD (gastroesophageal reflux disease)   . Menopausal syndrome   . Migraines   . Nephrolithiasis   . Osteopenia 01/24/09   dexa scan  . Sleep apnea    uses cpap 08/2020  . Sleep apnea syndrome    could not tolerate CPAP  . Urinary incontinence     Past Surgical History:  Procedure Laterality Date  . CATARACT EXTRACTION, BILATERAL  04/2020  . COLONOSCOPY  10/14/09   Dr. Silvano Rusk, result clear repeat in 10 years  . DILATION AND CURETTAGE OF UTERUS    . KNEE ARTHROSCOPY  12-16-11   left knee, per Dr. Durward Fortes   . KNEE ARTHROSCOPY Right 02/22/2018   per Dr. Dorna Leitz , 2 Left arthroscopy-2015&2016  . REPAIR NONUNION / MALUNION METATARSAL / TARSAL BONES     right 5th  . TONSILLECTOMY      There were no vitals filed for this visit.   Subjective Assessment - 03/09/21 0849    Subjective Pt. indicated the percussive device did help and arrived today wanting to try additional myofascial release techniques.  Pt. indicated going up stairs today in clinic was  3-4/10 or so.  After 3pm in afternoon it increases to 7-8/10.    Pertinent History osteopenia, GERD, depression, ADHD.  Pt. gained about 30 lbs or so in last year.    Limitations Standing;Walking;Sitting    How long can you walk comfortably? limited sometimes from car to house (household distance)    Patient Stated Goals Reduce pain, sleep better    Currently in Pain? Yes    Pain Score 3     Pain Location Leg    Pain Orientation Right;Upper;Posterior    Pain Descriptors / Indicators Aching;Tightness    Pain Type Chronic pain    Pain Onset More than a month ago    Pain Frequency Intermittent    Aggravating Factors  static positioning, increased standing    Pain Relieving Factors treatment, percussive device              [facility] PT Assessment - 03/09/21 0001      Assessment   Medical Diagnosis Chronic Rt sided low back pain, rt sciatica    Referring Provider (PT) Dr. Durward Fortes    Onset Date/Surgical Date 09/19/20  [facility] PT Treatment/Exercise - 03/09/21 0001      Lumbar Exercises: Aerobic   Nustep Lvl 6 10 mins, around 60-70 rpm      Lumbar Exercises: Supine   Bridge 3 seconds;20 reps    Other Supine Lumbar Exercises supine sciatic nerve flossing (knee extension c PF, knee flexion c DF) 2 x 10    Other Supine Lumbar Exercises supine hamstring stretch c contralateral SLR 2 x 10 bilateral      Manual Therapy   Manual therapy comments compression to Rt hamstring middle 1/3, glute max Rt            Trigger Point Dry Needling - 03/09/21 0001    Consent Given? Yes    Education Handout Provided Previously provided    Muscles Treated Lower Quadrant Hamstring   Rt   Muscles Treated Back/Hip Gluteus maximus   Rt   Hamstring Response Twitch response elicited    Gluteus Maximus Response Twitch response elicited                  PT Short Term Goals - 03/09/21 0903      PT SHORT TERM GOAL #1   Title Patient will demonstrate  independent use of home exercise program to maintain progress from in clinic treatments.    Status Achieved             PT Long Term Goals - 03/02/21 0834      PT LONG TERM GOAL #1   Title Patient will demonstrate/report pain at worst less than or equal to 2/10 to facilitate minimal limitation in daily activity secondary to pain symptoms.    Status On-going      PT LONG TERM GOAL #2   Title Patient will demonstrate independent use of home exercise program to facilitate ability to maintain/progress functional gains from skilled physical therapy services.    Status On-going      PT LONG TERM GOAL #3   Title Patient will demonstrate lumbar extension 100 % WFL s symptoms to facilitate upright standing, walking posture at PLOF s limitation.    Status On-going      PT LONG TERM GOAL #4   Title Pt. will demonstrate FOTO outcome > = 55 to indicated reduced disability due to condition.    Status On-going      PT LONG TERM GOAL #5   Title Pt. will demonstrate Rt LE MMT 5/5 throughout to facilitate usual standing, walking, squat/transfers at PLOF s limitation    Status On-going      PT LONG TERM GOAL #6   Title Pt. will demonstrate equal passive SLR, slump testing Rt to Lt without symptoms to facilitate normal mobility at PLOF.    Status On-going                 Plan - 03/09/21 0915    Clinical Impression Statement Pt. did report concordant symptoms c twitch response, compression to hamstring, glute max TrP today.  Continued emphasis in HEP for mobility gains, overall LE strengthening.    Personal Factors and Comorbidities Comorbidity 3+;Time since onset of injury/illness/exacerbation    Comorbidities osteopenia, GERD, depression, ADHD    Examination-Activity Limitations Sit;Sleep;Squat;Bend;Caring for Others;Stairs;Carry;Stand;Dressing;Hygiene/Grooming;Lift;Locomotion Level;Transfers    Examination-Participation Restrictions Community Activity;Shop;Driving;Laundry;Cleaning     Stability/Clinical Decision Making Evolving/Moderate complexity    PT Duration 8 weeks    PT Treatment/Interventions ADLs/Self Care Home Management;Cryotherapy;Electrical Stimulation;Iontophoresis 4mg /ml Dexamethasone;Moist Heat;Traction;Balance training;Therapeutic exercise;Therapeutic activities;Functional mobility training;Stair training;Gait training;DME Instruction;Ultrasound;Neuromuscular re-education;Patient/family education;Manual techniques;Taping;Passive range  of motion;Dry needling;Joint Manipulations;Spinal Manipulations    PT Next Visit Plan Reassess manual intervention response.  Eccentric loading to hamstring for strengthening (machine, dead lift)    PT Home Exercise Plan [email or web address]    Consulted and Agree with Plan of Care Patient           Patient will benefit from skilled therapeutic intervention in order to improve the following deficits and impairments:  Abnormal gait,Decreased endurance,Hypomobility,Increased edema,Decreased activity tolerance,Decreased strength,Increased fascial restricitons,Pain,Increased muscle spasms,Difficulty walking,Decreased mobility,Decreased balance,Decreased range of motion,Impaired perceived functional ability,Improper body mechanics,Postural dysfunction,Impaired flexibility,Decreased coordination  Visit Diagnosis: Chronic right-sided low back pain with right-sided sciatica  Pain in right leg  Difficulty in walking, not elsewhere classified  Muscle weakness (generalized)  Abnormal posture     Problem List Patient Active Problem List   Diagnosis Date Noted  . Tremors of nervous system 02/18/2021  . Primary hypertension 02/18/2021  . Morbid obesity ([place]) 02/18/2021  . Depression with anxiety 02/18/2021  . Low back pain 11/10/2020  . Unilateral primary osteoarthritis, left knee 01/09/2019  . Left knee pain 01/09/2019  . TMJ arthropathy 11/08/2016  . Complex sleep apnea syndrome 04/15/2015  . ANEMIA-NOS 05/16/2009  . Attention deficit  hyperactivity disorder (ADHD) 05/16/2009  . ALLERGIC RHINITIS 05/16/2009  . URINARY INCONTINENCE 05/16/2009  . NEPHROLITHIASIS, HX OF 05/16/2009    Scot Jun, PT, DPT, OCS, ATC 03/09/21  9:21 AM    [facility] Physical Therapy [address] [place], [place], [address] Phone: [phone]   Fax:  [phone]  Name: Brandi Parker MRN: [id number] Date of Birth: 04-10-1956

## 2021-03-09 NOTE — Telephone Encounter (Signed)
Ok to renew?  

## 2021-03-10 ENCOUNTER — Other Ambulatory Visit: Payer: Self-pay

## 2021-03-11 ENCOUNTER — Ambulatory Visit: Payer: BC Managed Care – PPO | Admitting: Family Medicine

## 2021-03-11 ENCOUNTER — Encounter: Payer: Self-pay | Admitting: Family Medicine

## 2021-03-11 VITALS — BP 120/78 | HR 63 | Temp 98.4°F | Wt 187.4 lb

## 2021-03-11 DIAGNOSIS — R251 Tremor, unspecified: Secondary | ICD-10-CM | POA: Diagnosis not present

## 2021-03-11 DIAGNOSIS — I10 Essential (primary) hypertension: Secondary | ICD-10-CM

## 2021-03-11 DIAGNOSIS — F418 Other specified anxiety disorders: Secondary | ICD-10-CM

## 2021-03-11 NOTE — Progress Notes (Signed)
   Subjective:    Patient ID: Brandi Parker, female    DOB: 1956-05-19, 65 y.o.   MRN: [id number]  HPI Here to follow up on depression with anxiety and HTN. We met 3 weeks ago. At that time we stopped her Wellbutrin and increased her Lexapro to 20 mg daily. We also started her on Metoprolol succinate 50 mg daily. She feels much better now, her stress level is much lower, and the hand tremors have stopped. The only negative effect of these changes is she battles feeling sleepy in the afternoons.    Review of Systems  Constitutional: Negative.   Respiratory: Negative.   Cardiovascular: Negative.   Neurological: Negative for tremors.  Psychiatric/Behavioral: Negative.        Objective:   Physical Exam Constitutional:      Appearance: Normal appearance.  Cardiovascular:     Rate and Rhythm: Normal rate and regular rhythm.     Pulses: Normal pulses.     Heart sounds: Normal heart sounds.  Pulmonary:     Effort: Pulmonary effort is normal.     Breath sounds: Normal breath sounds.  Neurological:     General: No focal deficit present.     Mental Status: She is alert and oriented to person, place, and time.     Comments: No tremors   Psychiatric:        Mood and Affect: Mood normal.        Behavior: Behavior normal.        Thought Content: Thought content normal.        Judgment: Judgment normal.           Assessment & Plan:  Her depression and anxiety are now well controlled. Her HTN is well controlled, but I asked her to take the Metoprolol in the evening rather than in the morning. Recheck in 90 days.  Alysia Penna, MD

## 2021-03-13 ENCOUNTER — Other Ambulatory Visit: Payer: Self-pay

## 2021-03-13 ENCOUNTER — Encounter: Payer: Self-pay | Admitting: Rehabilitative and Restorative Service Providers"

## 2021-03-13 ENCOUNTER — Ambulatory Visit: Payer: BC Managed Care – PPO | Admitting: Rehabilitative and Restorative Service Providers"

## 2021-03-13 DIAGNOSIS — M6281 Muscle weakness (generalized): Secondary | ICD-10-CM

## 2021-03-13 DIAGNOSIS — R293 Abnormal posture: Secondary | ICD-10-CM

## 2021-03-13 DIAGNOSIS — R262 Difficulty in walking, not elsewhere classified: Secondary | ICD-10-CM

## 2021-03-13 DIAGNOSIS — G8929 Other chronic pain: Secondary | ICD-10-CM

## 2021-03-13 DIAGNOSIS — M79604 Pain in right leg: Secondary | ICD-10-CM

## 2021-03-13 DIAGNOSIS — M5441 Lumbago with sciatica, right side: Secondary | ICD-10-CM | POA: Diagnosis not present

## 2021-03-13 NOTE — Therapy (Signed)
[facility] Physical Therapy [address] [place], [place], [address] Phone: [phone]   Fax:  [phone]  Physical Therapy Treatment  Patient Details  Name: Brandi Parker MRN: [id number] Date of Birth: Jul 08, 1956 Referring Provider (PT): Dr. Durward Fortes   Encounter Date: 03/13/2021   PT End of Session - 03/13/21 0914    Visit Number 6    Number of Visits 12    Date for PT Re-Evaluation 04/17/21    Progress Note Due on Visit 10    PT Start Time 0854    PT Stop Time 0932    PT Time Calculation (min) 38 min    Activity Tolerance Patient tolerated treatment well    Behavior During Therapy [facility] for tasks assessed/performed           Past Medical History:  Diagnosis Date  . ADHD (attention deficit hyperactivity disorder)   . Allergy   . Anemia   . Arthritis   . Brain injury ([place]) 1981   fell off a desk chair  . Chicken pox   . Depression   . Fracture, ankle   . Fracture, toe   . GERD (gastroesophageal reflux disease)   . Menopausal syndrome   . Migraines   . Nephrolithiasis   . Osteopenia 01/24/09   dexa scan  . Sleep apnea    uses cpap 08/2020  . Sleep apnea syndrome    could not tolerate CPAP  . Urinary incontinence     Past Surgical History:  Procedure Laterality Date  . CATARACT EXTRACTION, BILATERAL  04/2020  . COLONOSCOPY  10/14/09   Dr. Silvano Rusk, result clear repeat in 10 years  . DILATION AND CURETTAGE OF UTERUS    . KNEE ARTHROSCOPY  12-16-11   left knee, per Dr. Durward Fortes   . KNEE ARTHROSCOPY Right 02/22/2018   per Dr. Dorna Leitz , 2 Left arthroscopy-2015&2016  . REPAIR NONUNION / MALUNION METATARSAL / TARSAL BONES     right 5th  . TONSILLECTOMY      There were no vitals filed for this visit.   Subjective Assessment - 03/13/21 0855    Subjective Pt. indicated the buttock seems to be most the symptoms today.  5/10 upon arrival today.    Pertinent History osteopenia, GERD, depression, ADHD.  Pt. gained about 30 lbs or  so in last year.    Limitations Standing;Walking;Sitting    How long can you walk comfortably? limited sometimes from car to house (household distance)    Patient Stated Goals Reduce pain, sleep better    Pain Score 5     Pain Location Leg    Pain Orientation Right;Upper;Proximal    Pain Descriptors / Indicators Aching;Tightness    Pain Type Chronic pain    Pain Onset More than a month ago    Pain Frequency Intermittent    Aggravating Factors  standing, walking              [facility] PT Assessment - 03/13/21 0001      Assessment   Medical Diagnosis Chronic Rt sided low back pain, rt sciatica    Referring Provider (PT) Dr. Durward Fortes    Onset Date/Surgical Date 09/19/20    Hand Dominance Right      AROM   Lumbar Flexion movement to floor, pulling in hamstring at end range    Lumbar Extension 75% no change in symptoms, REIS x 5 produced additional proximal posterior thigh/buttock complaints.  Prone press up x 5 ERP posterior thigh  Palpation   Spinal mobility PAIVM assessment with no radicular symptoms noted.  Mild resistance L4, L5 cPA                         [facility] Adult PT Treatment/Exercise - 03/13/21 0001      Lumbar Exercises: Stretches   Other Lumbar Stretch Exercise prone press up 2 x 10, standing lumbar extension x 10      Lumbar Exercises: Aerobic   Recumbent Bike Lvl 3 6 mins      Lumbar Exercises: Seated   Other Seated Lumbar Exercises eccentric hamstring curl 3 x 10 Rt LE blue band      Lumbar Exercises: Supine   Other Supine Lumbar Exercises supine sciatic nerve flossing (knee extension c PF, knee flexion c DF) 2 x 10    Other Supine Lumbar Exercises supine hamstring stretch c contralateral SLR 3 x 10 bilateral      Manual Therapy   Manual therapy comments cPA L3-L5 g3                  PT Education - 03/13/21 0911    Education Details direction preference extension based education.    Person(s) Educated Patient    Methods  Explanation    Comprehension Verbalized understanding            PT Short Term Goals - 03/09/21 0903      PT SHORT TERM GOAL #1   Title Patient will demonstrate independent use of home exercise program to maintain progress from in clinic treatments.    Status Achieved             PT Long Term Goals - 03/02/21 0834      PT LONG TERM GOAL #1   Title Patient will demonstrate/report pain at worst less than or equal to 2/10 to facilitate minimal limitation in daily activity secondary to pain symptoms.    Status On-going      PT LONG TERM GOAL #2   Title Patient will demonstrate independent use of home exercise program to facilitate ability to maintain/progress functional gains from skilled physical therapy services.    Status On-going      PT LONG TERM GOAL #3   Title Patient will demonstrate lumbar extension 100 % WFL s symptoms to facilitate upright standing, walking posture at PLOF s limitation.    Status On-going      PT LONG TERM GOAL #4   Title Pt. will demonstrate FOTO outcome > = 55 to indicated reduced disability due to condition.    Status On-going      PT LONG TERM GOAL #5   Title Pt. will demonstrate Rt LE MMT 5/5 throughout to facilitate usual standing, walking, squat/transfers at PLOF s limitation    Status On-going      PT LONG TERM GOAL #6   Title Pt. will demonstrate equal passive SLR, slump testing Rt to Lt without symptoms to facilitate normal mobility at PLOF.    Status On-going                 Plan - 03/13/21 0911    Clinical Impression Statement Reassessment today of lumbar movements for evaluation of symptom reaction.  Possible directional preference for extension observed in prone press up.  Reduced complaints noted radicularly c PAIVM today compared to evaluation showing improvement.  Performance of prone press up resulted in reduction of buttock complaints, improved lumbar mobility and reduction in thigh severity  but still noted.    Personal  Factors and Comorbidities Comorbidity 3+;Time since onset of injury/illness/exacerbation    Comorbidities osteopenia, GERD, depression, ADHD    Examination-Activity Limitations Sit;Sleep;Squat;Bend;Caring for Others;Stairs;Carry;Stand;Dressing;Hygiene/Grooming;Lift;Locomotion Level;Transfers    Examination-Participation Restrictions Community Activity;Shop;Driving;Laundry;Cleaning    Stability/Clinical Decision Making Evolving/Moderate complexity    PT Duration 8 weeks    PT Treatment/Interventions ADLs/Self Care Home Management;Cryotherapy;Electrical Stimulation;Iontophoresis 4mg /ml Dexamethasone;Moist Heat;Traction;Balance training;Therapeutic exercise;Therapeutic activities;Functional mobility training;Stair training;Gait training;DME Instruction;Ultrasound;Neuromuscular re-education;Patient/family education;Manual techniques;Taping;Passive range of motion;Dry needling;Joint Manipulations;Spinal Manipulations    PT Next Visit Plan Reassess prone extension based movements, PAIVM. Use DN prn, continue eccentric loading to hamstring for strengthening (machine, dead lift)    PT Home Exercise Plan [email or web address]    Consulted and Agree with Plan of Care Patient           Patient will benefit from skilled therapeutic intervention in order to improve the following deficits and impairments:  Abnormal gait,Decreased endurance,Hypomobility,Increased edema,Decreased activity tolerance,Decreased strength,Increased fascial restricitons,Pain,Increased muscle spasms,Difficulty walking,Decreased mobility,Decreased balance,Decreased range of motion,Impaired perceived functional ability,Improper body mechanics,Postural dysfunction,Impaired flexibility,Decreased coordination  Visit Diagnosis: Chronic right-sided low back pain with right-sided sciatica  Pain in right leg  Difficulty in walking, not elsewhere classified  Muscle weakness (generalized)  Abnormal posture     Problem List Patient Active Problem  List   Diagnosis Date Noted  . Tremors of nervous system 02/18/2021  . Primary hypertension 02/18/2021  . Morbid obesity ([place]) 02/18/2021  . Depression with anxiety 02/18/2021  . Low back pain 11/10/2020  . Unilateral primary osteoarthritis, left knee 01/09/2019  . Left knee pain 01/09/2019  . TMJ arthropathy 11/08/2016  . Complex sleep apnea syndrome 04/15/2015  . ANEMIA-NOS 05/16/2009  . Attention deficit hyperactivity disorder (ADHD) 05/16/2009  . ALLERGIC RHINITIS 05/16/2009  . URINARY INCONTINENCE 05/16/2009  . NEPHROLITHIASIS, HX OF 05/16/2009   Scot Jun, PT, DPT, OCS, ATC 03/13/21  9:28 AM    [facility] Physical Therapy [address] [place], [place], [address] Phone: [phone]   Fax:  [phone]  Name: Brandi Parker MRN: [id number] Date of Birth: 10-Feb-1956

## 2021-03-16 ENCOUNTER — Other Ambulatory Visit: Payer: Self-pay

## 2021-03-16 ENCOUNTER — Encounter: Payer: Self-pay | Admitting: Rehabilitative and Restorative Service Providers"

## 2021-03-16 ENCOUNTER — Ambulatory Visit: Payer: BC Managed Care – PPO | Admitting: Rehabilitative and Restorative Service Providers"

## 2021-03-16 ENCOUNTER — Telehealth: Payer: Self-pay | Admitting: Orthopaedic Surgery

## 2021-03-16 DIAGNOSIS — R262 Difficulty in walking, not elsewhere classified: Secondary | ICD-10-CM

## 2021-03-16 DIAGNOSIS — M79604 Pain in right leg: Secondary | ICD-10-CM

## 2021-03-16 DIAGNOSIS — M5441 Lumbago with sciatica, right side: Secondary | ICD-10-CM

## 2021-03-16 DIAGNOSIS — G8929 Other chronic pain: Secondary | ICD-10-CM

## 2021-03-16 DIAGNOSIS — M6281 Muscle weakness (generalized): Secondary | ICD-10-CM | POA: Diagnosis not present

## 2021-03-16 DIAGNOSIS — R293 Abnormal posture: Secondary | ICD-10-CM

## 2021-03-16 NOTE — Patient Instructions (Signed)
Access Code: [email or web address] URL: [email or web address] Date: 03/16/2021 Prepared by: Scot Jun  Exercises Supine Lower Trunk Rotation - 2 x daily - 7 x weekly - 1 sets - 5 reps - 15 hold Supine Figure 4 Piriformis Stretch - 2 x daily - 7 x weekly - 3 sets - 10 reps Supine 90/90 Sciatic Nerve Glide with Knee Flexion/Extension - 2 x daily - 7 x weekly - 3 sets - 10 reps Supine Posterior Pelvic Tilt - 2 x daily - 7 x weekly - 3 sets - 10 reps - 5 seconds hold Supine Bridge - 2 x daily - 7 x weekly - 10 reps - 3 sets - 2 hold Seated Hamstring Curl with Anchored Resistance - 2 x daily - 7 x weekly - 3 sets - 10 reps Prone Press Up - 2 x daily - 7 x weekly - 2-3 sets - 10 reps - 1-2 hold Standing Lumbar Extension - 2 x daily - 7 x weekly - 10 reps - 2-3 sets

## 2021-03-16 NOTE — Telephone Encounter (Signed)
Called and spoke with patient. I explained that the script for Methocarbamol should read to take as needed. She thought it was to be taken twice daily regardless.

## 2021-03-16 NOTE — Telephone Encounter (Signed)
Pt states that she was prescribed methocarbamol and it states to take twice a day but she was only given 30. Please call her back when fixed [phone]

## 2021-03-16 NOTE — Therapy (Signed)
[facility] Physical Therapy [address] [place], [place], [address] Phone: [phone]   Fax:  [phone]  Physical Therapy Treatment  Patient Details  Name: Brandi Parker MRN: [id number] Date of Birth: 12/05/1956 Referring Provider (PT): Dr. Durward Fortes   Encounter Date: 03/16/2021   PT End of Session - 03/16/21 0903    Visit Number 7    Number of Visits 12    Date for PT Re-Evaluation 04/17/21    Progress Note Due on Visit 10    PT Start Time 0852    PT Stop Time 0930    PT Time Calculation (min) 38 min    Activity Tolerance Patient tolerated treatment well    Behavior During Therapy [facility] for tasks assessed/performed           Past Medical History:  Diagnosis Date  . ADHD (attention deficit hyperactivity disorder)   . Allergy   . Anemia   . Arthritis   . Brain injury ([place]) 1981   fell off a desk chair  . Chicken pox   . Depression   . Fracture, ankle   . Fracture, toe   . GERD (gastroesophageal reflux disease)   . Menopausal syndrome   . Migraines   . Nephrolithiasis   . Osteopenia 01/24/09   dexa scan  . Sleep apnea    uses cpap 08/2020  . Sleep apnea syndrome    could not tolerate CPAP  . Urinary incontinence     Past Surgical History:  Procedure Laterality Date  . CATARACT EXTRACTION, BILATERAL  04/2020  . COLONOSCOPY  10/14/09   Dr. Silvano Rusk, result clear repeat in 10 years  . DILATION AND CURETTAGE OF UTERUS    . KNEE ARTHROSCOPY  12-16-11   left knee, per Dr. Durward Fortes   . KNEE ARTHROSCOPY Right 02/22/2018   per Dr. Dorna Leitz , 2 Left arthroscopy-2015&2016  . REPAIR NONUNION / MALUNION METATARSAL / TARSAL BONES     right 5th  . TONSILLECTOMY      There were no vitals filed for this visit.   Subjective Assessment - 03/16/21 0855    Subjective Pt. stated yesterday she did some cleaning in the house with some lifting, bending, moving.  Pt. stated lower back is hurting today.  Pt. stated things have been a little  better for leg c back stretches.  Pt. stated core is out of shape.    Pertinent History osteopenia, GERD, depression, ADHD.  Pt. gained about 30 lbs or so in last year.    Limitations Standing;Walking;Sitting    How long can you walk comfortably? limited sometimes from car to house (household distance)    Patient Stated Goals Reduce pain, sleep better    Currently in Pain? Yes    Pain Score 8     Pain Location Back    Pain Orientation Lower    Pain Descriptors / Indicators Aching;Tightness;Sore    Pain Type Chronic pain    Pain Radiating Towards Rt thigh (4/10)    Pain Onset More than a month ago    Pain Frequency Intermittent    Aggravating Factors  cleaning, moving things, standing                             [facility] Adult PT Treatment/Exercise - 03/16/21 0001      Lumbar Exercises: Stretches   Other Lumbar Stretch Exercise prone press up 2 x 10      Lumbar  Exercises: Aerobic   Recumbent Bike Lvl 3 8 mins      Lumbar Exercises: Seated   Other Seated Lumbar Exercises eccentric hamstring curl 3 x 10 Rt LE blue band      Lumbar Exercises: Supine   Bridge 20 reps;3 seconds      Manual Therapy   Manual therapy comments cPA L3-L5 g3, percussive device bilat lumbar paraspinals/ql, Rt hamstring, glute max                    PT Short Term Goals - 03/09/21 6256      PT SHORT TERM GOAL #1   Title Patient will demonstrate independent use of home exercise program to maintain progress from in clinic treatments.    Status Achieved             PT Long Term Goals - 03/02/21 0834      PT LONG TERM GOAL #1   Title Patient will demonstrate/report pain at worst less than or equal to 2/10 to facilitate minimal limitation in daily activity secondary to pain symptoms.    Status On-going      PT LONG TERM GOAL #2   Title Patient will demonstrate independent use of home exercise program to facilitate ability to maintain/progress functional gains from skilled  physical therapy services.    Status On-going      PT LONG TERM GOAL #3   Title Patient will demonstrate lumbar extension 100 % WFL s symptoms to facilitate upright standing, walking posture at PLOF s limitation.    Status On-going      PT LONG TERM GOAL #4   Title Pt. will demonstrate FOTO outcome > = 55 to indicated reduced disability due to condition.    Status On-going      PT LONG TERM GOAL #5   Title Pt. will demonstrate Rt LE MMT 5/5 throughout to facilitate usual standing, walking, squat/transfers at PLOF s limitation    Status On-going      PT LONG TERM GOAL #6   Title Pt. will demonstrate equal passive SLR, slump testing Rt to Lt without symptoms to facilitate normal mobility at PLOF.    Status On-going                 Plan - 03/16/21 0909    Clinical Impression Statement Presentation today of increased myofascial guarding, tenderness in lumbar spine today after activity cleaning.  Hamstring tenderness to touch still noted overall as well.  Performance of repeated prone press ups did seem to still reduce overall symptoms in Rt LE intensity at home and in clinic today.    Personal Factors and Comorbidities Comorbidity 3+;Time since onset of injury/illness/exacerbation    Comorbidities osteopenia, GERD, depression, ADHD    Examination-Activity Limitations Sit;Sleep;Squat;Bend;Caring for Others;Stairs;Carry;Stand;Dressing;Hygiene/Grooming;Lift;Locomotion Level;Transfers    Examination-Participation Restrictions Community Activity;Shop;Driving;Laundry;Cleaning    Stability/Clinical Decision Making Evolving/Moderate complexity    PT Duration 8 weeks    PT Treatment/Interventions ADLs/Self Care Home Management;Cryotherapy;Electrical Stimulation;Iontophoresis 4mg /ml Dexamethasone;Moist Heat;Traction;Balance training;Therapeutic exercise;Therapeutic activities;Functional mobility training;Stair training;Gait training;DME Instruction;Ultrasound;Neuromuscular  re-education;Patient/family education;Manual techniques;Taping;Passive range of motion;Dry needling;Joint Manipulations;Spinal Manipulations    PT Next Visit Plan Continue c reassessment of prone extension based movements, PAIVM. Use DN prn, continue eccentric loading to hamstring for strengthening    PT [place] and Agree with Plan of Care Patient           Patient will benefit from skilled therapeutic intervention in order to improve the  following deficits and impairments:  Abnormal gait,Decreased endurance,Hypomobility,Increased edema,Decreased activity tolerance,Decreased strength,Increased fascial restricitons,Pain,Increased muscle spasms,Difficulty walking,Decreased mobility,Decreased balance,Decreased range of motion,Impaired perceived functional ability,Improper body mechanics,Postural dysfunction,Impaired flexibility,Decreased coordination  Visit Diagnosis: Chronic right-sided low back pain with right-sided sciatica  Pain in right leg  Difficulty in walking, not elsewhere classified  Muscle weakness (generalized)  Abnormal posture     Problem List Patient Active Problem List   Diagnosis Date Noted  . Tremors of nervous system 02/18/2021  . Primary hypertension 02/18/2021  . Morbid obesity ([place]) 02/18/2021  . Depression with anxiety 02/18/2021  . Low back pain 11/10/2020  . Unilateral primary osteoarthritis, left knee 01/09/2019  . Left knee pain 01/09/2019  . TMJ arthropathy 11/08/2016  . Complex sleep apnea syndrome 04/15/2015  . ANEMIA-NOS 05/16/2009  . Attention deficit hyperactivity disorder (ADHD) 05/16/2009  . ALLERGIC RHINITIS 05/16/2009  . URINARY INCONTINENCE 05/16/2009  . NEPHROLITHIASIS, HX OF 05/16/2009    Scot Jun, PT, DPT, OCS, ATC 03/16/21  9:29 AM    [facility] Physical Therapy [address] [place], [place], [address] Phone: [phone]   Fax:  [phone]  Name: Brandi Parker MRN: [id number] Date of Birth: 1956-04-23

## 2021-03-20 ENCOUNTER — Ambulatory Visit: Payer: BC Managed Care – PPO | Admitting: Rehabilitative and Restorative Service Providers"

## 2021-03-20 ENCOUNTER — Other Ambulatory Visit: Payer: Self-pay

## 2021-03-20 DIAGNOSIS — R262 Difficulty in walking, not elsewhere classified: Secondary | ICD-10-CM | POA: Diagnosis not present

## 2021-03-20 DIAGNOSIS — G8929 Other chronic pain: Secondary | ICD-10-CM

## 2021-03-20 DIAGNOSIS — R293 Abnormal posture: Secondary | ICD-10-CM

## 2021-03-20 DIAGNOSIS — M5441 Lumbago with sciatica, right side: Secondary | ICD-10-CM | POA: Diagnosis not present

## 2021-03-20 DIAGNOSIS — M79604 Pain in right leg: Secondary | ICD-10-CM | POA: Diagnosis not present

## 2021-03-20 DIAGNOSIS — M6281 Muscle weakness (generalized): Secondary | ICD-10-CM

## 2021-03-20 NOTE — Therapy (Signed)
[facility] Physical Therapy [address] [place], [place], [address] Phone: [phone]   Fax:  [phone]  Physical Therapy Treatment  Patient Details  Name: Brandi Parker MRN: [id number] Date of Birth: 1956/02/04 Referring Provider (PT): Dr. Durward Fortes   Encounter Date: 03/20/2021   PT End of Session - 03/20/21 0915    Visit Number 8    Number of Visits 12    Date for PT Re-Evaluation 04/17/21    Progress Note Due on Visit 10    PT Start Time 0843    PT Stop Time 0923    PT Time Calculation (min) 40 min    Activity Tolerance Patient tolerated treatment well    Behavior During Therapy [facility] for tasks assessed/performed           Past Medical History:  Diagnosis Date  . ADHD (attention deficit hyperactivity disorder)   . Allergy   . Anemia   . Arthritis   . Brain injury ([place]) 1981   fell off a desk chair  . Chicken pox   . Depression   . Fracture, ankle   . Fracture, toe   . GERD (gastroesophageal reflux disease)   . Menopausal syndrome   . Migraines   . Nephrolithiasis   . Osteopenia 01/24/09   dexa scan  . Sleep apnea    uses cpap 08/2020  . Sleep apnea syndrome    could not tolerate CPAP  . Urinary incontinence     Past Surgical History:  Procedure Laterality Date  . CATARACT EXTRACTION, BILATERAL  04/2020  . COLONOSCOPY  10/14/09   Dr. Silvano Rusk, result clear repeat in 10 years  . DILATION AND CURETTAGE OF UTERUS    . KNEE ARTHROSCOPY  12-16-11   left knee, per Dr. Durward Fortes   . KNEE ARTHROSCOPY Right 02/22/2018   per Dr. Dorna Leitz , 2 Left arthroscopy-2015&2016  . REPAIR NONUNION / MALUNION METATARSAL / TARSAL BONES     right 5th  . TONSILLECTOMY      There were no vitals filed for this visit.   Subjective Assessment - 03/20/21 0857    Subjective Pt. stated feeling complaints in low back and Rt posterior leg.  Rated 4/10 in back and leg 3/10.  Still feeling some after effects of lifting and cleaning activity.  Evening  still worse.    Pertinent History osteopenia, GERD, depression, ADHD.  Pt. gained about 30 lbs or so in last year.    Limitations Standing;Walking;Sitting    How long can you walk comfortably? limited sometimes from car to house (household distance)    Patient Stated Goals Reduce pain, sleep better    Currently in Pain? Yes    Pain Score 4     Pain Location Back    Pain Orientation Lower    Pain Descriptors / Indicators Aching;Tightness;Sore    Pain Radiating Towards Rt thigh    Pain Onset More than a month ago    Aggravating Factors  cleaning, lifting    Pain Relieving Factors exercises at time, jt mobilizations in clinic.                             [place] Adult PT Treatment/Exercise - 03/20/21 0001      Lumbar Exercises: Stretches   Double Knee to Chest Stretch Other (comment)   10x   Figure 4 Stretch Other (comment)   flexion based 40 sec x 5 Rt   Other Lumbar  Stretch Exercise prone press up 3 x 10      Lumbar Exercises: Aerobic   Nustep Lvl 6 10 mins      Lumbar Exercises: Standing   Other Standing Lumbar Exercises lumbar extension x 10 (produced hip symptoms posterior, not thigh symptoms)      Lumbar Exercises: Supine   Other Supine Lumbar Exercises supine sciatic nerve flossing (knee extension c PF, knee flexion c DF)  x 10      Manual Therapy   Manual therapy comments cPA L2-L5 g3, Rt uPA L3-L5, percussive device to Rt glute max/piriformis                    PT Short Term Goals - 03/09/21 6222      PT SHORT TERM GOAL #1   Title Patient will demonstrate independent use of home exercise program to maintain progress from in clinic treatments.    Status Achieved             PT Long Term Goals - 03/02/21 0834      PT LONG TERM GOAL #1   Title Patient will demonstrate/report pain at worst less than or equal to 2/10 to facilitate minimal limitation in daily activity secondary to pain symptoms.    Status On-going      PT LONG TERM GOAL  #2   Title Patient will demonstrate independent use of home exercise program to facilitate ability to maintain/progress functional gains from skilled physical therapy services.    Status On-going      PT LONG TERM GOAL #3   Title Patient will demonstrate lumbar extension 100 % WFL s symptoms to facilitate upright standing, walking posture at PLOF s limitation.    Status On-going      PT LONG TERM GOAL #4   Title Pt. will demonstrate FOTO outcome > = 55 to indicated reduced disability due to condition.    Status On-going      PT LONG TERM GOAL #5   Title Pt. will demonstrate Rt LE MMT 5/5 throughout to facilitate usual standing, walking, squat/transfers at PLOF s limitation    Status On-going      PT LONG TERM GOAL #6   Title Pt. will demonstrate equal passive SLR, slump testing Rt to Lt without symptoms to facilitate normal mobility at PLOF.    Status On-going                 Plan - 03/20/21 0905    Clinical Impression Statement Reduced complaints c cPA and improved passive movement noted in reassessment today.  Still unclear direct response for any centralization/peripherialization noted in extension/lfexion activity  Discussion c Pt. today about possibility to transition to HEP for period of time to allow continued progression c use of HEP due to Pt. reported leveling off of symptom improvement as well as some financial cost concerns  Plan to review on next visit.    Personal Factors and Comorbidities Comorbidity 3+;Time since onset of injury/illness/exacerbation    Comorbidities osteopenia, GERD, depression, ADHD    Examination-Activity Limitations Sit;Sleep;Squat;Bend;Caring for Others;Stairs;Carry;Stand;Dressing;Hygiene/Grooming;Lift;Locomotion Level;Transfers    Examination-Participation Restrictions Community Activity;Shop;Driving;Laundry;Cleaning    Stability/Clinical Decision Making Evolving/Moderate complexity    PT Duration 8 weeks    PT Treatment/Interventions  ADLs/Self Care Home Management;Cryotherapy;Electrical Stimulation;Iontophoresis 4mg /ml Dexamethasone;Moist Heat;Traction;Balance training;Therapeutic exercise;Therapeutic activities;Functional mobility training;Stair training;Gait training;DME Instruction;Ultrasound;Neuromuscular re-education;Patient/family education;Manual techniques;Taping;Passive range of motion;Dry needling;Joint Manipulations;Spinal Manipulations    PT Next Visit Plan Reassessment for possible HEP transitioning,  Use DN prn,  continue eccentric loading to hamstring for strengthening    PT [place] and Agree with Plan of Care Patient           Patient will benefit from skilled therapeutic intervention in order to improve the following deficits and impairments:  Abnormal gait,Decreased endurance,Hypomobility,Increased edema,Decreased activity tolerance,Decreased strength,Increased fascial restricitons,Pain,Increased muscle spasms,Difficulty walking,Decreased mobility,Decreased balance,Decreased range of motion,Impaired perceived functional ability,Improper body mechanics,Postural dysfunction,Impaired flexibility,Decreased coordination  Visit Diagnosis: Chronic right-sided low back pain with right-sided sciatica  Pain in right leg  Difficulty in walking, not elsewhere classified  Muscle weakness (generalized)  Abnormal posture     Problem List Patient Active Problem List   Diagnosis Date Noted  . Tremors of nervous system 02/18/2021  . Primary hypertension 02/18/2021  . Morbid obesity ([place]) 02/18/2021  . Depression with anxiety 02/18/2021  . Low back pain 11/10/2020  . Unilateral primary osteoarthritis, left knee 01/09/2019  . Left knee pain 01/09/2019  . TMJ arthropathy 11/08/2016  . Complex sleep apnea syndrome 04/15/2015  . ANEMIA-NOS 05/16/2009  . Attention deficit hyperactivity disorder (ADHD) 05/16/2009  . ALLERGIC RHINITIS 05/16/2009  . URINARY INCONTINENCE 05/16/2009   . NEPHROLITHIASIS, HX OF 05/16/2009    Scot Jun, PT, DPT, OCS, ATC 03/20/21  9:25 AM    [facility] Physical Therapy [address] [place], [place], [address] Phone: [phone]   Fax:  [phone]  Name: Brandi Parker MRN: [id number] Date of Birth: 01-01-1956

## 2021-04-06 ENCOUNTER — Ambulatory Visit: Payer: BC Managed Care – PPO | Admitting: Physical Therapy

## 2021-04-06 ENCOUNTER — Other Ambulatory Visit: Payer: Self-pay

## 2021-04-06 ENCOUNTER — Encounter: Payer: Self-pay | Admitting: Physical Therapy

## 2021-04-06 DIAGNOSIS — R262 Difficulty in walking, not elsewhere classified: Secondary | ICD-10-CM | POA: Diagnosis not present

## 2021-04-06 DIAGNOSIS — M5441 Lumbago with sciatica, right side: Secondary | ICD-10-CM | POA: Diagnosis not present

## 2021-04-06 DIAGNOSIS — M6281 Muscle weakness (generalized): Secondary | ICD-10-CM

## 2021-04-06 DIAGNOSIS — R293 Abnormal posture: Secondary | ICD-10-CM

## 2021-04-06 DIAGNOSIS — G8929 Other chronic pain: Secondary | ICD-10-CM

## 2021-04-06 DIAGNOSIS — M79604 Pain in right leg: Secondary | ICD-10-CM | POA: Diagnosis not present

## 2021-04-06 NOTE — Therapy (Signed)
[facility] Physical Therapy [address] [place], [place], [address] Phone: [phone]   Fax:  [phone]  Physical Therapy Treatment  Patient Details  Name: Brandi Parker MRN: [id number] Date of Birth: [birth year] Referring Provider (PT): Dr. Durward Fortes   Encounter Date: 04/06/2021   PT End of Session - 04/06/21 1456    Visit Number 9    Number of Visits 12    Date for PT Re-Evaluation 04/17/21    Progress Note Due on Visit 10    PT Start Time 1435    PT Stop Time 1515    PT Time Calculation (min) 40 min    Activity Tolerance Patient tolerated treatment well    Behavior During Therapy [facility] for tasks assessed/performed           Past Medical History:  Diagnosis Date  . ADHD (attention deficit hyperactivity disorder)   . Allergy   . Anemia   . Arthritis   . Brain injury ([place]) 1981   fell off a desk chair  . Chicken pox   . Depression   . Fracture, ankle   . Fracture, toe   . GERD (gastroesophageal reflux disease)   . Menopausal syndrome   . Migraines   . Nephrolithiasis   . Osteopenia 01/24/09   dexa scan  . Sleep apnea    uses cpap 08/2020  . Sleep apnea syndrome    could not tolerate CPAP  . Urinary incontinence     Past Surgical History:  Procedure Laterality Date  . CATARACT EXTRACTION, BILATERAL  04/2020  . COLONOSCOPY  10/14/09   Dr. Silvano Rusk, result clear repeat in 10 years  . DILATION AND CURETTAGE OF UTERUS    . KNEE ARTHROSCOPY  12-16-11   left knee, per Dr. Durward Fortes   . KNEE ARTHROSCOPY Right 02/22/2018   per Dr. Dorna Leitz , 2 Left arthroscopy-2015&2016  . REPAIR NONUNION / MALUNION METATARSAL / TARSAL BONES     right 5th  . TONSILLECTOMY      There were no vitals filed for this visit.   Subjective Assessment - 04/06/21 1441    Subjective Pt states she has been working out at the [employer] in [employer]. Pt still reporting pain down left LE and left glutes/hip. Pt feels like her exercises are not changing her  pain, but increasing her strength. Pt feels like the mobilizations have helped. Pt reporting her visits are becoming expensive and would like to reduce her frequency. Pt did also report a fall when she sat in a chiar and forgot to attach the pins. Pt now reporting intermittent pain on the right from the fall on her bottom. Pt reported she took an extra strength tylenol this morning.    Pertinent History osteopenia, GERD, depression, ADHD.  Pt. gained about 30 lbs or so in last year.    Limitations Standing;Walking;Sitting    How long can you walk comfortably? limited sometimes from car to house (household distance)    Patient Stated Goals Reduce pain, sleep better    Currently in Pain? Yes    Pain Score 2     Pain Location Back    Pain Orientation Lower    Pain Descriptors / Indicators Aching;Sore;Tightness    Pain Type Chronic pain    Pain Onset More than a month ago                             [facility] Adult PT  Treatment/Exercise - 04/06/21 0001      Lumbar Exercises: Stretches   Other Lumbar Stretch Exercise QL door way stretch    Other Lumbar Stretch Exercise child's pose x 4 holding 20 seconds, forward and then repeated to each side x 2 holding 10 seconds      Lumbar Exercises: Aerobic   Nustep Lvl 6 8 mins      Lumbar Exercises: Standing   Other Standing Lumbar Exercises lumbar extension elbows against wall x 5 holding 10 seconds, no change in symptoms which extend into pt's posterior thigh      Manual Therapy   Manual Therapy Soft tissue mobilization;Joint mobilization    Manual therapy comments long axis distraction x 3 holding 30 seconds to right LE    Joint Mobilization grade 2-3 PA joint mobs to L2-L5    Soft tissue mobilization active trigger point release and soft tissue mobilizaitons to right QL and gluteals and piriformis                    PT Short Term Goals - 04/06/21 1624      PT SHORT TERM GOAL #1   Title Patient will demonstrate  independent use of home exercise program to maintain progress from in clinic treatments.    Status Achieved             PT Long Term Goals - 04/06/21 1624      PT LONG TERM GOAL #1   Title Patient will demonstrate/report pain at worst less than or equal to 2/10 to facilitate minimal limitation in daily activity secondary to pain symptoms.    Status On-going      PT LONG TERM GOAL #2   Title Patient will demonstrate independent use of home exercise program to facilitate ability to maintain/progress functional gains from skilled physical therapy services.    Status On-going      PT LONG TERM GOAL #3   Title Patient will demonstrate lumbar extension 100 % WFL s symptoms to facilitate upright standing, walking posture at PLOF s limitation.    Status On-going      PT LONG TERM GOAL #4   Title Pt. will demonstrate FOTO outcome > = 55 to indicated reduced disability due to condition.    Status On-going      PT LONG TERM GOAL #5   Title Pt. will demonstrate Rt LE MMT 5/5 throughout to facilitate usual standing, walking, squat/transfers at PLOF s limitation    Status On-going      PT LONG TERM GOAL #6   Title Pt. will demonstrate equal passive SLR, slump testing Rt to Lt without symptoms to facilitate normal mobility at PLOF.    Status On-going                 Plan - 04/06/21 1620    Clinical Impression Statement We discussed dropping frequency to 1x/ week or every other week based on pt's financial situation. Pt reporting more relief at the end of today's session in her low back following stretches, mobs, and manual therapy. Pt with no increased pain when performing extension activities. Continue to address pt's concerns and continue toward goals set.    Personal Factors and Comorbidities Comorbidity 3+;Time since onset of injury/illness/exacerbation    Comorbidities osteopenia, GERD, depression, ADHD    Examination-Activity Limitations Sit;Sleep;Squat;Bend;Caring for  Others;Stairs;Carry;Stand;Dressing;Hygiene/Grooming;Lift;Locomotion Level;Transfers    Examination-Participation Restrictions Community Activity;Shop;Driving;Laundry;Cleaning    Stability/Clinical Decision Making Evolving/Moderate complexity    PT Duration 8 weeks  PT Treatment/Interventions ADLs/Self Care Home Management;Cryotherapy;Electrical Stimulation;Iontophoresis 4mg /ml Dexamethasone;Moist Heat;Traction;Balance training;Therapeutic exercise;Therapeutic activities;Functional mobility training;Stair training;Gait training;DME Instruction;Ultrasound;Neuromuscular re-education;Patient/family education;Manual techniques;Taping;Passive range of motion;Dry needling;Joint Manipulations;Spinal Manipulations    PT Next Visit Plan Reassessment for possible HEP transitioning,  Use DN prn, long axis distraction to right LE, spinal mobs, QL strething in doorway and in child pose position    PT [place] and Agree with Plan of Care Patient           Patient will benefit from skilled therapeutic intervention in order to improve the following deficits and impairments:  Abnormal gait,Decreased endurance,Hypomobility,Increased edema,Decreased activity tolerance,Decreased strength,Increased fascial restricitons,Pain,Increased muscle spasms,Difficulty walking,Decreased mobility,Decreased balance,Decreased range of motion,Impaired perceived functional ability,Improper body mechanics,Postural dysfunction,Impaired flexibility,Decreased coordination  Visit Diagnosis: Chronic right-sided low back pain with right-sided sciatica  Pain in right leg  Difficulty in walking, not elsewhere classified  Muscle weakness (generalized)  Abnormal posture     Problem List Patient Active Problem List   Diagnosis Date Noted  . Tremors of nervous system 02/18/2021  . Primary hypertension 02/18/2021  . Morbid obesity ([place]) 02/18/2021  . Depression with anxiety 02/18/2021  . Low back  pain 11/10/2020  . Unilateral primary osteoarthritis, left knee 01/09/2019  . Left knee pain 01/09/2019  . TMJ arthropathy 11/08/2016  . Complex sleep apnea syndrome 04/15/2015  . ANEMIA-NOS 05/16/2009  . Attention deficit hyperactivity disorder (ADHD) 05/16/2009  . ALLERGIC RHINITIS 05/16/2009  . URINARY INCONTINENCE 05/16/2009  . NEPHROLITHIASIS, HX OF 05/16/2009    Oretha Caprice, PT, MPT 04/06/2021, 4:27 PM  [facility] Physical Therapy [address] [place], [place], [address] Phone: [phone]   Fax:  [phone]  Name: Brandi Parker MRN: [id number] Date of Birth: July 05, 1956

## 2021-04-08 ENCOUNTER — Telehealth: Payer: Self-pay | Admitting: Family Medicine

## 2021-04-08 MED ORDER — METHYLPHENIDATE HCL ER (OSM) 36 MG PO TBCR: 36.0000 mg | EXTENDED_RELEASE_TABLET | Freq: Every day | ORAL | 0 refills | Status: DC

## 2021-04-08 MED ORDER — METHYLPHENIDATE HCL ER (OSM) 36 MG PO TBCR
36.0000 mg | EXTENDED_RELEASE_TABLET | Freq: Every day | ORAL | 0 refills | Status: DC
Start: 2021-06-08 — End: 2021-08-13

## 2021-04-08 NOTE — Telephone Encounter (Signed)
Called patient to inform prescription sent to pharmacy

## 2021-04-08 NOTE — Telephone Encounter (Signed)
Done

## 2021-04-08 NOTE — Telephone Encounter (Signed)
Last refill- 11/03/2020--30 tabs no refills Last office visit- 03/11/21  No future appointment has been schedule

## 2021-04-08 NOTE — Telephone Encounter (Signed)
Patient is calling and is requesting a refill for methylphenidate (CONCERTA) 36 MG PO CR tablet sent to  [facility] [address], [place], [place] [address]  Phone:  [phone] Fax:  [phone]  CB is [phone]

## 2021-04-13 ENCOUNTER — Other Ambulatory Visit: Payer: Self-pay

## 2021-04-13 ENCOUNTER — Ambulatory Visit: Payer: BC Managed Care – PPO | Admitting: Family Medicine

## 2021-04-13 ENCOUNTER — Encounter: Payer: Self-pay | Admitting: Family Medicine

## 2021-04-13 VITALS — BP 116/80 | HR 59 | Temp 99.0°F | Wt 191.2 lb

## 2021-04-13 DIAGNOSIS — R300 Dysuria: Secondary | ICD-10-CM | POA: Diagnosis not present

## 2021-04-13 DIAGNOSIS — R1031 Right lower quadrant pain: Secondary | ICD-10-CM

## 2021-04-13 DIAGNOSIS — G8929 Other chronic pain: Secondary | ICD-10-CM | POA: Diagnosis not present

## 2021-04-13 DIAGNOSIS — M5441 Lumbago with sciatica, right side: Secondary | ICD-10-CM

## 2021-04-13 LAB — POCT URINALYSIS DIPSTICK
Bilirubin, UA: NEGATIVE
Blood, UA: NEGATIVE
Glucose, UA: NEGATIVE
Ketones, UA: NEGATIVE
Leukocytes, UA: NEGATIVE
Nitrite, UA: NEGATIVE
Protein, UA: NEGATIVE
Spec Grav, UA: 1.015 (ref 1.010–1.025)
Urobilinogen, UA: NEGATIVE E.U./dL — AB
pH, UA: 7 (ref 5.0–8.0)

## 2021-04-13 LAB — CBC WITH DIFFERENTIAL/PLATELET
Basophils Absolute: 0.1 10*3/uL (ref 0.0–0.1)
Basophils Relative: 0.9 % (ref 0.0–3.0)
Eosinophils Absolute: 0.3 10*3/uL (ref 0.0–0.7)
Eosinophils Relative: 3.8 % (ref 0.0–5.0)
HCT: 37.7 % (ref 36.0–46.0)
Hemoglobin: 12.1 g/dL (ref 12.0–15.0)
Lymphocytes Relative: 27.5 % (ref 12.0–46.0)
Lymphs Abs: 2.2 10*3/uL (ref 0.7–4.0)
MCHC: 32.1 g/dL (ref 30.0–36.0)
MCV: 79.3 fl (ref 78.0–100.0)
Monocytes Absolute: 0.7 10*3/uL (ref 0.1–1.0)
Monocytes Relative: 9 % (ref 3.0–12.0)
Neutro Abs: 4.7 10*3/uL (ref 1.4–7.7)
Neutrophils Relative %: 58.8 % (ref 43.0–77.0)
Platelets: 288 10*3/uL (ref 150.0–400.0)
RBC: 4.75 Mil/uL (ref 3.87–5.11)
RDW: 14.7 % (ref 11.5–15.5)
WBC: 7.9 10*3/uL (ref 4.0–10.5)

## 2021-04-13 LAB — COMPREHENSIVE METABOLIC PANEL
ALT: 15 U/L (ref 0–35)
AST: 19 U/L (ref 0–37)
Albumin: 3.8 g/dL (ref 3.5–5.2)
Alkaline Phosphatase: 89 U/L (ref 39–117)
BUN: 18 mg/dL (ref 6–23)
CO2: 28 mEq/L (ref 19–32)
Calcium: 9.2 mg/dL (ref 8.4–10.5)
Chloride: 106 mEq/L (ref 96–112)
Creatinine, Ser: 0.84 mg/dL (ref 0.40–1.20)
GFR: 73.37 mL/min (ref >60.00)
Glucose, Bld: 88 mg/dL (ref 70–99)
Potassium: 4.3 mEq/L (ref 3.5–5.1)
Sodium: 141 mEq/L (ref 135–145)
Total Bilirubin: 0.4 mg/dL (ref 0.2–1.2)
Total Protein: 6.5 g/dL (ref 6.0–8.3)

## 2021-04-13 NOTE — Patient Instructions (Addendum)
We will obtain labs this visit.  An order for CT scan stone study was placed.  You will be called about scheduling this appointment.  We will give you a strainer to see if you can catch any possible stones continue to increase your intake of water and fluids. Continue current medications to help with the pain/discomfort. For increased or worsening symptoms you should be reevaluated.    Flank Pain, Adult Flank pain is pain that is located on the side of the body between the upper abdomen and the back. This area is called the flank. The pain may occur over a short period of time (acute), or it may be long-term or recurring (chronic). It may be mild or severe. Flank pain can be caused by many things, including:  Muscle soreness or injury.  Kidney stones or kidney disease.  Stress.  A disease of the spine (vertebral disk disease).  A lung infection (pneumonia).  Fluid around the lungs (pulmonary edema).  A skin rash caused by the chickenpox virus (shingles).  Tumors that affect the back of the abdomen.  Gallbladder disease. Follow these instructions at home:  Drink enough fluid to keep your urine clear or pale yellow.  Rest as told by your health care provider.  Take over-the-counter and prescription medicines only as told by your health care provider.  Keep a journal to track what has caused your flank pain and what has made it feel better.  Keep all follow-up visits as told by your health care provider. This is important.   Contact a health care provider if:  Your pain is not controlled with medicine.  You have new symptoms.  Your pain gets worse.  You have a fever.  Your symptoms last longer than 2-3 days.  You have trouble urinating or you are urinating very frequently. Get help right away if:  You have trouble breathing or you are short of breath.  Your abdomen hurts or it is swollen or red.  You have nausea or vomiting.  You feel faint or you pass out.  You  have blood in your urine. Summary  Flank pain is pain that is located on the side of the body between the upper abdomen and the back.  The pain may occur over a short period of time (acute), or it may be long-term or recurring (chronic). It may be mild or severe.  Flank pain can be caused by many things.  Contact your health care provider if your symptoms get worse or they last longer than 2-3 days. This information is not intended to replace advice given to you by your health care provider. Make sure you discuss any questions you have with your health care provider. Document Revised: 08/29/2020 Document Reviewed: 08/29/2020 Elsevier Patient Education  2021 [place].  Abdominal Pain, Adult Pain in the abdomen (abdominal pain) can be caused by many things. Often, abdominal pain is not serious and it gets better with no treatment or by being treated at home. However, sometimes abdominal pain is serious. Your health care provider will ask questions about your medical history and do a physical exam to try to determine the cause of your abdominal pain. Follow these instructions at home: Medicines  Take over-the-counter and prescription medicines only as told by your health care provider.  Do not take a laxative unless told by your health care provider. General instructions  Watch your condition for any changes.  Drink enough fluid to keep your urine pale yellow.  Keep all  follow-up visits as told by your health care provider. This is important.   Contact a health care provider if:  Your abdominal pain changes or gets worse.  You are not hungry or you lose weight without trying.  You are constipated or have diarrhea for more than 2-3 days.  You have pain when you urinate or have a bowel movement.  Your abdominal pain wakes you up at night.  Your pain gets worse with meals, after eating, or with certain foods.  You are vomiting and cannot keep anything down.  You have a  fever.  You have blood in your urine. Get help right away if:  Your pain does not go away as soon as your health care provider told you to expect.  You cannot stop vomiting.  Your pain is only in areas of the abdomen, such as the right side or the left lower portion of the abdomen. Pain on the right side could be caused by appendicitis.  You have bloody or black stools, or stools that look like tar.  You have severe pain, cramping, or bloating in your abdomen.  You have signs of dehydration, such as: ? Dark urine, very little urine, or no urine. ? Cracked lips. ? Dry mouth. ? Sunken eyes. ? Sleepiness. ? Weakness.  You have trouble breathing or chest pain. Summary  Often, abdominal pain is not serious and it gets better with no treatment or by being treated at home. However, sometimes abdominal pain is serious.  Watch your condition for any changes.  Take over-the-counter and prescription medicines only as told by your health care provider.  Contact a health care provider if your abdominal pain changes or gets worse.  Get help right away if you have severe pain, cramping, or bloating in your abdomen. This information is not intended to replace advice given to you by your health care provider. Make sure you discuss any questions you have with your health care provider. Document Revised: 01/25/2020 Document Reviewed: 04/16/2019 Elsevier Patient Education  [place].

## 2021-04-13 NOTE — Progress Notes (Signed)
Subjective:    Patient ID: Brandi Parker, female    DOB: 02-Nov-1956, 65 y.o.   MRN: [id number]  No chief complaint on file.   HPI Patient is a 65 yo female with pmh sig for ADHD, allergies, GERD, chronic back pain, history of nephrolithiasis, osteopenia, sleep apnea, HTN, obesity, depression, anxiety who is followed by Dr. Sarajane Jews and seen today for acute concern. Patient endorses history of ongoing back issues seen by Ortho and in physical therapy.  Over the last few days pt with increased soreness and right low back into the groin.  Pain is anywhere from a 4-8/10.  Became worse on drive over to clinic with nausea.  Pt unpacking boxes at her house which may have contributed to the increased pain.  Went to [employer] today.  Denis emesis, constipation, diarrhea, overt hematuria.  Had renal calculi in the past feels similar.  Increased intake of water after using a home urine strip which may have suggested urinary issues.  Pt still has her appendix.  Past Medical History:  Diagnosis Date  . ADHD (attention deficit hyperactivity disorder)   . Allergy   . Anemia   . Arthritis   . Brain injury ([place]) 1981   fell off a desk chair  . Chicken pox   . Depression   . Fracture, ankle   . Fracture, toe   . GERD (gastroesophageal reflux disease)   . Menopausal syndrome   . Migraines   . Nephrolithiasis   . Osteopenia 01/24/09   dexa scan  . Sleep apnea    uses cpap 08/2020  . Sleep apnea syndrome    could not tolerate CPAP  . Urinary incontinence     Allergies  Allergen Reactions  . Coconut Flavor Swelling    coconut  . Penicillins Swelling    REACTION: anaphylaxis REACTION: anaphylaxis  . Sulfamethoxazole-Trimethoprim Swelling    REACTION: rash REACTION: rash  . Strawberry Flavor     All berries    ROS General: Denies fever, chills, night sweats, changes in weight, changes in appetite HEENT: Denies headaches, ear pain, changes in vision, rhinorrhea, sore throat CV: Denies CP,  palpitations, SOB, orthopnea Pulm: Denies SOB, cough, wheezing GI: Denies abdominal pain, nausea, vomiting, diarrhea, constipation +nausea GU: Denies dysuria, hematuria, frequency, vaginal discharge  +dysuria, R inguinal pain Msk: Denies muscle cramps, joint pains  +chronic R sided low back pain, sciatica Neuro: Denies weakness, numbness, tingling Skin: Denies rashes, bruising Psych: Denies depression, anxiety, hallucinations      Objective:    Blood pressure 116/80, pulse (!) 59, temperature 99 F (37.2 C), temperature source Oral, weight 191 lb 3.2 oz (86.7 kg), SpO2 98 %.  Gen. Pleasant, well-nourished, in no distress, normal affect  HEENT: [place]/AT, face symmetric, conjunctiva clear, no scleral icterus, PERRLA, EOMI, nares patent without drainage Lungs: no accessory muscle use, CTAB, no wheezes or rales Cardiovascular: RRR, no m/r/g, no peripheral edema Abdomen: BS present, soft, ND, RLQ TTP, no hepatosplenomegaly.  No CVA tenderness. Musculoskeletal: TTP of lumbar spine and paraspinal muscles.  Positive log roll on R and straight leg raise b/l.  No deformities, no cyanosis or clubbing, normal tone Neuro:  A&Ox3, CN II-XII intact, normal gait Skin:  Warm, no lesions/ rash  Wt Readings from Last 3 Encounters:  04/13/21 191 lb 3.2 oz (86.7 kg)  03/11/21 187 lb 6.4 oz (85 kg)  02/18/21 193 lb (87.5 kg)    Lab Results  Component Value Date   WBC 6.7 07/08/2020  HGB 13.6 07/08/2020   HCT 43.5 07/08/2020   PLT 322 07/08/2020   GLUCOSE 101 (H) 07/08/2020   CHOL 179 07/08/2020   TRIG 70 07/08/2020   HDL 61 07/08/2020   LDLCALC 102 (H) 07/08/2020   ALT 19 07/08/2020   AST 21 07/08/2020   NA 141 07/08/2020   K 5.0 07/08/2020   CL 106 07/08/2020   CREATININE 0.95 07/08/2020   BUN 24 07/08/2020   CO2 29 07/08/2020   TSH 2.74 07/08/2020   HGBA1C 5.9 02/18/2021    Assessment/Plan:  Dysuria  -UA negative without hematuria - Plan: POCT urinalysis dipstick, Culture,  Urine  Chronic right-sided low back pain with right-sided sciatica  - Plan: Culture, Urine, Calcium, ionized, CMP  Acute right lower quadrant pain  - Plan: Culture, Urine, CBC with Differential/Platelet, CMP, CT RENAL STONE STUDY  Right lower quadrant abdominal pain  - Plan: CT RENAL STONE STUDY  Given patient's history of previous renal calculi we will obtain labs and imaging.  Discussed obtaining urine culture and UA largely negative without hematuria.  We will also obtain renal stone study.  Discussed concern for appendicitis given right lower quadrant pain.  Will obtain CBC.  Patient given strict precautions while waiting on results.  Patient to increase p.o. intake of water and fluids.  Given strainer to catch and possible stones.  Continue home medications as needed for any pain/discomfort.  Continue PT and follow-up with Ortho for chronic low back pain with sciatica.  F/u prn  Grier Mitts, MD

## 2021-04-14 ENCOUNTER — Ambulatory Visit (INDEPENDENT_AMBULATORY_CARE_PROVIDER_SITE_OTHER)
Admission: RE | Admit: 2021-04-14 | Discharge: 2021-04-14 | Disposition: A | Discharge status: Home / Self Care | Payer: BC Managed Care – PPO | Source: Ambulatory Visit | Attending: Family Medicine | Admitting: Family Medicine

## 2021-04-14 DIAGNOSIS — R1031 Right lower quadrant pain: Secondary | ICD-10-CM

## 2021-04-14 LAB — URINE CULTURE
MICRO NUMBER:: 11809671
SPECIMEN QUALITY:: ADEQUATE

## 2021-04-14 LAB — CALCIUM, IONIZED: Calcium, Ion: 5.12 mg/dL (ref 4.8–5.6)

## 2021-04-17 NOTE — Progress Notes (Signed)
Results viewed on MyChart. 

## 2021-04-20 NOTE — Progress Notes (Signed)
Patient viewed results on MyChart. 

## 2021-04-21 ENCOUNTER — Other Ambulatory Visit: Payer: Self-pay

## 2021-04-21 ENCOUNTER — Ambulatory Visit: Payer: BC Managed Care – PPO | Admitting: Physical Therapy

## 2021-04-21 ENCOUNTER — Encounter: Payer: Self-pay | Admitting: Physical Therapy

## 2021-04-21 ENCOUNTER — Ambulatory Visit: Payer: BC Managed Care – PPO | Admitting: Pulmonary Disease

## 2021-04-21 ENCOUNTER — Encounter: Payer: Self-pay | Admitting: Pulmonary Disease

## 2021-04-21 VITALS — BP 124/70 | HR 76 | Temp 97.6°F | Ht 64.0 in | Wt 188.2 lb

## 2021-04-21 DIAGNOSIS — M79604 Pain in right leg: Secondary | ICD-10-CM | POA: Diagnosis not present

## 2021-04-21 DIAGNOSIS — M6281 Muscle weakness (generalized): Secondary | ICD-10-CM

## 2021-04-21 DIAGNOSIS — M5441 Lumbago with sciatica, right side: Secondary | ICD-10-CM

## 2021-04-21 DIAGNOSIS — R293 Abnormal posture: Secondary | ICD-10-CM

## 2021-04-21 DIAGNOSIS — G4733 Obstructive sleep apnea (adult) (pediatric): Secondary | ICD-10-CM | POA: Diagnosis not present

## 2021-04-21 DIAGNOSIS — R262 Difficulty in walking, not elsewhere classified: Secondary | ICD-10-CM | POA: Diagnosis not present

## 2021-04-21 DIAGNOSIS — G8929 Other chronic pain: Secondary | ICD-10-CM

## 2021-04-21 NOTE — Patient Instructions (Signed)
Follow up in 5 months

## 2021-04-21 NOTE — Patient Instructions (Signed)
Access Code: [email or web address] URL: [email or web address] Date: 04/21/2021 Prepared by: Kearney Hard  Exercises Lat Pull Down - 2 sets - 10 reps Seated [address] - 2 sets - 10 reps Single Leg Knee Extension with Weight Machine - 2 sets - 10 reps Single Leg Hamstring Curl with Weight Machine - 2 sets - 10 reps Full Leg Press - 2 sets - 10 reps

## 2021-04-21 NOTE — Therapy (Signed)
[facility] Physical Therapy [address] [place], [place], [address] Phone: [phone]   Fax:  [phone]  Physical Therapy Treatment  Patient Details  Name: Brandi Parker MRN: [id number] Date of Birth: 1956-08-21 Referring Provider (PT): Dr. Durward Fortes   Encounter Date: 04/21/2021   PT End of Session - 04/21/21 0955    Visit Number 10    Number of Visits 12    Date for PT Re-Evaluation 04/17/21    Progress Note Due on Visit 10    PT Start Time 0934    PT Stop Time 1014    PT Time Calculation (min) 40 min    Activity Tolerance Patient tolerated treatment well    Behavior During Therapy [facility] for tasks assessed/performed           Past Medical History:  Diagnosis Date  . ADHD (attention deficit hyperactivity disorder)   . Allergy   . Anemia   . Arthritis   . Brain injury ([place]) 1981   fell off a desk chair  . Chicken pox   . Depression   . Fracture, ankle   . Fracture, toe   . GERD (gastroesophageal reflux disease)   . Menopausal syndrome   . Migraines   . Nephrolithiasis   . Osteopenia 01/24/09   dexa scan  . Sleep apnea    uses cpap 08/2020  . Sleep apnea syndrome    could not tolerate CPAP  . Urinary incontinence     Past Surgical History:  Procedure Laterality Date  . CATARACT EXTRACTION, BILATERAL  04/2020  . COLONOSCOPY  10/14/09   Dr. Silvano Rusk, result clear repeat in 10 years  . DILATION AND CURETTAGE OF UTERUS    . KNEE ARTHROSCOPY  12-16-11   left knee, per Dr. Durward Fortes   . KNEE ARTHROSCOPY Right 02/22/2018   per Dr. Dorna Leitz , 2 Left arthroscopy-2015&2016  . REPAIR NONUNION / MALUNION METATARSAL / TARSAL BONES     right 5th  . TONSILLECTOMY      There were no vitals filed for this visit.   Subjective Assessment - 04/21/21 0941    Subjective Pt arriving today reporting pain in right LE in posterior hamstring and glutes. Pt reporting her left hip bone is still sore due to fall last week. Pt reporting the long axis  distraction seemed to help.    Pertinent History osteopenia, GERD, depression, ADHD.  Pt. gained about 30 lbs or so in last year.    Limitations Standing;Walking;Sitting    How long can you walk comfortably? limited sometimes from car to house (household distance)    Patient Stated Goals Reduce pain, sleep better    Currently in Pain? Yes    Pain Score 5     Pain Location Back    Pain Orientation Lower    Pain Descriptors / Indicators Aching    Pain Onset More than a month ago                             [facility] Adult PT Treatment/Exercise - 04/21/21 0001      Lumbar Exercises: Stretches   Other Lumbar Stretch Exercise QL door way stretch    Other Lumbar Stretch Exercise child's pose x 4 holding 20 seconds, forward and then repeated to each side x 2 holding 10 seconds      Lumbar Exercises: Aerobic   Nustep L 6 x 7 minutes      Lumbar Exercises:  Standing   Other Standing Lumbar Exercises lumbar extension elbows against wall x 5 holding 10 seconds, no change in symptoms which extend into pt's posterior thigh      Lumbar Exercises: Supine   Other Supine Lumbar Exercises sciatic nerve flossing (knee extension with pf, knee flexion with df) x 10 on right LE      Lumbar Exercises: Prone   Other Prone Lumbar Exercises prone on elbows x 10 holding 5 seconds each      Manual Therapy   Manual therapy comments long axis distraction x 3 holding 30 seconds to right LE    Joint Mobilization grade 2-3 PA joint mobs to L2-L5    Soft tissue mobilization IASTM with active trigger point release and soft tissue mobilizaitons to right QL and gluteals and piriformis                  PT Education - 04/21/21 0954    Education Details discussed gym equipment to start working on at the [employer]) Educated Patient    Methods Explanation;Demonstration    Comprehension Verbalized understanding            PT Short Term Goals - 04/06/21 1624      PT SHORT TERM GOAL #1    Title Patient will demonstrate independent use of home exercise program to maintain progress from in clinic treatments.    Status Achieved             PT Long Term Goals - 04/06/21 1624      PT LONG TERM GOAL #1   Title Patient will demonstrate/report pain at worst less than or equal to 2/10 to facilitate minimal limitation in daily activity secondary to pain symptoms.    Status On-going      PT LONG TERM GOAL #2   Title Patient will demonstrate independent use of home exercise program to facilitate ability to maintain/progress functional gains from skilled physical therapy services.    Status On-going      PT LONG TERM GOAL #3   Title Patient will demonstrate lumbar extension 100 % WFL s symptoms to facilitate upright standing, walking posture at PLOF s limitation.    Status On-going      PT LONG TERM GOAL #4   Title Pt. will demonstrate FOTO outcome > = 55 to indicated reduced disability due to condition.    Status On-going      PT LONG TERM GOAL #5   Title Pt. will demonstrate Rt LE MMT 5/5 throughout to facilitate usual standing, walking, squat/transfers at PLOF s limitation    Status On-going      PT LONG TERM GOAL #6   Title Pt. will demonstrate equal passive SLR, slump testing Rt to Lt without symptoms to facilitate normal mobility at PLOF.    Status On-going                 Plan - 04/21/21 8366    Clinical Impression Statement Pt tolerating exercises well, Long axis distraction performed again with some relief toward end of session. Pt was educated in exercise equipment to begin working at [employer]. Continue skilled PT.    Personal Factors and Comorbidities Comorbidity 3+;Time since onset of injury/illness/exacerbation    Comorbidities osteopenia, GERD, depression, ADHD    Examination-Activity Limitations Sit;Sleep;Squat;Bend;Caring for Others;Stairs;Carry;Stand;Dressing;Hygiene/Grooming;Lift;Locomotion Level;Transfers    Examination-Participation Restrictions  Community Activity;Shop;Driving;Laundry;Cleaning    Stability/Clinical Decision Making Evolving/Moderate complexity    PT Duration 8 weeks  PT Treatment/Interventions ADLs/Self Care Home Management;Cryotherapy;Electrical Stimulation;Iontophoresis 4mg /ml Dexamethasone;Moist Heat;Traction;Balance training;Therapeutic exercise;Therapeutic activities;Functional mobility training;Stair training;Gait training;DME Instruction;Ultrasound;Neuromuscular re-education;Patient/family education;Manual techniques;Taping;Passive range of motion;Dry needling;Joint Manipulations;Spinal Manipulations    PT Next Visit Plan Review gym equipment, Reassessment for possible HEP transitioning,   long axis distraction to right LE, spinal mobs, QL strething in doorway and in child pose position    PT [place] and Agree with Plan of Care Patient           Patient will benefit from skilled therapeutic intervention in order to improve the following deficits and impairments:  Abnormal gait,Decreased endurance,Hypomobility,Increased edema,Decreased activity tolerance,Decreased strength,Increased fascial restricitons,Pain,Increased muscle spasms,Difficulty walking,Decreased mobility,Decreased balance,Decreased range of motion,Impaired perceived functional ability,Improper body mechanics,Postural dysfunction,Impaired flexibility,Decreased coordination  Visit Diagnosis: Chronic right-sided low back pain with right-sided sciatica  Pain in right leg  Difficulty in walking, not elsewhere classified  Muscle weakness (generalized)  Abnormal posture     Problem List Patient Active Problem List   Diagnosis Date Noted  . Tremors of nervous system 02/18/2021  . Primary hypertension 02/18/2021  . Morbid obesity ([place]) 02/18/2021  . Depression with anxiety 02/18/2021  . Low back pain 11/10/2020  . Unilateral primary osteoarthritis, left knee 01/09/2019  . Left knee pain 01/09/2019  . TMJ  arthropathy 11/08/2016  . Complex sleep apnea syndrome 04/15/2015  . ANEMIA-NOS 05/16/2009  . Attention deficit hyperactivity disorder (ADHD) 05/16/2009  . ALLERGIC RHINITIS 05/16/2009  . URINARY INCONTINENCE 05/16/2009  . NEPHROLITHIASIS, HX OF 05/16/2009    Oretha Caprice, PT, MPT 04/21/2021, 10:19 AM  [facility] Physical Therapy [address] [place], [place], [address] Phone: [phone]   Fax:  [phone]  Name: MACKENZEE BECVAR MRN: [id number] Date of Birth: 04-Mar-1956

## 2021-04-21 NOTE — Progress Notes (Signed)
[facility]  Chief Complaint  Patient presents with  . Follow-up    Cpap broke back in November 2021, patient been using bipap machine in its place    Constitutional:  BP 124/70 (BP Location: Right Arm, Cuff Size: Normal)   Pulse 76   Temp 97.6 F (36.4 C) (Temporal)   Ht 5\' 4"  (1.626 m)   Wt 188 lb 3.2 oz (85.4 kg)   SpO2 98% Comment: Room air  BMI 32.30 kg/m   Past Medical History:  ADHD, Allergies, Anemia, Depression, GERD, Migraine HA, Nephrolithiasis, Osteopenia  Past Surgical History:  She  has a past surgical history that includes Tonsillectomy; Dilation and curettage of uterus; Repair nonunion / malunion metatarsal / tarsal bones; Colonoscopy (10/14/09); Knee arthroscopy (12-16-11); Knee arthroscopy (Right, 02/22/2018); and Cataract extraction, bilateral (04/2020).  Brief Summary:  Brandi Parker is a 65 y.o. female with obstructive sleep apnea.      Subjective:   I last saw her in 2019.  She has a CPAP machine, but this broke several months ago.  She has a back up Bipap machine, and has been using this.  She had her DME set the Bipap to IPAP and EPAP at 9 cm H2O (her CPAP setting).  No issues with pressure feeling.  She has high deductible plan and so purchases supplies on her own.  She will be switching to medicare and state insurance plan in October after she turns 65.  Physical Exam:   Appearance - well kempt   ENMT - no sinus tenderness, no oral exudate, no LAN, Mallampati 3 airway, no stridor  Respiratory - equal breath sounds bilaterally, no wheezing or rales  CV - s1s2 regular rate and rhythm, no murmurs  Ext - no clubbing, no edema  Skin - no rashes  Psych - normal mood and affect   Sleep Tests:   PSG 09/08/15 >> AHI 40.6, SpO2 low 88%, CAI 15.3, CPAP 9 cm H2O >> AHI 2.5  CPAP 05/13/17 to 06/11/17 >> used on 21 of 30 nights with average 5 hrs 34 min.  Average AHI 1.7 with CPAP 9 cm H2O  Social  History:  She  reports that she has never smoked. She has never used smokeless tobacco. She reports current alcohol use. She reports that she does not use drugs.  Family History:  Her family history includes Cancer in her father; Diabetes in her mother; Hashimoto's thyroiditis in her daughter and another family member; Hyperlipidemia in her mother; Hypertension in her mother; Prostate cancer in her father; Stroke in her mother.     Assessment/Plan:   Obstructive sleep apnea. - she has history of TMJ - she is compliant with therapy and reports benefit from therapy - uses [employer] for her DME - her current CPAP is broke and can't be repaired; she would like to wait until she switches insurance before getting a new machine - she is using her Bipap at 9/9 cm H2O - she purchases her supplies on line  Time Spent Involved in Patient Care on Day of Examination:  24 minutes  Follow up:  Patient Instructions  Follow up in 5 months   Medication List:   Allergies as of 04/21/2021      Reactions   Coconut Flavor Swelling   coconut   Penicillins Swelling   REACTION: anaphylaxis REACTION: anaphylaxis   Sulfamethoxazole-trimethoprim Swelling   REACTION: rash REACTION: rash   Strawberry Flavor    All berries  Sulfa Antibiotics    Other reaction(s): Unknown      Medication List       Accurate as of Apr 21, 2021 12:54 PM. If you have any questions, ask your nurse or doctor.        acyclovir ointment 5 % Commonly known as: Zovirax Apply 1 application topically every 3 (three) hours.   Auvi-Q 0.3 mg/0.3 mL Soaj injection Generic drug: EPINEPHrine Inject into the muscle as directed.   calcium carbonate 1500 (600 Ca) MG Tabs tablet Commonly known as: OSCAL Take by mouth daily.   cetirizine 10 MG tablet Commonly known as: ZYRTEC Take 10 mg by mouth daily.   eletriptan 40 MG tablet Commonly known as: RELPAX Take 1 tablet (40 mg total) by mouth as needed for migraine. may  repeat in 2 hours if necessary   escitalopram 20 MG tablet Commonly known as: Lexapro Take 1 tablet (20 mg total) by mouth daily.   estradiol 0.025 MG/24HR Commonly known as: VIVELLE-DOT Place 1 patch onto the skin once a week.   gabapentin 300 MG capsule Commonly known as: NEURONTIN Take 1 capsule (300 mg total) by mouth 3 (three) times daily. TAKE 1 CAPSULE BY MOUTH AT NIGHT FOR 7 NIGHTS, THEN 1 CAPSULE IN THE MORNING, 1 CAPSULE AT NIGHT FOR 7 DAYS, THEN 1 CAPSULE THREE TIMES DAILY   methocarbamol 500 MG tablet Commonly known as: ROBAXIN TAKE 1 TABLET BY MOUTH TWICE DAILY AS NEEDED   methylphenidate 36 MG CR tablet Commonly known as: Concerta Take 1 tablet (36 mg total) by mouth daily. Start taking on: June 08, 2021   metoprolol succinate 50 MG 24 hr tablet Commonly known as: TOPROL-XL Take 1 tablet (50 mg total) by mouth daily. Take with or immediately following a meal.   Naproxen Sodium 220 MG Caps Take 2 capsules by mouth daily.   omeprazole 20 MG capsule Commonly known as: PRILOSEC Take 1 capsule (20 mg total) by mouth daily.   progesterone 100 MG capsule Commonly known as: PROMETRIUM Take 100 mg by mouth daily.   traMADol 50 MG tablet Commonly known as: ULTRAM Take 1 tablet (50 mg total) by mouth every 6 (six) hours as needed.   valACYclovir 1000 MG tablet Commonly known as: VALTREX Take 1 tablet (1,000 mg total) by mouth as needed.   Vitamin D 50 MCG (2000 UT) tablet Take 2,000 Units by mouth daily.   WOMENS 50+ ADVANCED PO Take by mouth.       Signature:  Chesley Mires, MD [place] Pager - [phone] 04/21/2021, 12:54 PM

## 2021-04-28 ENCOUNTER — Ambulatory Visit: Payer: BC Managed Care – PPO | Admitting: Physical Therapy

## 2021-04-28 ENCOUNTER — Other Ambulatory Visit: Payer: Self-pay

## 2021-04-28 ENCOUNTER — Encounter: Payer: Self-pay | Admitting: Physical Therapy

## 2021-04-28 DIAGNOSIS — R293 Abnormal posture: Secondary | ICD-10-CM

## 2021-04-28 DIAGNOSIS — M79604 Pain in right leg: Secondary | ICD-10-CM

## 2021-04-28 DIAGNOSIS — M5441 Lumbago with sciatica, right side: Secondary | ICD-10-CM

## 2021-04-28 DIAGNOSIS — R262 Difficulty in walking, not elsewhere classified: Secondary | ICD-10-CM

## 2021-04-28 DIAGNOSIS — G8929 Other chronic pain: Secondary | ICD-10-CM

## 2021-04-28 DIAGNOSIS — M6281 Muscle weakness (generalized): Secondary | ICD-10-CM | POA: Diagnosis not present

## 2021-04-28 NOTE — Therapy (Signed)
[facility] Physical Therapy [address] [place], [place], [address] Phone: [phone]   Fax:  [phone]  Physical Therapy Treatment Recertification  Patient Details  Name: Brandi Parker MRN: [id number] Date of Birth: 1956/08/28 Referring Provider (PT): Dr. Durward Fortes   Encounter Date: 04/28/2021   PT End of Session - 04/28/21 0953    Visit Number 11    Number of Visits 16    Date for PT Re-Evaluation 06/18/21    Authorization Type additional visits applied for from 04/21/2021 to 06/18/2021    Progress Note Due on Visit 16    PT Start Time 0930    PT Stop Time 1015    PT Time Calculation (min) 45 min    Activity Tolerance Patient tolerated treatment well    Behavior During Therapy [facility] for tasks assessed/performed           Past Medical History:  Diagnosis Date  . ADHD (attention deficit hyperactivity disorder)   . Allergy   . Anemia   . Arthritis   . Brain injury ([place]) 1981   fell off a desk chair  . Chicken pox   . Depression   . Fracture, ankle   . Fracture, toe   . GERD (gastroesophageal reflux disease)   . Menopausal syndrome   . Migraines   . Nephrolithiasis   . Osteopenia 01/24/09   dexa scan  . Sleep apnea    uses cpap 08/2020  . Sleep apnea syndrome    could not tolerate CPAP  . Urinary incontinence     Past Surgical History:  Procedure Laterality Date  . CATARACT EXTRACTION, BILATERAL  04/2020  . COLONOSCOPY  10/14/09   Dr. Silvano Rusk, result clear repeat in 10 years  . DILATION AND CURETTAGE OF UTERUS    . KNEE ARTHROSCOPY  12-16-11   left knee, per Dr. Durward Fortes   . KNEE ARTHROSCOPY Right 02/22/2018   per Dr. Dorna Leitz , 2 Left arthroscopy-2015&2016  . REPAIR NONUNION / MALUNION METATARSAL / TARSAL BONES     right 5th  . TONSILLECTOMY      There were no vitals filed for this visit.   Subjective Assessment - 04/28/21 0950    Subjective Pt arriving to therapy reporting 2-3/10 pain in posterior right hamstring and  lateral  right hip.    Pertinent History osteopenia, GERD, depression, ADHD.  Pt. gained about 30 lbs or so in last year.    How long can you walk comfortably? limited sometimes from car to house (household distance)    Patient Stated Goals Reduce pain, sleep better    Currently in Pain? Yes    Pain Score 3     Pain Location Back    Pain Orientation Lower    Pain Descriptors / Indicators Sore    Pain Type Chronic pain    Pain Onset More than a month ago    Pain Frequency Intermittent                             [facility] Adult PT Treatment/Exercise - 04/28/21 0001      Lumbar Exercises: Stretches   Piriformis Stretch 3 reps;Right;Left;20 seconds    Other Lumbar Stretch Exercise QL door way stretch    Other Lumbar Stretch Exercise child's pose x 4 holding 20 seconds, forward and then repeated to each side x 2 holding 10 seconds      Lumbar Exercises: Aerobic   Nustep --  Lumbar Exercises: Standing   Other Standing Lumbar Exercises lumbar extension x 5 holding 10 seconds      Lumbar Exercises: Supine   Other Supine Lumbar Exercises sciatic nerve flossing (knee extension with pf, knee flexion with df) x 10 on right LE      Lumbar Exercises: Prone   Straight Leg Raise 10 reps;3 seconds      Modalities   Modalities Traction      Traction   Type of Traction Lumbar    Min (lbs) 60    Max (lbs) 85    Hold Time lumbar protocol    Time 15 minutes      Manual Therapy   Manual therapy comments --    Joint Mobilization --    Soft tissue mobilization STM: R lateral hip, IT band   5 minutes                   PT Short Term Goals - 04/06/21 1624      PT SHORT TERM GOAL #1   Title Patient will demonstrate independent use of home exercise program to maintain progress from in clinic treatments.    Status Achieved             PT Long Term Goals - 04/28/21 1611      PT LONG TERM GOAL #1   Title Patient will demonstrate/report pain at worst less  than or equal to 2/10 to facilitate minimal limitation in daily activity secondary to pain symptoms.    Status On-going      PT LONG TERM GOAL #2   Title Patient will demonstrate independent use of home exercise program to facilitate ability to maintain/progress functional gains from skilled physical therapy services.    Status On-going      PT LONG TERM GOAL #3   Title Patient will demonstrate lumbar extension 100 % WFL s symptoms to facilitate upright standing, walking posture at PLOF s limitation.    Status On-going      PT LONG TERM GOAL #4   Title Pt. will demonstrate FOTO outcome > = 55 to indicated reduced disability due to condition.    Status On-going      PT LONG TERM GOAL #5   Title Pt. will demonstrate Rt LE MMT 5/5 throughout to facilitate usual standing, walking, squat/transfers at PLOF s limitation    Status On-going      PT LONG TERM GOAL #6   Title Pt. will demonstrate equal passive SLR, slump testing Rt to Lt without symptoms to facilitate normal mobility at PLOF.    Status On-going                 Plan - 04/28/21 1013    Clinical Impression Statement Pt tolerating exercises well. Pt still reporting pain down right sided lower back into posterior hamstring. Pt having difficulty with prone hip extension and pain in right lateral hip, IT band insertion. Pt has been edu in sleeping position which has helped some when lying on left side, but pt can't tolerate lying on right side. Mechanical traction tried today with normal response. Next visit asssess response to traction and repeat if applicable. Pt is still progressing toward goals set. I am recommending additional 6 visits (1x/week) to continue skilled PT.    Personal Factors and Comorbidities Comorbidity 3+;Time since onset of injury/illness/exacerbation    Comorbidities osteopenia, GERD, depression, ADHD    Examination-Activity Limitations Sit;Sleep;Squat;Bend;Caring for  Others;Stairs;Carry;Stand;Dressing;Hygiene/Grooming;Lift;Locomotion Level;Transfers    Examination-Participation Restrictions  Community Activity;Shop;Driving;Laundry;Cleaning    Stability/Clinical Decision Making Evolving/Moderate complexity    PT Duration 8 weeks    PT Treatment/Interventions ADLs/Self Care Home Management;Cryotherapy;Electrical Stimulation;Iontophoresis 4mg /ml Dexamethasone;Moist Heat;Traction;Balance training;Therapeutic exercise;Therapeutic activities;Functional mobility training;Stair training;Gait training;DME Instruction;Ultrasound;Neuromuscular re-education;Patient/family education;Manual techniques;Taping;Passive range of motion;Dry needling;Joint Manipulations;Spinal Manipulations    PT Next Visit Plan assess response to traction, long axis distraction to right LE, spinal mobs, QL strething in doorway and in child pose position    PT Home Exercise Plan [email or web address]    Consulted and Agree with Plan of Care Patient           Patient will benefit from skilled therapeutic intervention in order to improve the following deficits and impairments:  Abnormal gait,Decreased endurance,Hypomobility,Increased edema,Decreased activity tolerance,Decreased strength,Increased fascial restricitons,Pain,Increased muscle spasms,Difficulty walking,Decreased mobility,Decreased balance,Decreased range of motion,Impaired perceived functional ability,Improper body mechanics,Postural dysfunction,Impaired flexibility,Decreased coordination  Visit Diagnosis: Chronic right-sided low back pain with right-sided sciatica  Pain in right leg  Difficulty in walking, not elsewhere classified  Muscle weakness (generalized)  Abnormal posture     Problem List Patient Active Problem List   Diagnosis Date Noted  . Tremors of nervous system 02/18/2021  . Primary hypertension 02/18/2021  . Morbid obesity ([place]) 02/18/2021  . Depression with anxiety 02/18/2021  . Low back pain 11/10/2020  . Unilateral  primary osteoarthritis, left knee 01/09/2019  . Left knee pain 01/09/2019  . TMJ arthropathy 11/08/2016  . Complex sleep apnea syndrome 04/15/2015  . ANEMIA-NOS 05/16/2009  . Attention deficit hyperactivity disorder (ADHD) 05/16/2009  . ALLERGIC RHINITIS 05/16/2009  . URINARY INCONTINENCE 05/16/2009  . NEPHROLITHIASIS, HX OF 05/16/2009    Oretha Caprice, PT, MPT 04/28/2021, 4:15 PM  [facility] Physical Therapy [address] [place], [place], [address] Phone: [phone]   Fax:  [phone]  Name: ENRIKA AGUADO MRN: [id number] Date of Birth: 09-14-1956

## 2021-05-05 ENCOUNTER — Encounter: Payer: Self-pay | Admitting: Physical Therapy

## 2021-05-05 ENCOUNTER — Ambulatory Visit: Payer: BC Managed Care – PPO | Admitting: Physical Therapy

## 2021-05-05 ENCOUNTER — Other Ambulatory Visit: Payer: Self-pay

## 2021-05-05 DIAGNOSIS — M79604 Pain in right leg: Secondary | ICD-10-CM | POA: Diagnosis not present

## 2021-05-05 DIAGNOSIS — M6281 Muscle weakness (generalized): Secondary | ICD-10-CM

## 2021-05-05 DIAGNOSIS — M5441 Lumbago with sciatica, right side: Secondary | ICD-10-CM

## 2021-05-05 DIAGNOSIS — G8929 Other chronic pain: Secondary | ICD-10-CM

## 2021-05-05 DIAGNOSIS — R293 Abnormal posture: Secondary | ICD-10-CM

## 2021-05-05 DIAGNOSIS — R262 Difficulty in walking, not elsewhere classified: Secondary | ICD-10-CM | POA: Diagnosis not present

## 2021-05-05 NOTE — Therapy (Addendum)
[facility] Physical Therapy [address] [place], [place], [address] Phone: [phone]   Fax:  [phone]  Physical Therapy Treatment/Discharge  Patient Details  Name: Brandi Parker MRN: [id number] Date of Birth: 04/18/1956 Referring Provider (PT): Dr. Whitfield   Encounter Date: 05/05/2021   PT End of Session - 05/05/21 0947     Visit Number 12    Number of Visits 16    Date for PT Re-Evaluation 06/18/21    Authorization Type additional visits applied for from 04/21/2021 to 06/18/2021    Progress Note Due on Visit 16    PT Start Time 0934    PT Stop Time 1015    PT Time Calculation (min) 41 min    Activity Tolerance Patient tolerated treatment well    Behavior During Therapy WFL for tasks assessed/performed             Past Medical History:  Diagnosis Date   ADHD (attention deficit hyperactivity disorder)    Allergy    Anemia    Arthritis    Brain injury ([facility]) 1981   fell off a desk chair   Chicken pox    Depression    Fracture, ankle    Fracture, toe    GERD (gastroesophageal reflux disease)    Menopausal syndrome    Migraines    Nephrolithiasis    Osteopenia 01/24/09   dexa scan   Sleep apnea    uses cpap 08/2020   Sleep apnea syndrome    could not tolerate CPAP   Urinary incontinence     Past Surgical History:  Procedure Laterality Date   CATARACT EXTRACTION, BILATERAL  04/2020   COLONOSCOPY  10/14/09   Dr. Carl Gessner, result clear repeat in 10 years   DILATION AND CURETTAGE OF UTERUS     KNEE ARTHROSCOPY  12-16-11   left knee, per Dr. Whitfield    KNEE ARTHROSCOPY Right 02/22/2018   per Dr. John Graves , 2 Left arthroscopy-2015&2016   REPAIR NONUNION / MALUNION METATARSAL / TARSAL BONES     right 5th   TONSILLECTOMY      There were no vitals filed for this visit.   Subjective Assessment - 05/05/21 0942     Subjective Pt arriving to therapy reporting 24 hour episode of pain following her last visit with mechanical  traction. Pt reporting her knees were hurting the next day. Pt reporting pin point piriformis pain on the right side with pain raidating down R LE into posterior knee.    Pertinent History osteopenia, GERD, depression, ADHD.  Pt. gained about 30 lbs or so in last year.    Limitations Standing;Walking;Sitting    How long can you walk comfortably? limited sometimes from car to house (household distance)    Patient Stated Goals Reduce pain, sleep better    Currently in Pain? Yes    Pain Score 7     Pain Location Buttocks    Pain Orientation Right    Pain Descriptors / Indicators Sore    Pain Type Chronic pain    Pain Onset More than a month ago    Pain Frequency Intermittent                [facility] PT Assessment - 05/05/21 0001       Assessment   Medical Diagnosis Chronic Rt sided low back pain, rt sciatica    Referring Provider (PT) Dr. Whitfield    Onset Date/Surgical Date 09/19/20    Hand Dominance Right        Observation/Other Assessments   Focus on Therapeutic Outcomes (FOTO)  21                           [facility] Adult PT Treatment/Exercise - 05/05/21 0001       Lumbar Exercises: Stretches   Piriformis Stretch 3 reps;Right;Left;20 seconds    Piriformis Stretch Limitations gluteal stretch x 2 holding 20 seconds    Other Lumbar Stretch Exercise QL door way stretch      Lumbar Exercises: Aerobic   Nustep L6 x 10 minutes      Lumbar Exercises: Standing   Other Standing Lumbar Exercises lumbar extension x 5 holding 10 seconds    Other Standing Lumbar Exercises lateral shifting to left increased symptoms down R LE, Pt with increased tightness noted when shifting hips to left, standing hip extension x 15 each LE, standing marching x 15      Lumbar Exercises: Supine   Other Supine Lumbar Exercises sciatic nerve flossing (knee extension with pf, knee flexion with df) x 10 on right LE      Manual Therapy   Manual therapy comments 10 minutes    Soft tissue  mobilization IASTM: R piriformis, IT band, hamstrings, active trigger point release of piriformis                      PT Short Term Goals - 05/05/21 1020       PT SHORT TERM GOAL #1   Title Patient will demonstrate independent use of home exercise program to maintain progress from in clinic treatments.    Period Weeks    Status Achieved    Target Date 03/13/21               PT Long Term Goals - 05/05/21 1030       PT LONG TERM GOAL #1   Title Patient will demonstrate/report pain at worst less than or equal to 2/10 to facilitate minimal limitation in daily activity secondary to pain symptoms.    Status On-going      PT LONG TERM GOAL #2   Title Patient will demonstrate independent use of home exercise program to facilitate ability to maintain/progress functional gains from skilled physical therapy services.    Status On-going      PT LONG TERM GOAL #3   Title Patient will demonstrate lumbar extension 100 % WFL s symptoms to facilitate upright standing, walking posture at PLOF s limitation.      PT LONG TERM GOAL #4   Title Pt. will demonstrate FOTO outcome > = 55 to indicated reduced disability due to condition.    Baseline Foto on 05/05/2021 was 45    Time 8    Period Weeks    Status On-going      PT LONG TERM GOAL #5   Title Pt. will demonstrate Rt LE MMT 5/5 throughout to facilitate usual standing, walking, squat/transfers at PLOF s limitation    Status On-going      PT LONG TERM GOAL #6   Title Pt. will demonstrate equal passive SLR, slump testing Rt to Lt without symptoms to facilitate normal mobility at PLOF.    Status On-going                   Plan - 05/05/21 1016     Clinical Impression Statement Pt arriving reporting pain following her last session with traction. We discussed possibility of trying traction again  at a future visit with change in LE positioning and max pull. Today pt tolerated treatment well. Less pain reported after  IASTM and moist heat at end of session. Pt still presenting with tightness in right LE and pain in rigtht piriformis and down posterior hamstrings. Pt's FOTO has increased to 45%. Continue skilled PT to mazimize function.    Personal Factors and Comorbidities Comorbidity 3+;Time since onset of injury/illness/exacerbation    Comorbidities osteopenia, GERD, depression, ADHD    Examination-Activity Limitations Sit;Sleep;Squat;Bend;Caring for Others;Stairs;Carry;Stand;Dressing;Hygiene/Grooming;Lift;Locomotion Level;Transfers    Examination-Participation Restrictions Community Activity;Shop;Driving;Laundry;Cleaning    Stability/Clinical Decision Making Evolving/Moderate complexity    PT Duration 8 weeks    PT Treatment/Interventions ADLs/Self Care Home Management;Cryotherapy;Electrical Stimulation;Iontophoresis 47m/ml Dexamethasone;Moist Heat;Traction;Balance training;Therapeutic exercise;Therapeutic activities;Functional mobility training;Stair training;Gait training;DME Instruction;Ultrasound;Neuromuscular re-education;Patient/family education;Manual techniques;Taping;Passive range of motion;Dry needling;Joint Manipulations;Spinal Manipulations    PT Next Visit Plan long axis distraction to right LE, spinal mobs, QL strething in doorway and in child pose position    PT [place] Agree with Plan of Care Patient             Patient will benefit from skilled therapeutic intervention in order to improve the following deficits and impairments:  Abnormal gait,Decreased endurance,Hypomobility,Increased edema,Decreased activity tolerance,Decreased strength,Increased fascial restricitons,Pain,Increased muscle spasms,Difficulty walking,Decreased mobility,Decreased balance,Decreased range of motion,Impaired perceived functional ability,Improper body mechanics,Postural dysfunction,Impaired flexibility,Decreased coordination  Visit Diagnosis: Chronic right-sided low back pain  with right-sided sciatica  Pain in right leg  Difficulty in walking, not elsewhere classified  Muscle weakness (generalized)  Abnormal posture     Problem List Patient Active Problem List   Diagnosis Date Noted   Tremors of nervous system 02/18/2021   Primary hypertension 02/18/2021   Morbid obesity ([place] 02/18/2021   Depression with anxiety 02/18/2021   Low back pain 11/10/2020   Unilateral primary osteoarthritis, left knee 01/09/2019   Left knee pain 01/09/2019   TMJ arthropathy 11/08/2016   Complex sleep apnea syndrome 04/15/2015   ANEMIA-NOS 05/16/2009   Attention deficit hyperactivity disorder (ADHD) 05/16/2009   ALLERGIC RHINITIS 05/16/2009   URINARY INCONTINENCE 05/16/2009   NEPHROLITHIASIS, HX OF 05/16/2009    JOretha Caprice PT, MPT 05/05/2021, 10:37 AM  PHYSICAL THERAPY DISCHARGE SUMMARY  Visits from Start of Care: 12  Current functional level related to goals / functional outcomes: See note   Remaining deficits: See note   Education / Equipment:   HEP Patient agrees to discharge. Patient goals were partially met. Patient is being discharged due to not returning since the last visit.  MScot Jun PT, DPT, OCS, ATC 07/14/21  10:26 AM    [facility] [address] [address]Phone: [phone]  Fax:  [phone] Name: Brandi COLLINGSMRN: [id number] of Birth: [birth year]

## 2021-05-30 ENCOUNTER — Other Ambulatory Visit: Payer: Self-pay | Admitting: Physical Medicine and Rehabilitation

## 2021-05-30 ENCOUNTER — Other Ambulatory Visit: Payer: Self-pay | Admitting: Orthopaedic Surgery

## 2021-05-30 DIAGNOSIS — M792 Neuralgia and neuritis, unspecified: Secondary | ICD-10-CM

## 2021-06-01 NOTE — Telephone Encounter (Signed)
thanks

## 2021-06-01 NOTE — Telephone Encounter (Signed)
Ok to renew methocarbamol

## 2021-06-01 NOTE — Telephone Encounter (Signed)
Patient states that she is taking this TID.

## 2021-06-01 NOTE — Telephone Encounter (Signed)
Please advise 

## 2021-06-08 ENCOUNTER — Other Ambulatory Visit: Payer: Self-pay | Admitting: Family Medicine

## 2021-07-01 ENCOUNTER — Other Ambulatory Visit: Payer: Self-pay | Admitting: Family Medicine

## 2021-08-07 ENCOUNTER — Telehealth: Payer: Self-pay

## 2021-08-07 NOTE — Telephone Encounter (Signed)
Called requestiing refill onmethylphenidate (CONCERTA) 36 MG PO CR tablet (Expired)

## 2021-08-12 NOTE — Telephone Encounter (Signed)
Last refill- 06/08/21,  30 capules, no refills Last office visit-04/13/21  No future office visit scheduled

## 2021-08-13 MED ORDER — METHYLPHENIDATE HCL ER (OSM) 36 MG PO TBCR: 36.0000 mg | EXTENDED_RELEASE_TABLET | Freq: Every day | ORAL | 0 refills | Status: DC

## 2021-08-13 NOTE — Telephone Encounter (Signed)
Done

## 2021-08-13 NOTE — Addendum Note (Signed)
Addended by: Alysia Penna A on: 08/13/2021 12:01 PM   Modules accepted: Orders

## 2021-08-13 NOTE — Telephone Encounter (Signed)
Pt was notified on MyChart

## 2021-09-02 ENCOUNTER — Other Ambulatory Visit: Payer: Self-pay | Admitting: Physical Medicine and Rehabilitation

## 2021-09-02 DIAGNOSIS — M792 Neuralgia and neuritis, unspecified: Secondary | ICD-10-CM

## 2021-09-02 NOTE — Telephone Encounter (Signed)
Please advise 

## 2021-09-08 ENCOUNTER — Telehealth: Payer: Self-pay | Admitting: Family Medicine

## 2021-09-08 ENCOUNTER — Other Ambulatory Visit: Payer: Self-pay | Admitting: Family Medicine

## 2021-09-08 NOTE — Telephone Encounter (Signed)
Please advise 

## 2021-09-08 NOTE — Telephone Encounter (Signed)
Patient wants a 90 day refill on her Methylphenidate.  [facility] on [address]

## 2021-09-09 NOTE — Telephone Encounter (Signed)
She already has refills until Nov. 24

## 2021-09-09 NOTE — Telephone Encounter (Signed)
Spoke with patient about message, she will contact pharmacy to fill.

## 2021-09-14 ENCOUNTER — Encounter: Payer: BC Managed Care – PPO | Admitting: Family Medicine

## 2021-09-14 ENCOUNTER — Telehealth: Payer: Self-pay

## 2021-09-14 ENCOUNTER — Other Ambulatory Visit: Payer: Self-pay

## 2021-09-14 NOTE — Telephone Encounter (Signed)
Message sent to dr Sarajane Jews for advise

## 2021-09-15 ENCOUNTER — Encounter: Payer: Self-pay | Admitting: Family Medicine

## 2021-09-15 ENCOUNTER — Other Ambulatory Visit (INDEPENDENT_AMBULATORY_CARE_PROVIDER_SITE_OTHER): Payer: BC Managed Care – PPO

## 2021-09-15 ENCOUNTER — Ambulatory Visit (INDEPENDENT_AMBULATORY_CARE_PROVIDER_SITE_OTHER): Payer: BC Managed Care – PPO | Admitting: Family Medicine

## 2021-09-15 VITALS — BP 140/82 | HR 76 | Temp 98.9°F | Ht 64.0 in | Wt 197.1 lb

## 2021-09-15 DIAGNOSIS — N3281 Overactive bladder: Secondary | ICD-10-CM | POA: Diagnosis not present

## 2021-09-15 DIAGNOSIS — Z0000 Encounter for general adult medical examination without abnormal findings: Secondary | ICD-10-CM

## 2021-09-15 DIAGNOSIS — Z23 Encounter for immunization: Secondary | ICD-10-CM

## 2021-09-15 LAB — BASIC METABOLIC PANEL
BUN: 28 mg/dL — ABNORMAL HIGH (ref 6–23)
CO2: 28 mEq/L (ref 19–32)
Calcium: 9 mg/dL (ref 8.4–10.5)
Chloride: 109 mEq/L (ref 96–112)
Creatinine, Ser: 0.91 mg/dL (ref 0.40–1.20)
GFR: 66.45 mL/min (ref >60.00)
Glucose, Bld: 106 mg/dL — ABNORMAL HIGH (ref 70–99)
Potassium: 4.3 mEq/L (ref 3.5–5.1)
Sodium: 144 mEq/L (ref 135–145)

## 2021-09-15 LAB — HEPATIC FUNCTION PANEL
ALT: 15 U/L (ref 0–35)
AST: 19 U/L (ref 0–37)
Albumin: 4 g/dL (ref 3.5–5.2)
Alkaline Phosphatase: 93 U/L (ref 39–117)
Bilirubin, Direct: 0.1 mg/dL (ref 0.0–0.3)
Total Bilirubin: 0.2 mg/dL (ref 0.2–1.2)
Total Protein: 6.4 g/dL (ref 6.0–8.3)

## 2021-09-15 LAB — HEMOGLOBIN A1C: Hgb A1c MFr Bld: 6 % (ref 4.6–6.5)

## 2021-09-15 LAB — TSH: TSH: 2.32 u[IU]/mL (ref 0.35–5.50)

## 2021-09-15 LAB — LIPID PANEL
Cholesterol: 178 mg/dL (ref 0–200)
HDL: 50.3 mg/dL (ref >39.00)
NonHDL: 127.5
Total CHOL/HDL Ratio: 4
Triglycerides: 207 mg/dL — ABNORMAL HIGH (ref 0.0–149.0)
VLDL: 41.4 mg/dL — ABNORMAL HIGH (ref 0.0–40.0)

## 2021-09-15 LAB — CBC WITH DIFFERENTIAL/PLATELET
Basophils Absolute: 0 10*3/uL (ref 0.0–0.1)
Basophils Relative: 0.6 % (ref 0.0–3.0)
Eosinophils Absolute: 0.4 10*3/uL (ref 0.0–0.7)
Eosinophils Relative: 4.7 % (ref 0.0–5.0)
HCT: 38.6 % (ref 36.0–46.0)
Hemoglobin: 12.2 g/dL (ref 12.0–15.0)
Lymphocytes Relative: 31.9 % (ref 12.0–46.0)
Lymphs Abs: 2.4 10*3/uL (ref 0.7–4.0)
MCHC: 31.7 g/dL (ref 30.0–36.0)
MCV: 80.7 fl (ref 78.0–100.0)
Monocytes Absolute: 0.8 10*3/uL (ref 0.1–1.0)
Monocytes Relative: 9.9 % (ref 3.0–12.0)
Neutro Abs: 4 10*3/uL (ref 1.4–7.7)
Neutrophils Relative %: 52.9 % (ref 43.0–77.0)
Platelets: 265 10*3/uL (ref 150.0–400.0)
RBC: 4.78 Mil/uL (ref 3.87–5.11)
RDW: 15.3 % (ref 11.5–15.5)
WBC: 7.6 10*3/uL (ref 4.0–10.5)

## 2021-09-15 LAB — LDL CHOLESTEROL, DIRECT: Direct LDL: 111 mg/dL

## 2021-09-15 MED ORDER — OXYBUTYNIN CHLORIDE ER 10 MG PO TB24: 10.0000 mg | ORAL_TABLET | Freq: Every day | ORAL | 3 refills | Status: DC

## 2021-09-15 MED ORDER — LOSARTAN POTASSIUM 50 MG PO TABS: 50.0000 mg | ORAL_TABLET | Freq: Every day | ORAL | 2 refills | Status: DC

## 2021-09-15 MED ORDER — PHENTERMINE HCL 37.5 MG PO CAPS: 37.5000 mg | ORAL_CAPSULE | ORAL | 2 refills | Status: DC

## 2021-09-15 NOTE — Progress Notes (Signed)
Subjective:    Patient ID: Brandi Parker, female    DOB: 1956-08-03, 65 y.o.   MRN: [id number]  HPI Here for a well exam. She had a few issues to discuss. First she asks for help with her weight. She says she "eats food for comfort" and she finds it hard to reduce her intake of carbohydrates. She is doing water aerobics 3 days a week for her joint pains, but this does not help with her weight. Also she recently saw Dr. Harold Hedge for her allergies and he thinks she has some reactive airway disease ( he did not call it asthma). He gave her an albuterol inhaler to try, and he said her beta blocker may contribute to this. Lastly for the past year she has had trouble with intense urgency to urinate, and she often cannot get to the bathroom in time to avoid incontinence. She has been wearing protective undergarments.    Review of Systems  Constitutional: Negative.   HENT: Negative.    Eyes: Negative.   Respiratory:  Positive for shortness of breath.   Cardiovascular: Negative.   Gastrointestinal: Negative.   Genitourinary:  Positive for urgency. Negative for decreased urine volume, difficulty urinating, dyspareunia, dysuria, enuresis, flank pain, frequency, hematuria and pelvic pain.  Musculoskeletal: Negative.   Skin: Negative.   Neurological: Negative.  Negative for headaches.  Psychiatric/Behavioral: Negative.        Objective:   Physical Exam Constitutional:      General: She is not in acute distress.    Appearance: She is well-developed. She is obese.  HENT:     Head: Normocephalic and atraumatic.     Right Ear: External ear normal.     Left Ear: External ear normal.     Nose: Nose normal.     Mouth/Throat:     Pharynx: No oropharyngeal exudate.  Eyes:     General: No scleral icterus.    Conjunctiva/sclera: Conjunctivae normal.     Pupils: Pupils are equal, round, and reactive to light.  Neck:     Thyroid: No thyromegaly.     Vascular: No JVD.  Cardiovascular:     Rate  and Rhythm: Normal rate and regular rhythm.     Pulses: Normal pulses.     Heart sounds: Normal heart sounds. No murmur heard.   No friction rub. No gallop.  Pulmonary:     Effort: Pulmonary effort is normal. No respiratory distress.     Breath sounds: Normal breath sounds. No wheezing or rales.  Chest:     Chest wall: No tenderness.  Abdominal:     General: Bowel sounds are normal. There is no distension.     Palpations: Abdomen is soft. There is no mass.     Tenderness: There is no abdominal tenderness. There is no guarding or rebound.  Musculoskeletal:        General: No tenderness. Normal range of motion.     Cervical back: Normal range of motion and neck supple.  Lymphadenopathy:     Cervical: No cervical adenopathy.  Skin:    General: Skin is warm and dry.     Findings: No erythema or rash.  Neurological:     Mental Status: She is alert and oriented to person, place, and time.     Cranial Nerves: No cranial nerve deficit.     Motor: No abnormal muscle tone.     Coordination: Coordination normal.     Deep Tendon Reflexes: Reflexes are normal and  symmetric. Reflexes normal.  Psychiatric:        Behavior: Behavior normal.        Thought Content: Thought content normal.        Judgment: Judgment normal.          Assessment & Plan:  Well exam. We discussed diet and exercise. She had fasting labs this morning. To help her lose weight, she will try Phentermine  37.5 mg daily for 3-6 months. As long as she is taking this, she should avoid taking any Concerta. She understands this. Due to reactive airways she will stop the Metoprolol and start on Losartan 50 mg daily for the HTN. Finally she has OAB and she will try taking Oxybutynin LA 10 mg daily. She will follow up on these issues in 4 weeks. Alysia Penna, MD

## 2021-10-03 ENCOUNTER — Other Ambulatory Visit: Payer: Self-pay | Admitting: Family Medicine

## 2021-10-20 ENCOUNTER — Ambulatory Visit: Payer: Medicare PPO | Admitting: Orthopaedic Surgery

## 2021-10-20 ENCOUNTER — Other Ambulatory Visit: Payer: Self-pay

## 2021-10-20 ENCOUNTER — Encounter: Payer: Self-pay | Admitting: Orthopaedic Surgery

## 2021-10-20 ENCOUNTER — Ambulatory Visit: Payer: Self-pay

## 2021-10-20 ENCOUNTER — Ambulatory Visit: Payer: BC Managed Care – PPO | Admitting: Family Medicine

## 2021-10-20 DIAGNOSIS — M25561 Pain in right knee: Secondary | ICD-10-CM | POA: Diagnosis not present

## 2021-10-20 DIAGNOSIS — M5441 Lumbago with sciatica, right side: Secondary | ICD-10-CM

## 2021-10-20 DIAGNOSIS — M170 Bilateral primary osteoarthritis of knee: Secondary | ICD-10-CM

## 2021-10-20 DIAGNOSIS — G8929 Other chronic pain: Secondary | ICD-10-CM | POA: Diagnosis not present

## 2021-10-20 NOTE — Progress Notes (Signed)
Office Visit Note   Patient: Brandi Parker           Date of Birth: 07-22-1956           MRN: [id number] Visit Date: 10/20/2021              Requested by: Laurey Morale, MD [place],  [place] [address] PCP: Laurey Morale, MD   Assessment & Plan: Visit Diagnoses:  1. Chronic pain of right knee   2. Bilateral primary osteoarthritis of knee   3. Acute right-sided low back pain with right-sided sciatica     Plan: Brandi Parker was seen today for evaluation of right knee pain and to revisit the issue she has had with the lumbar spine.  She had an MRI scan of her lumbar spine performed in December 2021.  There was a central disc protrusion eccentric to the right at L5-S1 that contributed to moderate right subarticular stenosis with crowding of the descending right S1 nerve root.  There was also multifactorial mild left subarticular and central canal narrowing.  At L4-5 there was multifactorial mild to moderate bilateral subarticular narrowing with slight crowding of the left greater than right descending L5 nerve roots.  No more than mild spinal canal stenosis at the remaining levels.  She has been evaluated by Dr. Ernestina Patches on at least 2 occasions since that time but notes that neither of the injections helped.  She does have muscle relaxant, Excedrin and Aleve at home which seems to help.  On occasion she does have back pain with referred discomfort into her right lower extremity.  She has not had any bowel or bladder changes.  Long discussion over least 30 minutes regarding the issues with her back.  I think it is important for her to continue with her exercises.  We have discussed the pluses and minuses of seeing a chiropractor per her suggestion.  I have also offered her an evaluation by one of the spine physicians but she does not want to have spine surgery.  Answered all of her questions and be happy to see her at any time.  She did not want further injections and did not want  to go back to physical therapy.  She will continue with her home exercises.  In terms of her right knee that she does have significant degenerative changes by plain film.  We have discussed cortisone and over-the-counter medicines and exercises.  I have also discussed knee replacement with her in terms of what she can expect with the surgery and even postoperatively.  She needs to make that decision on her own as to whether or not she is ready to have the surgery.  I even offered a cortisone injection today but she was hesitant to have another injection injection.  Follow-Up Instructions: Return if symptoms worsen or fail to improve.   Orders:  Orders Placed This Encounter  Procedures   XR KNEE 3 VIEW RIGHT   No orders of the defined types were placed in this encounter.     Procedures: No procedures performed   Clinical Data: No additional findings.   Subjective: Chief Complaint  Patient presents with   Right Knee - Pain  Patient presents today for right knee pain. She said that it started in July of this year when she was jumping waves at the beach. Most of her pain is located laterally, but also under her patella. She said that it swells, and gives way. She  takes Robaxin, Gabapentin, and Excedrin. She also wants to talk about her chronic lower back pain that has continued to get worse. She said that the pain now travels all the way down to her right ankle/foot area.  No bowel or bladder problems minimal discomfort on the left side  HPI  Review of Systems   Objective: Vital Signs: There were no vitals taken for this visit.  Physical Exam Constitutional:      Appearance: She is well-developed.  Pulmonary:     Effort: Pulmonary effort is normal.  Skin:    General: Skin is warm and dry.  Neurological:     Mental Status: She is alert and oriented to person, place, and time.  Psychiatric:        Behavior: Behavior normal.    Ortho Exam awake alert and oriented x3.   Comfortable sitting.  Straight leg raise negative bilaterally.  Painless range of motion both hips.  Right ankle reflex was slightly less than the left.  Knee reflexes were symmetrical.  Motor exam and sensory exam intact.  No percussible tenderness of lumbar spine.  Right knee with increased varus.  Full knee extension of flexed about 95 degrees without instability.  Mostly medial joint pain.  Possibly very small effusion.  No ecchymosis.  Was knee was not particularly warm.  No popliteal pain or mass.  No calf pain  Specialty Comments:  No specialty comments available.  Imaging: XR KNEE 3 VIEW RIGHT  Result Date: 10/20/2021 Films of the right knee are obtained in 3 projections standing.  In the medial compartment there is narrowing of the joint space with about 2 to 3 degrees of varus with subchondral sclerosis small peripheral osteophytes and small subchondral cysts.  There are small osteophytes in the lateral compartment and about the patellofemoral joint    PMFS History: Patient Active Problem List   Diagnosis Date Noted   Bilateral primary osteoarthritis of knee 10/20/2021   Tremors of nervous system 02/18/2021   Primary hypertension 02/18/2021   Morbid obesity ([place]) 02/18/2021   Depression with anxiety 02/18/2021   Low back pain 11/10/2020   Unilateral primary osteoarthritis, left knee 01/09/2019   Left knee pain 01/09/2019   TMJ arthropathy 11/08/2016   Complex sleep apnea syndrome 04/15/2015   ANEMIA-NOS 05/16/2009   Attention deficit hyperactivity disorder (ADHD) 05/16/2009   ALLERGIC RHINITIS 05/16/2009   URINARY INCONTINENCE 05/16/2009   NEPHROLITHIASIS, HX OF 05/16/2009   Past Medical History:  Diagnosis Date   ADHD (attention deficit hyperactivity disorder)    Allergy    Anemia    Arthritis    Brain injury 1981   fell off a desk chair   Chicken pox    Depression    Fracture, ankle    Fracture, toe    GERD (gastroesophageal reflux disease)    Menopausal  syndrome    Migraines    Nephrolithiasis    Osteopenia 01/24/09   dexa scan   Sleep apnea    uses cpap 08/2020   Sleep apnea syndrome    could not tolerate CPAP   Urinary incontinence     Family History  Problem Relation Age of Onset   Diabetes Mother        father   Hyperlipidemia Mother    Hypertension Mother    Stroke Mother    Prostate cancer Father    Cancer Father        tumors in kidneys/tumors in liver   Hashimoto's thyroiditis Daughter  Hashimoto's thyroiditis Other        cousins x 2   Colon cancer Neg Hx    Colon polyps Neg Hx    Esophageal cancer Neg Hx    Rectal cancer Neg Hx    Stomach cancer Neg Hx     Past Surgical History:  Procedure Laterality Date   CATARACT EXTRACTION, BILATERAL  04/2020   COLONOSCOPY  09/18/2020   per Dr. Henrene Pastor, adenmatous polyp, repeat in 7 yrs   DILATION AND CURETTAGE OF UTERUS     KNEE ARTHROSCOPY  12/16/2011   left knee, per Dr. Durward Fortes    KNEE ARTHROSCOPY Right 02/22/2018   per Dr. Dorna Leitz , 2 Left arthroscopy-2015&2016   REPAIR NONUNION / MALUNION METATARSAL / TARSAL BONES     right 5th   TONSILLECTOMY     Social History   Occupational History   Occupation: Pharmacist, hospital  Tobacco Use   Smoking status: Never   Smokeless tobacco: Never  Vaping Use   Vaping Use: Never used  Substance and Sexual Activity   Alcohol use: Yes    Alcohol/week: 0.0 standard drinks    Comment: occ   Drug use: No   Sexual activity: Not on file

## 2021-10-27 ENCOUNTER — Other Ambulatory Visit: Payer: Self-pay

## 2021-10-27 ENCOUNTER — Ambulatory Visit (INDEPENDENT_AMBULATORY_CARE_PROVIDER_SITE_OTHER): Payer: Medicare PPO | Admitting: Family Medicine

## 2021-10-27 VITALS — BP 124/80 | HR 86 | Temp 98.4°F | Wt 184.2 lb

## 2021-10-27 DIAGNOSIS — E785 Hyperlipidemia, unspecified: Secondary | ICD-10-CM

## 2021-10-27 DIAGNOSIS — I10 Essential (primary) hypertension: Secondary | ICD-10-CM

## 2021-10-27 LAB — LIPID PANEL
Cholesterol: 151 mg/dL (ref 0–200)
HDL: 45.8 mg/dL (ref >39.00)
LDL Cholesterol: 87 mg/dL (ref 0–99)
NonHDL: 104.97
Total CHOL/HDL Ratio: 3
Triglycerides: 89 mg/dL (ref 0.0–149.0)
VLDL: 17.8 mg/dL (ref 0.0–40.0)

## 2021-10-27 NOTE — Progress Notes (Signed)
   Subjective:    Patient ID: Brandi Parker, female    DOB: December 09, 1956, 65 y.o.   MRN: [id number]  HPI Here to follow up on elevated lipids. She had a well exam on 09-15-21, and her LDL was a bit high at 111 and the TG was at 207. Since then she has modified her diet and she has lost 14 lbs. She is fasting this morning.    Review of Systems  Constitutional: Negative.   Respiratory: Negative.    Cardiovascular: Negative.       Objective:   Physical Exam Constitutional:      Appearance: Normal appearance.  Cardiovascular:     Rate and Rhythm: Normal rate and regular rhythm.     Pulses: Normal pulses.     Heart sounds: Normal heart sounds.  Pulmonary:     Effort: Pulmonary effort is normal.     Breath sounds: Normal breath sounds.  Neurological:     Mental Status: She is alert.          Assessment & Plan:  Dyslipidemia, we will check a lipid panel today. Her HTN is well controlled.  Brandi Penna, MD

## 2021-10-27 NOTE — Addendum Note (Signed)
Addended by: Amanda Cockayne on: 10/27/2021 09:02 AM   Modules accepted: Orders

## 2021-10-28 ENCOUNTER — Telehealth: Payer: Self-pay | Admitting: Pulmonary Disease

## 2021-10-28 NOTE — Telephone Encounter (Signed)
I called the patient and she will keep the OV with Dr. Halford Chessman in person and does not want a Mychart visit. Nothing further needed.

## 2021-10-28 NOTE — Telephone Encounter (Signed)
Noted  

## 2021-11-24 ENCOUNTER — Encounter: Payer: Self-pay | Admitting: Pulmonary Disease

## 2021-11-24 ENCOUNTER — Other Ambulatory Visit: Payer: Self-pay

## 2021-11-24 ENCOUNTER — Ambulatory Visit: Payer: Medicare PPO | Admitting: Pulmonary Disease

## 2021-11-24 VITALS — BP 136/80 | HR 81 | Temp 98.1°F | Ht 64.5 in | Wt 188.8 lb

## 2021-11-24 DIAGNOSIS — G4733 Obstructive sleep apnea (adult) (pediatric): Secondary | ICD-10-CM

## 2021-11-24 NOTE — Progress Notes (Signed)
[facility]  Chief Complaint  Patient presents with   Follow-up    Needs new machine     Constitutional:  BP 136/80 (BP Location: Left Arm, Patient Position: Sitting, Cuff Size: Normal)   Pulse 81   Temp 98.1 F (36.7 C) (Oral)   Ht 5' 4.5" (1.638 m)   Wt 188 lb 12.8 oz (85.6 kg)   SpO2 99%   BMI 31.91 kg/m   Past Medical History:  ADHD, Allergies, Anemia, Depression, GERD, Migraine HA, Nephrolithiasis, Osteopenia  Past Surgical History:  She  has a past surgical history that includes Tonsillectomy; Dilation and curettage of uterus; Repair nonunion / malunion metatarsal / tarsal bones; Colonoscopy (09/18/2020); Knee arthroscopy (12/16/2011); Knee arthroscopy (Right, 02/22/2018); and Cataract extraction, bilateral (04/2020).  Brief Summary:  Brandi Parker is a 65 y.o. female with obstructive sleep apnea.      Subjective:   She has new insurance and would like to get new machine.  Still using her old Bipap at 9/9 cm H2O.  She uses [facility] before, but would like to try a different DME in the future once she gets her new equipment.  Physical Exam:   Appearance - well kempt   ENMT - no sinus tenderness, no oral exudate, no LAN, Mallampati 3 airway, no stridor  Respiratory - equal breath sounds bilaterally, no wheezing or rales  CV - s1s2 regular rate and rhythm, no murmurs  Ext - no clubbing, no edema  Skin - no rashes  Psych - normal mood and affect   Sleep Tests:  PSG 09/08/15 >> AHI 40.6, SpO2 low 88%, CAI 15.3, CPAP 9 cm H2O >> AHI 2.5 CPAP 05/13/17 to 06/11/17 >> used on 21 of 30 nights with average 5 hrs 34 min.  Average AHI 1.7 with CPAP 9 cm H2O  Social History:  She  reports that she has never smoked. She has never used smokeless tobacco. She reports current alcohol use. She reports that she does not use drugs.  Family History:  Her family history includes Cancer in her father; Diabetes in her mother;  Hashimoto's thyroiditis in her daughter and another family member; Hyperlipidemia in her mother; Hypertension in her mother; Prostate cancer in her father; Stroke in her mother.     Assessment/Plan:   Obstructive sleep apnea. - she has history of TMJ - she is compliant with therapy and reports benefit from therapy - she used [facility] before, but would like to switch to a new DME if possible - will arrange for home sleep study to assess current status of sleep apnea and then arrange for new auto CPAP machine  Time Spent Involved in Patient Care on Day of Examination:  15 minutes  Follow up:   Patient Instructions  Will arrange for home sleep study Will call to arrange for follow up after sleep study reviewed  Medication List:   Allergies as of 11/24/2021       Reactions   Coconut Flavor Swelling   coconut   Penicillins Swelling   REACTION: anaphylaxis REACTION: anaphylaxis   Sulfamethoxazole-trimethoprim Swelling   REACTION: rash REACTION: rash   Strawberry Flavor    All berries   Sulfa Antibiotics    Other reaction(s): Unknown        Medication List        Accurate as of November 24, 2021  4:53 PM. If you have any questions, ask your nurse or doctor.  albuterol 108 (90 Base) MCG/ACT inhaler Commonly known as: VENTOLIN HFA SMARTSIG:1-2 Puff(s) By Mouth Every 4-6 Hours PRN   calcium carbonate 1500 (600 Ca) MG Tabs tablet Commonly known as: OSCAL Take by mouth daily.   cetirizine 10 MG tablet Commonly known as: ZYRTEC Take 10 mg by mouth daily.   eletriptan 40 MG tablet Commonly known as: RELPAX Take 1 tablet (40 mg total) by mouth as needed for migraine. may repeat in 2 hours if necessary   escitalopram 20 MG tablet Commonly known as: LEXAPRO TAKE 1 TABLET(20 MG) BY MOUTH DAILY   estradiol 0.025 MG/24HR Commonly known as: VIVELLE-DOT Place 1 patch onto the skin once a week.   gabapentin 300 MG capsule Commonly known as: NEURONTIN TAKE 1  CAPSULE(300 MG) BY MOUTH THREE TIMES DAILY   losartan 50 MG tablet Commonly known as: COZAAR Take 1 tablet (50 mg total) by mouth daily.   methocarbamol 500 MG tablet Commonly known as: ROBAXIN TAKE 1 TABLET BY MOUTH TWICE DAILY AS NEEDED   Naproxen Sodium 220 MG Caps Take 2 capsules by mouth daily.   omeprazole 20 MG capsule Commonly known as: PRILOSEC Take 1 capsule (20 mg total) by mouth daily.   oxybutynin 10 MG 24 hr tablet Commonly known as: Ditropan XL Take 1 tablet (10 mg total) by mouth at bedtime.   phentermine 37.5 MG capsule Take 1 capsule (37.5 mg total) by mouth every morning.   progesterone 100 MG capsule Commonly known as: PROMETRIUM Take 100 mg by mouth daily.   valACYclovir 1000 MG tablet Commonly known as: VALTREX Take 1 tablet (1,000 mg total) by mouth as needed.   Vitamin D 50 MCG (2000 UT) tablet Take 2,000 Units by mouth daily.   WOMENS 50+ ADVANCED PO Take by mouth.        Signature:  Chesley Mires, MD [place] Pager - [phone] 11/24/2021, 4:53 PM

## 2021-11-24 NOTE — Patient Instructions (Signed)
Will arrange for home sleep study Will call to arrange for follow up after sleep study reviewed  

## 2021-12-18 ENCOUNTER — Other Ambulatory Visit: Payer: Self-pay

## 2021-12-18 ENCOUNTER — Telehealth: Payer: Self-pay | Admitting: Family Medicine

## 2021-12-18 MED ORDER — ESCITALOPRAM OXALATE 20 MG PO TABS: ORAL_TABLET | ORAL | 0 refills | Status: DC

## 2021-12-18 MED ORDER — LOSARTAN POTASSIUM 50 MG PO TABS: 50.0000 mg | ORAL_TABLET | Freq: Every day | ORAL | 2 refills | Status: DC

## 2021-12-18 NOTE — Telephone Encounter (Signed)
Refills sent to the pharmacy.

## 2021-12-18 NOTE — Telephone Encounter (Signed)
Patient called to get refill on escitalopram (LEXAPRO) 20 MG tablet and losartan (COZAAR) 50 MG tablet   Please send to  [id number] - [place], [place] [address] [place] Phone:  [phone]  Fax:  [phone]         Please advise

## 2021-12-23 DIAGNOSIS — J3081 Allergic rhinitis due to animal (cat) (dog) hair and dander: Secondary | ICD-10-CM | POA: Diagnosis not present

## 2021-12-23 DIAGNOSIS — J301 Allergic rhinitis due to pollen: Secondary | ICD-10-CM | POA: Diagnosis not present

## 2021-12-23 DIAGNOSIS — J3089 Other allergic rhinitis: Secondary | ICD-10-CM | POA: Diagnosis not present

## 2021-12-29 ENCOUNTER — Other Ambulatory Visit: Payer: Self-pay | Admitting: Physical Medicine and Rehabilitation

## 2021-12-29 DIAGNOSIS — M792 Neuralgia and neuritis, unspecified: Secondary | ICD-10-CM

## 2021-12-30 DIAGNOSIS — J301 Allergic rhinitis due to pollen: Secondary | ICD-10-CM | POA: Diagnosis not present

## 2021-12-30 DIAGNOSIS — J3089 Other allergic rhinitis: Secondary | ICD-10-CM | POA: Diagnosis not present

## 2021-12-30 DIAGNOSIS — J3081 Allergic rhinitis due to animal (cat) (dog) hair and dander: Secondary | ICD-10-CM | POA: Diagnosis not present

## 2021-12-31 ENCOUNTER — Other Ambulatory Visit: Payer: Self-pay | Admitting: Family Medicine

## 2022-01-01 NOTE — Telephone Encounter (Signed)
Last OV- 11/18/21 Last refill-09/15/21-30 capsules, 2 refills  No future OV scheduled.  Can this patient receive a refill?

## 2022-01-06 DIAGNOSIS — J3089 Other allergic rhinitis: Secondary | ICD-10-CM | POA: Diagnosis not present

## 2022-01-06 DIAGNOSIS — J301 Allergic rhinitis due to pollen: Secondary | ICD-10-CM | POA: Diagnosis not present

## 2022-01-06 DIAGNOSIS — J3081 Allergic rhinitis due to animal (cat) (dog) hair and dander: Secondary | ICD-10-CM | POA: Diagnosis not present

## 2022-01-13 DIAGNOSIS — J3081 Allergic rhinitis due to animal (cat) (dog) hair and dander: Secondary | ICD-10-CM | POA: Diagnosis not present

## 2022-01-13 DIAGNOSIS — J301 Allergic rhinitis due to pollen: Secondary | ICD-10-CM | POA: Diagnosis not present

## 2022-01-13 DIAGNOSIS — J3089 Other allergic rhinitis: Secondary | ICD-10-CM | POA: Diagnosis not present

## 2022-01-21 ENCOUNTER — Ambulatory Visit (INDEPENDENT_AMBULATORY_CARE_PROVIDER_SITE_OTHER): Payer: Medicare PPO

## 2022-01-21 ENCOUNTER — Other Ambulatory Visit: Payer: Self-pay

## 2022-01-21 DIAGNOSIS — G4733 Obstructive sleep apnea (adult) (pediatric): Secondary | ICD-10-CM

## 2022-01-25 ENCOUNTER — Telehealth: Payer: Self-pay | Admitting: Pulmonary Disease

## 2022-01-25 ENCOUNTER — Other Ambulatory Visit: Payer: Self-pay

## 2022-01-25 DIAGNOSIS — G4733 Obstructive sleep apnea (adult) (pediatric): Secondary | ICD-10-CM

## 2022-01-25 NOTE — Telephone Encounter (Signed)
Not in network with [facility] I have sent it to [facility]

## 2022-01-25 NOTE — Telephone Encounter (Signed)
HST 01/22/22 >> AHI 61, SpO2 low 73%   Please inform her that her sleep study shows severe obstructive sleep apnea.  Please send an order to [facility] to arrange for a new [employer] auto CPAP machine with pressure 5 to 15 cm H2O and heated humidity.  Please schedule ROV in 5 months.

## 2022-01-25 NOTE — Telephone Encounter (Signed)
I have sent the order to [facility] waiting to see if they can take the patient

## 2022-01-25 NOTE — Telephone Encounter (Signed)
Gave patient results from HST, she voiced understanding but would like to use a different DME besides [facility] for new CPAP machine. Looking in her chart, last order for CPAP was sent to [facility] on 09/18/2015. Advised patient I would check into this for her. Patient has [employer] now.   Will route to PCCs to see if they can advise on pt going to a new DME. Order for new cpap has been placed with note in order that patient wants a new DME.

## 2022-01-25 NOTE — Telephone Encounter (Signed)
ATC patient. LMTCB with [facility] office number  

## 2022-01-26 NOTE — Telephone Encounter (Signed)
Called patient but she did not answer. Left message for her to call back.  

## 2022-01-26 NOTE — Telephone Encounter (Signed)
I have sent it to [facility] they have confirmed it

## 2022-01-27 NOTE — Telephone Encounter (Signed)
Spoke with the pt and let her know that her CPAP order was sent to [facility]  I gave her their phone number in case she wants to check on status of order  Nothing further needed

## 2022-01-29 DIAGNOSIS — J3081 Allergic rhinitis due to animal (cat) (dog) hair and dander: Secondary | ICD-10-CM | POA: Diagnosis not present

## 2022-01-29 DIAGNOSIS — J301 Allergic rhinitis due to pollen: Secondary | ICD-10-CM | POA: Diagnosis not present

## 2022-01-29 DIAGNOSIS — J3089 Other allergic rhinitis: Secondary | ICD-10-CM | POA: Diagnosis not present

## 2022-02-05 DIAGNOSIS — J3089 Other allergic rhinitis: Secondary | ICD-10-CM | POA: Diagnosis not present

## 2022-02-05 DIAGNOSIS — J301 Allergic rhinitis due to pollen: Secondary | ICD-10-CM | POA: Diagnosis not present

## 2022-02-05 DIAGNOSIS — J3081 Allergic rhinitis due to animal (cat) (dog) hair and dander: Secondary | ICD-10-CM | POA: Diagnosis not present

## 2022-02-11 DIAGNOSIS — J3089 Other allergic rhinitis: Secondary | ICD-10-CM | POA: Diagnosis not present

## 2022-02-11 DIAGNOSIS — J3081 Allergic rhinitis due to animal (cat) (dog) hair and dander: Secondary | ICD-10-CM | POA: Diagnosis not present

## 2022-02-11 DIAGNOSIS — J301 Allergic rhinitis due to pollen: Secondary | ICD-10-CM | POA: Diagnosis not present

## 2022-02-24 DIAGNOSIS — J301 Allergic rhinitis due to pollen: Secondary | ICD-10-CM | POA: Diagnosis not present

## 2022-02-24 DIAGNOSIS — J3081 Allergic rhinitis due to animal (cat) (dog) hair and dander: Secondary | ICD-10-CM | POA: Diagnosis not present

## 2022-02-24 DIAGNOSIS — J3089 Other allergic rhinitis: Secondary | ICD-10-CM | POA: Diagnosis not present

## 2022-03-03 DIAGNOSIS — J301 Allergic rhinitis due to pollen: Secondary | ICD-10-CM | POA: Diagnosis not present

## 2022-03-03 DIAGNOSIS — J3081 Allergic rhinitis due to animal (cat) (dog) hair and dander: Secondary | ICD-10-CM | POA: Diagnosis not present

## 2022-03-03 DIAGNOSIS — J3089 Other allergic rhinitis: Secondary | ICD-10-CM | POA: Diagnosis not present

## 2022-03-08 DIAGNOSIS — J3089 Other allergic rhinitis: Secondary | ICD-10-CM | POA: Diagnosis not present

## 2022-03-08 DIAGNOSIS — J3081 Allergic rhinitis due to animal (cat) (dog) hair and dander: Secondary | ICD-10-CM | POA: Diagnosis not present

## 2022-03-08 DIAGNOSIS — J301 Allergic rhinitis due to pollen: Secondary | ICD-10-CM | POA: Diagnosis not present

## 2022-03-12 DIAGNOSIS — J3089 Other allergic rhinitis: Secondary | ICD-10-CM | POA: Diagnosis not present

## 2022-03-12 DIAGNOSIS — J3081 Allergic rhinitis due to animal (cat) (dog) hair and dander: Secondary | ICD-10-CM | POA: Diagnosis not present

## 2022-03-12 DIAGNOSIS — J301 Allergic rhinitis due to pollen: Secondary | ICD-10-CM | POA: Diagnosis not present

## 2022-03-17 ENCOUNTER — Other Ambulatory Visit: Payer: Self-pay | Admitting: Family Medicine

## 2022-03-19 ENCOUNTER — Other Ambulatory Visit: Payer: Self-pay | Admitting: Family Medicine

## 2022-03-19 ENCOUNTER — Other Ambulatory Visit: Payer: Self-pay | Admitting: Physical Medicine and Rehabilitation

## 2022-03-19 DIAGNOSIS — M792 Neuralgia and neuritis, unspecified: Secondary | ICD-10-CM

## 2022-04-02 DIAGNOSIS — J3081 Allergic rhinitis due to animal (cat) (dog) hair and dander: Secondary | ICD-10-CM | POA: Diagnosis not present

## 2022-04-02 DIAGNOSIS — J301 Allergic rhinitis due to pollen: Secondary | ICD-10-CM | POA: Diagnosis not present

## 2022-04-02 DIAGNOSIS — J3089 Other allergic rhinitis: Secondary | ICD-10-CM | POA: Diagnosis not present

## 2022-04-07 DIAGNOSIS — H1045 Other chronic allergic conjunctivitis: Secondary | ICD-10-CM | POA: Diagnosis not present

## 2022-04-07 DIAGNOSIS — Z6823 Body mass index (BMI) 23.0-23.9, adult: Secondary | ICD-10-CM | POA: Diagnosis not present

## 2022-04-07 DIAGNOSIS — J3089 Other allergic rhinitis: Secondary | ICD-10-CM | POA: Diagnosis not present

## 2022-04-07 DIAGNOSIS — Z01419 Encounter for gynecological examination (general) (routine) without abnormal findings: Secondary | ICD-10-CM | POA: Diagnosis not present

## 2022-04-07 DIAGNOSIS — R062 Wheezing: Secondary | ICD-10-CM | POA: Diagnosis not present

## 2022-04-07 DIAGNOSIS — J301 Allergic rhinitis due to pollen: Secondary | ICD-10-CM | POA: Diagnosis not present

## 2022-04-07 DIAGNOSIS — Z1231 Encounter for screening mammogram for malignant neoplasm of breast: Secondary | ICD-10-CM | POA: Diagnosis not present

## 2022-04-07 DIAGNOSIS — J3081 Allergic rhinitis due to animal (cat) (dog) hair and dander: Secondary | ICD-10-CM | POA: Diagnosis not present

## 2022-04-07 LAB — HM MAMMOGRAPHY

## 2022-04-16 DIAGNOSIS — J3081 Allergic rhinitis due to animal (cat) (dog) hair and dander: Secondary | ICD-10-CM | POA: Diagnosis not present

## 2022-04-16 DIAGNOSIS — J301 Allergic rhinitis due to pollen: Secondary | ICD-10-CM | POA: Diagnosis not present

## 2022-04-16 DIAGNOSIS — J3089 Other allergic rhinitis: Secondary | ICD-10-CM | POA: Diagnosis not present

## 2022-04-28 ENCOUNTER — Encounter: Payer: Self-pay | Admitting: Family Medicine

## 2022-05-12 ENCOUNTER — Ambulatory Visit: Payer: Medicare PPO | Admitting: Family Medicine

## 2022-05-12 ENCOUNTER — Encounter: Payer: Self-pay | Admitting: Family Medicine

## 2022-05-12 VITALS — BP 130/82 | HR 81 | Temp 98.7°F | Wt 186.0 lb

## 2022-05-12 DIAGNOSIS — J3089 Other allergic rhinitis: Secondary | ICD-10-CM | POA: Diagnosis not present

## 2022-05-12 DIAGNOSIS — F418 Other specified anxiety disorders: Secondary | ICD-10-CM | POA: Diagnosis not present

## 2022-05-12 DIAGNOSIS — J3081 Allergic rhinitis due to animal (cat) (dog) hair and dander: Secondary | ICD-10-CM | POA: Diagnosis not present

## 2022-05-12 DIAGNOSIS — J301 Allergic rhinitis due to pollen: Secondary | ICD-10-CM | POA: Diagnosis not present

## 2022-05-12 MED ORDER — VENLAFAXINE HCL ER 150 MG PO CP24: 150.0000 mg | ORAL_CAPSULE | Freq: Every day | ORAL | 2 refills | Status: DC

## 2022-05-12 NOTE — Progress Notes (Signed)
   Subjective:    Patient ID: Brandi Parker, female    DOB: 1956/03/30, 66 y.o.   MRN: [id number]  HPI Here to follow up on depression with anxiety. She has been on Lexapro for the past 8 months. This helped her a good deal for awhile, but over the past month she feelsl it is not working so well. She has had a number of stressors in her life lately, not the least of which is her mother's health problems. Her mother has been in and out of the hospital several times, and the responsibility of caring for her has fallen on [place]. Even though Sireen has siblings in the area, she seems to be the one who will take care of things. She sleeps well, but she feels stressed and out of control. She has been irritable and she cries easily. Even though she is taking Phentermine, she has gained some weight recently because she tends to crave "comfort foods". Also she used to see a therapist that was available through her employer, but since she retured she has not worked with anyone for several years.    Review of Systems  Constitutional: Negative.   Respiratory: Negative.    Cardiovascular: Negative.   Psychiatric/Behavioral:  Positive for agitation, decreased concentration and dysphoric mood. Negative for behavioral problems, confusion, hallucinations, self-injury, sleep disturbance and suicidal ideas. The patient is nervous/anxious.       Objective:   Physical Exam Constitutional:      Appearance: Normal appearance.  Cardiovascular:     Rate and Rhythm: Normal rate and regular rhythm.     Pulses: Normal pulses.     Heart sounds: Normal heart sounds.  Pulmonary:     Effort: Pulmonary effort is normal.     Breath sounds: Normal breath sounds.  Neurological:     Mental Status: She is alert.  Psychiatric:        Behavior: Behavior normal.        Thought Content: Thought content normal.     Comments: Somewhat anxious           Assessment & Plan:  Depression with anxiety. We will stop the Lexapro  and try Effexor XR 150 mg daily. I urged her to start working with a therapist again, and she agreed. We will follow up in 4 weeks.  Alysia Penna, MD

## 2022-05-21 DIAGNOSIS — J3089 Other allergic rhinitis: Secondary | ICD-10-CM | POA: Diagnosis not present

## 2022-05-21 DIAGNOSIS — J3081 Allergic rhinitis due to animal (cat) (dog) hair and dander: Secondary | ICD-10-CM | POA: Diagnosis not present

## 2022-05-21 DIAGNOSIS — J301 Allergic rhinitis due to pollen: Secondary | ICD-10-CM | POA: Diagnosis not present

## 2022-05-26 ENCOUNTER — Other Ambulatory Visit: Payer: Self-pay | Admitting: Family Medicine

## 2022-06-01 ENCOUNTER — Telehealth: Payer: Self-pay | Admitting: Family Medicine

## 2022-06-01 NOTE — Telephone Encounter (Signed)
c/o frequent burning urination, blood tinged x 2 days. Wants to know if you prefer an appointment or if she can just drop off a urine specimen

## 2022-06-01 NOTE — Telephone Encounter (Signed)
Please advise 

## 2022-06-03 NOTE — Telephone Encounter (Signed)
OV scheduled 06/04/22'@130p'$ 

## 2022-06-03 NOTE — Telephone Encounter (Signed)
It is always best to make an OV so we can send a sample for culture

## 2022-06-04 ENCOUNTER — Encounter: Payer: Self-pay | Admitting: Family Medicine

## 2022-06-04 ENCOUNTER — Ambulatory Visit: Payer: Medicare PPO | Admitting: Family Medicine

## 2022-06-04 VITALS — BP 128/80 | HR 102 | Temp 100.4°F | Wt 186.5 lb

## 2022-06-04 DIAGNOSIS — J301 Allergic rhinitis due to pollen: Secondary | ICD-10-CM | POA: Diagnosis not present

## 2022-06-04 DIAGNOSIS — N390 Urinary tract infection, site not specified: Secondary | ICD-10-CM | POA: Diagnosis not present

## 2022-06-04 DIAGNOSIS — J3081 Allergic rhinitis due to animal (cat) (dog) hair and dander: Secondary | ICD-10-CM | POA: Diagnosis not present

## 2022-06-04 DIAGNOSIS — R300 Dysuria: Secondary | ICD-10-CM | POA: Diagnosis not present

## 2022-06-04 DIAGNOSIS — J3089 Other allergic rhinitis: Secondary | ICD-10-CM | POA: Diagnosis not present

## 2022-06-04 LAB — POC URINALSYSI DIPSTICK (AUTOMATED)
Bilirubin, UA: NEGATIVE
Glucose, UA: NEGATIVE
Ketones, UA: NEGATIVE
Nitrite, UA: NEGATIVE
Protein, UA: POSITIVE — AB
Spec Grav, UA: 1.015 (ref 1.010–1.025)
Urobilinogen, UA: 0.2 E.U./dL
pH, UA: 6 (ref 5.0–8.0)

## 2022-06-04 MED ORDER — PHENAZOPYRIDINE HCL 200 MG PO TABS: 200.0000 mg | ORAL_TABLET | Freq: Three times a day (TID) | ORAL | 1 refills | Status: DC | PRN

## 2022-06-04 MED ORDER — CIPROFLOXACIN HCL 500 MG PO TABS: 500.0000 mg | ORAL_TABLET | Freq: Two times a day (BID) | ORAL | 0 refills | Status: DC

## 2022-06-04 NOTE — Progress Notes (Signed)
   Subjective:    Patient ID: Brandi Parker, female    DOB: Apr 06, 1956, 66 y.o.   MRN: [id number]  HPI Here for 4 days of urinary urgency and burning. No fever that she knows of. Drinking plenty of water.    Review of Systems  Constitutional: Negative.   Respiratory: Negative.    Cardiovascular: Negative.   Gastrointestinal: Negative.   Genitourinary:  Positive for dysuria, flank pain and urgency. Negative for hematuria.       Objective:   Physical Exam Constitutional:      Appearance: Normal appearance. She is not ill-appearing.  Cardiovascular:     Rate and Rhythm: Normal rate and regular rhythm.     Pulses: Normal pulses.     Heart sounds: Normal heart sounds.  Pulmonary:     Effort: Pulmonary effort is normal.     Breath sounds: Normal breath sounds.  Abdominal:     Tenderness: There is no right CVA tenderness or left CVA tenderness.  Neurological:     Mental Status: She is alert.           Assessment & Plan:  UTI, treat with 7 days of Cipro. Culture the sample. Use Pyridium as needed.  Alysia Penna, MD

## 2022-06-07 LAB — URINE CULTURE
MICRO NUMBER:: 13535920
SPECIMEN QUALITY:: ADEQUATE

## 2022-06-11 ENCOUNTER — Encounter: Payer: Self-pay | Admitting: Family Medicine

## 2022-06-11 ENCOUNTER — Ambulatory Visit (INDEPENDENT_AMBULATORY_CARE_PROVIDER_SITE_OTHER): Payer: Medicare PPO | Admitting: Family Medicine

## 2022-06-11 VITALS — BP 128/78 | HR 88 | Temp 98.5°F | Wt 186.0 lb

## 2022-06-11 DIAGNOSIS — F418 Other specified anxiety disorders: Secondary | ICD-10-CM

## 2022-06-11 DIAGNOSIS — J3089 Other allergic rhinitis: Secondary | ICD-10-CM | POA: Diagnosis not present

## 2022-06-11 DIAGNOSIS — J301 Allergic rhinitis due to pollen: Secondary | ICD-10-CM | POA: Diagnosis not present

## 2022-06-11 DIAGNOSIS — J3081 Allergic rhinitis due to animal (cat) (dog) hair and dander: Secondary | ICD-10-CM | POA: Diagnosis not present

## 2022-06-11 DIAGNOSIS — N390 Urinary tract infection, site not specified: Secondary | ICD-10-CM | POA: Diagnosis not present

## 2022-06-13 ENCOUNTER — Other Ambulatory Visit: Payer: Self-pay | Admitting: Family Medicine

## 2022-06-18 DIAGNOSIS — J3089 Other allergic rhinitis: Secondary | ICD-10-CM | POA: Diagnosis not present

## 2022-06-18 DIAGNOSIS — J3081 Allergic rhinitis due to animal (cat) (dog) hair and dander: Secondary | ICD-10-CM | POA: Diagnosis not present

## 2022-06-18 DIAGNOSIS — J301 Allergic rhinitis due to pollen: Secondary | ICD-10-CM | POA: Diagnosis not present

## 2022-07-02 DIAGNOSIS — J3089 Other allergic rhinitis: Secondary | ICD-10-CM | POA: Diagnosis not present

## 2022-07-02 DIAGNOSIS — J3081 Allergic rhinitis due to animal (cat) (dog) hair and dander: Secondary | ICD-10-CM | POA: Diagnosis not present

## 2022-07-02 DIAGNOSIS — J301 Allergic rhinitis due to pollen: Secondary | ICD-10-CM | POA: Diagnosis not present

## 2022-07-17 ENCOUNTER — Other Ambulatory Visit: Payer: Self-pay | Admitting: Family Medicine

## 2022-08-04 DIAGNOSIS — J3089 Other allergic rhinitis: Secondary | ICD-10-CM | POA: Diagnosis not present

## 2022-08-04 DIAGNOSIS — J301 Allergic rhinitis due to pollen: Secondary | ICD-10-CM | POA: Diagnosis not present

## 2022-08-04 DIAGNOSIS — J3081 Allergic rhinitis due to animal (cat) (dog) hair and dander: Secondary | ICD-10-CM | POA: Diagnosis not present

## 2022-08-05 ENCOUNTER — Other Ambulatory Visit: Payer: Self-pay | Admitting: Family Medicine

## 2022-08-10 ENCOUNTER — Telehealth: Payer: Self-pay | Admitting: Family Medicine

## 2022-08-10 ENCOUNTER — Other Ambulatory Visit: Payer: Self-pay

## 2022-08-10 MED ORDER — PHENTERMINE HCL 37.5 MG PO CAPS: 37.5000 mg | ORAL_CAPSULE | Freq: Every morning | ORAL | 5 refills | Status: DC

## 2022-08-10 NOTE — Telephone Encounter (Signed)
Pt rquesting a refill phentermine 37.5 MG capsule  [id number] [place] [place] - [address]:  [phone] Fax:  [phone]   [place] and wishes to have this filled asap.

## 2022-08-10 NOTE — Telephone Encounter (Signed)
Pt Rx sent to her pharmacy

## 2022-08-11 ENCOUNTER — Telehealth: Payer: Self-pay

## 2022-08-11 ENCOUNTER — Other Ambulatory Visit: Payer: Self-pay | Admitting: Family Medicine

## 2022-08-11 ENCOUNTER — Other Ambulatory Visit: Payer: Self-pay

## 2022-08-11 MED ORDER — PHENTERMINE HCL 37.5 MG PO CAPS: 37.5000 mg | ORAL_CAPSULE | Freq: Every morning | ORAL | 5 refills | Status: DC

## 2022-08-11 NOTE — Telephone Encounter (Signed)
We did this earlier today

## 2022-08-13 ENCOUNTER — Telehealth: Payer: Self-pay | Admitting: Family Medicine

## 2022-08-13 NOTE — Telephone Encounter (Signed)
Pt calling checking progress of refill  phentermine 37.5 MG capsule shows sent on 08/11/22 pharmacy says they do not have it

## 2022-08-13 NOTE — Telephone Encounter (Signed)
Pt called upset and yelling and said she is sick and tired of the phone tag.  Pharmacy is telling her one thing and we keep telling her another.  Pt is demanding that CMA call her ASAP!!  with the reason why she still does not have her medication.  [phone]

## 2022-08-13 NOTE — Telephone Encounter (Signed)
Last OV- 06/11/22 Last refill- 08/11/22-pharmacy never received.

## 2022-08-13 NOTE — Telephone Encounter (Signed)
Pt called to say Pharmacy is telling her they still have not received the Rx for the:  phentermine 37.5 MG capsule 30 capsule 5 08/11/2022    Sig - Route: Take 1 capsule (37.5 mg total) by mouth every morning. - Oral   Class: Print   Notes to Pharmacy: Not to exceed 4 additional fills before 03/14/2022    Pt states she is very aggravated and needs this Rx sent in again, asap!!  [id number]- [place], [place] - [address]

## 2022-08-13 NOTE — Telephone Encounter (Signed)
Dr. Barbie Banner patient.  Patient requesting refill for Phentermine 37.'5mg'$  capsules.   Refill was sent to 08/11/22, pharmacy never received.

## 2022-08-15 MED ORDER — PHENTERMINE HCL 37.5 MG PO CAPS
37.5000 mg | ORAL_CAPSULE | Freq: Every morning | ORAL | 0 refills | Status: DC
Start: 2022-08-15 — End: 2022-08-15

## 2022-08-15 MED ORDER — PHENTERMINE HCL 37.5 MG PO CAPS: 37.5000 mg | ORAL_CAPSULE | Freq: Every morning | ORAL | 0 refills | Status: DC

## 2022-08-15 NOTE — Telephone Encounter (Signed)
I sent in one refill until Dr Sarajane Jews returns.

## 2022-08-15 NOTE — Addendum Note (Signed)
Addended by: Eulas Post on: 08/15/2022 03:08 PM   Modules accepted: Orders

## 2022-08-16 NOTE — Telephone Encounter (Signed)
Noted.   Message compete on 08/15/22.

## 2022-08-20 ENCOUNTER — Other Ambulatory Visit: Payer: Self-pay | Admitting: Family Medicine

## 2022-09-01 DIAGNOSIS — J301 Allergic rhinitis due to pollen: Secondary | ICD-10-CM | POA: Diagnosis not present

## 2022-09-01 DIAGNOSIS — J3089 Other allergic rhinitis: Secondary | ICD-10-CM | POA: Diagnosis not present

## 2022-09-01 DIAGNOSIS — J3081 Allergic rhinitis due to animal (cat) (dog) hair and dander: Secondary | ICD-10-CM | POA: Diagnosis not present

## 2022-09-10 DIAGNOSIS — J3089 Other allergic rhinitis: Secondary | ICD-10-CM | POA: Diagnosis not present

## 2022-09-10 DIAGNOSIS — J301 Allergic rhinitis due to pollen: Secondary | ICD-10-CM | POA: Diagnosis not present

## 2022-09-10 DIAGNOSIS — J3081 Allergic rhinitis due to animal (cat) (dog) hair and dander: Secondary | ICD-10-CM | POA: Diagnosis not present

## 2022-09-17 DIAGNOSIS — I10 Essential (primary) hypertension: Secondary | ICD-10-CM | POA: Diagnosis not present

## 2022-09-17 DIAGNOSIS — H16223 Keratoconjunctivitis sicca, not specified as Sjogren's, bilateral: Secondary | ICD-10-CM | POA: Diagnosis not present

## 2022-09-17 DIAGNOSIS — H26493 Other secondary cataract, bilateral: Secondary | ICD-10-CM | POA: Diagnosis not present

## 2022-09-17 DIAGNOSIS — H35033 Hypertensive retinopathy, bilateral: Secondary | ICD-10-CM | POA: Diagnosis not present

## 2022-09-22 DIAGNOSIS — J3089 Other allergic rhinitis: Secondary | ICD-10-CM | POA: Diagnosis not present

## 2022-09-22 DIAGNOSIS — J301 Allergic rhinitis due to pollen: Secondary | ICD-10-CM | POA: Diagnosis not present

## 2022-09-22 DIAGNOSIS — J3081 Allergic rhinitis due to animal (cat) (dog) hair and dander: Secondary | ICD-10-CM | POA: Diagnosis not present

## 2022-09-23 ENCOUNTER — Other Ambulatory Visit: Payer: Self-pay | Admitting: Family Medicine

## 2022-09-24 NOTE — Telephone Encounter (Signed)
Last refill-07/2722--30 capsules Last OV-06/11/22  No future OV scheduled.

## 2022-09-25 IMAGING — CT CT RENAL STONE PROTOCOL
2 of 4 series · 16 of 46 positions shown, 18 images · non-contrast
Comparison: None.

CLINICAL DATA: Right low back pain, right lower quadrant pain,
flank pain

EXAM:
CT ABDOMEN AND PELVIS WITHOUT CONTRAST
TECHNIQUE: Multidetector CT imaging of the abdomen and pelvis was performed
following the standard protocol without IV contrast.

[Series 2: stone study 5.0 br38 1 · axial · 0.69mm/px · z∈[-472,-67]mm · 13 of 89 slices shown, 15 images]
[im 4/89  soft-tissue]
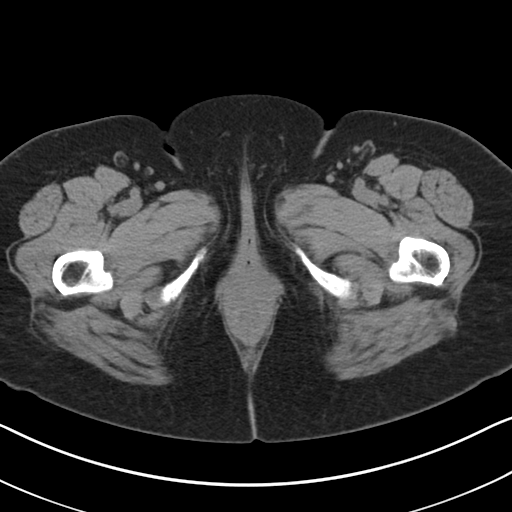
[im 4/89  bone]
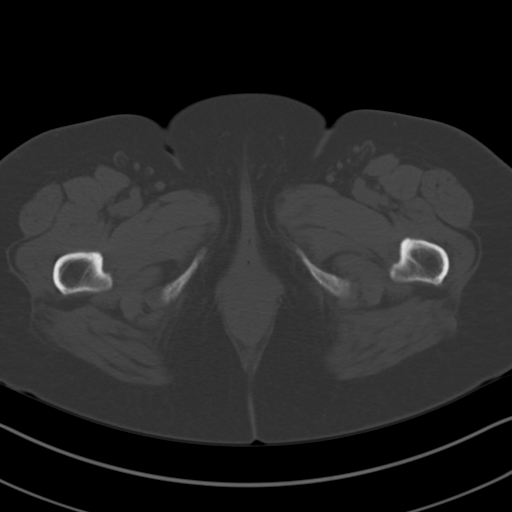
[im 11/89  soft-tissue]
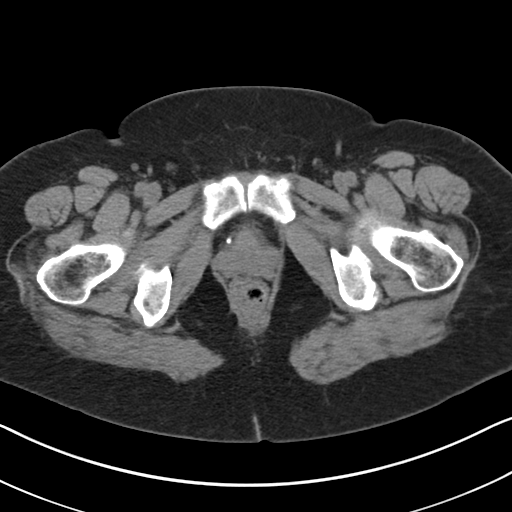
[im 18/89  soft-tissue]
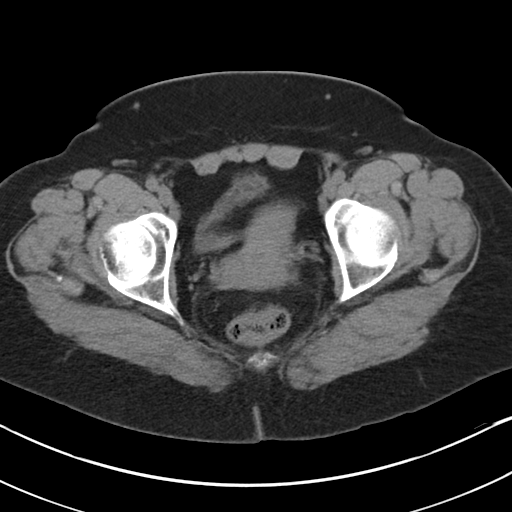
[im 25/89  soft-tissue]
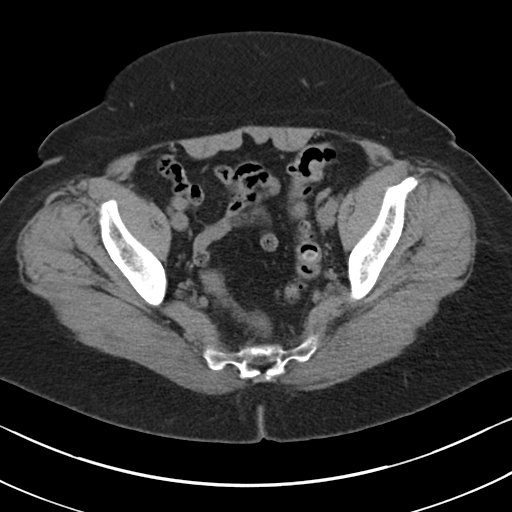
[im 32/89  soft-tissue]
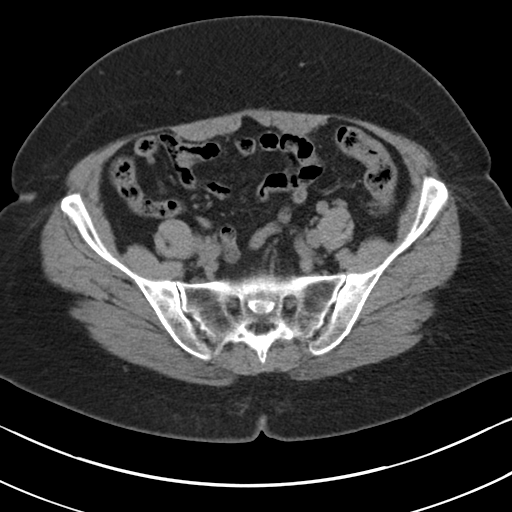
[im 39/89  soft-tissue]
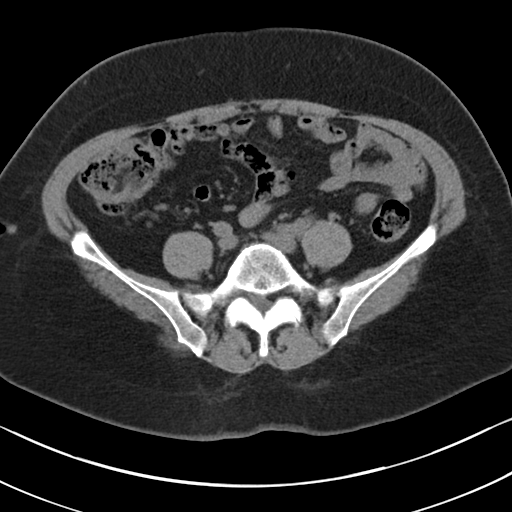
[im 46/89  soft-tissue]
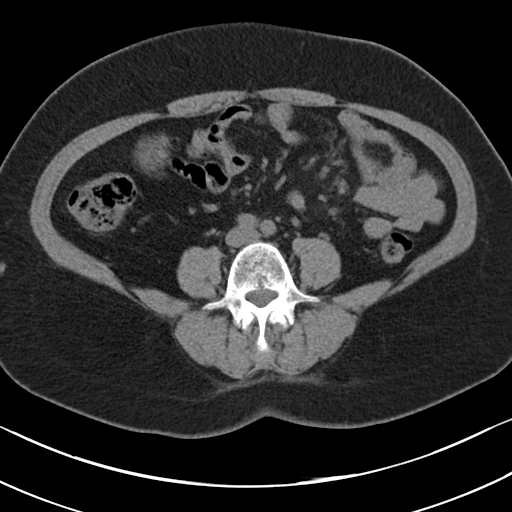
[im 50/89  soft-tissue]
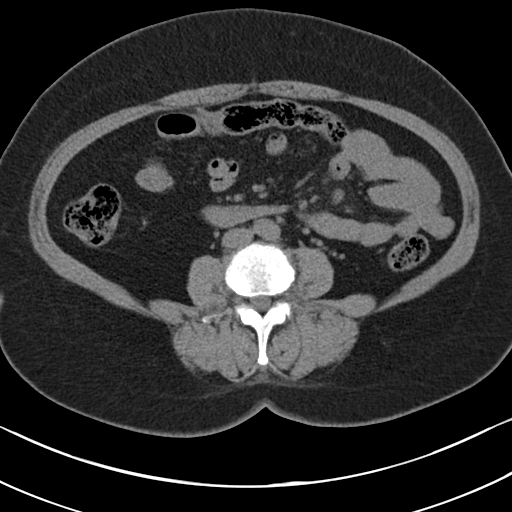
[im 57/89  soft-tissue]
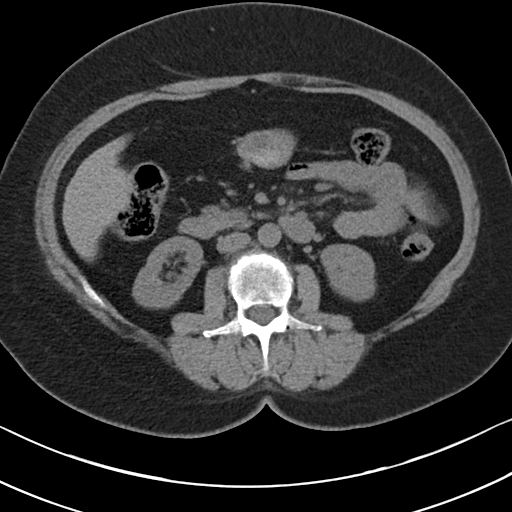
[im 57/89  bone]
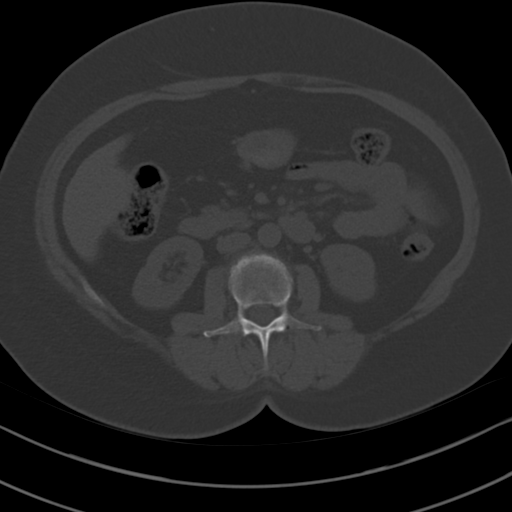
[im 64/89  soft-tissue]
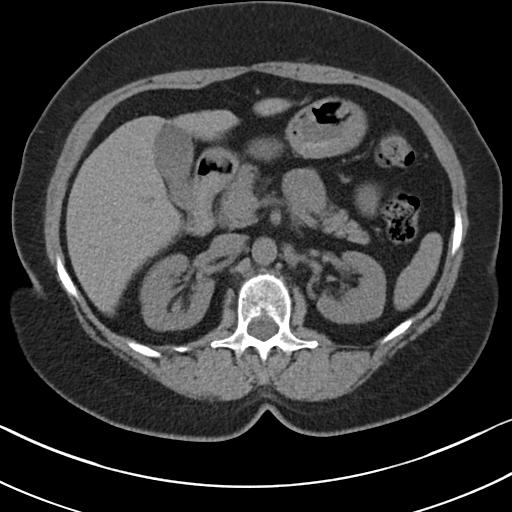
[im 71/89  soft-tissue]
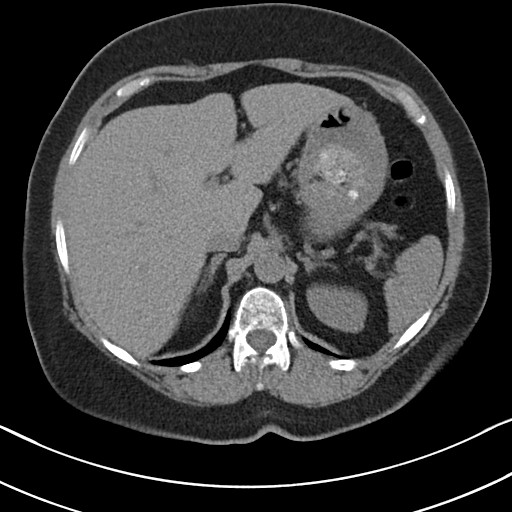
[im 78/89  soft-tissue]
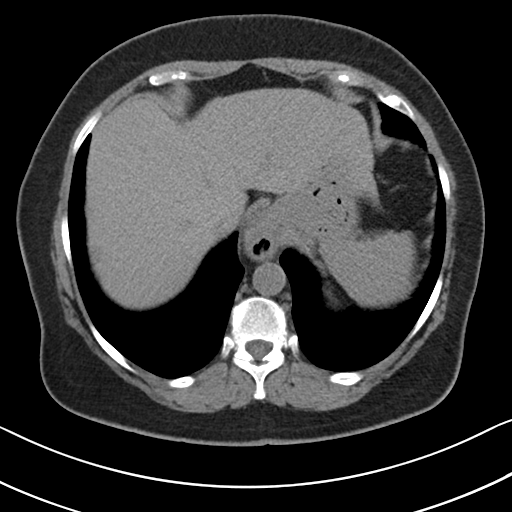
[im 85/89  soft-tissue]
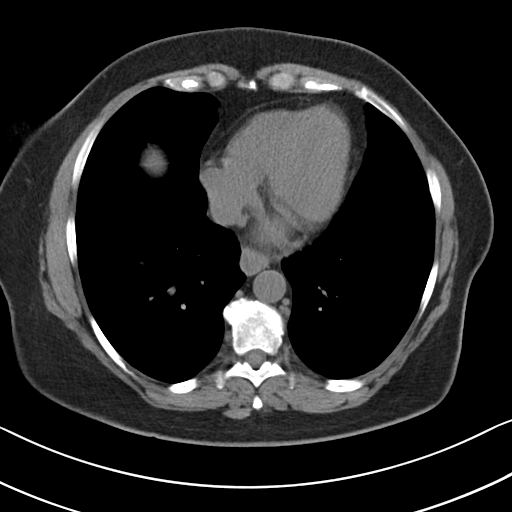

[Series 5: coronal soft tissue · coronal · 0.68mm/px · 3 of 85 slices shown]
[im 29/85  soft-tissue]
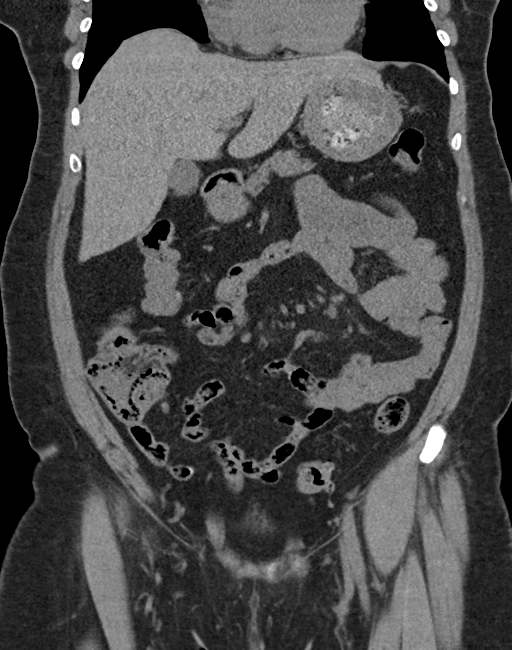
[im 38/85  soft-tissue]
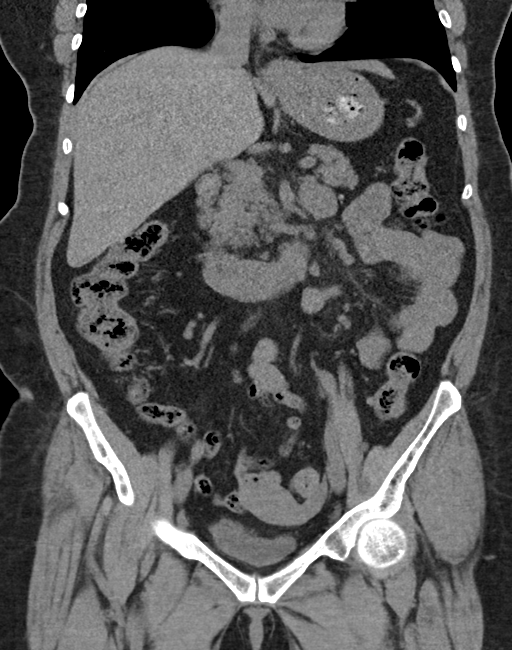
[im 47/85  soft-tissue]
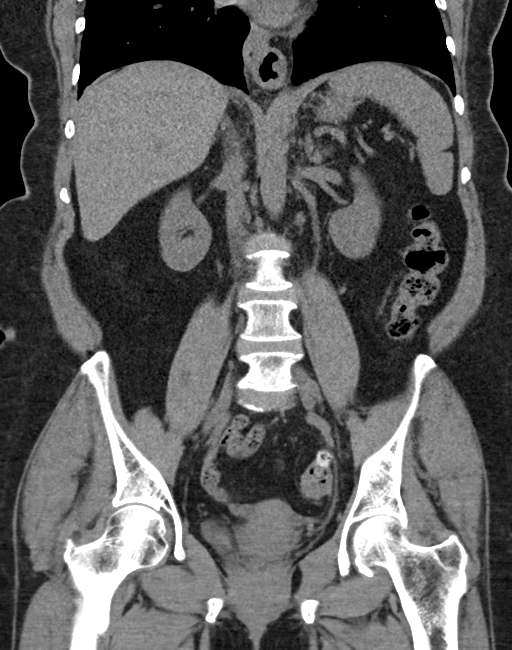

[16 of 46 positions shown; findings below may reference images not displayed]

FINDINGS: Lower chest: Small hiatal hernia.  No acute abnormality.

Hepatobiliary: No focal hepatic abnormality. Gallbladder
unremarkable.

Pancreas: No focal abnormality or ductal dilatation.

Spleen: No focal abnormality.  Normal size.

Adrenals/Urinary Tract: No adrenal abnormality. No focal renal
abnormality. No stones or hydronephrosis. Urinary bladder is
unremarkable.

Stomach/Bowel: Sigmoid diverticulosis. No active diverticulitis.
Stomach and small bowel decompressed, unremarkable. Normal
appendix.

Vascular/Lymphatic: No evidence of aneurysm or adenopathy.

Reproductive: Uterus and adnexa unremarkable.  No mass.

Other: No free fluid or free air.

Musculoskeletal: No acute bony abnormality.
IMPRESSION: No renal or ureteral stones.  No hydronephrosis.

Small hiatal hernia.

Sigmoid diverticulosis.

No acute findings.

## 2022-09-29 DIAGNOSIS — J3089 Other allergic rhinitis: Secondary | ICD-10-CM | POA: Diagnosis not present

## 2022-09-29 DIAGNOSIS — J301 Allergic rhinitis due to pollen: Secondary | ICD-10-CM | POA: Diagnosis not present

## 2022-09-29 DIAGNOSIS — J3081 Allergic rhinitis due to animal (cat) (dog) hair and dander: Secondary | ICD-10-CM | POA: Diagnosis not present

## 2022-10-06 DIAGNOSIS — H532 Diplopia: Secondary | ICD-10-CM | POA: Diagnosis not present

## 2022-10-06 DIAGNOSIS — H5053 Vertical heterophoria: Secondary | ICD-10-CM | POA: Diagnosis not present

## 2022-10-06 DIAGNOSIS — H524 Presbyopia: Secondary | ICD-10-CM | POA: Diagnosis not present

## 2022-10-06 DIAGNOSIS — H16223 Keratoconjunctivitis sicca, not specified as Sjogren's, bilateral: Secondary | ICD-10-CM | POA: Diagnosis not present

## 2022-10-11 ENCOUNTER — Ambulatory Visit (INDEPENDENT_AMBULATORY_CARE_PROVIDER_SITE_OTHER): Payer: Medicare PPO

## 2022-10-11 VITALS — Ht 65.0 in | Wt 186.0 lb

## 2022-10-11 DIAGNOSIS — Z0000 Encounter for general adult medical examination without abnormal findings: Secondary | ICD-10-CM

## 2022-10-11 DIAGNOSIS — Z13820 Encounter for screening for osteoporosis: Secondary | ICD-10-CM | POA: Diagnosis not present

## 2022-10-11 NOTE — Progress Notes (Signed)
Subjective:   Brandi Parker is a 66 y.o. female who presents for Medicare Annual (Subsequent) preventive examination.  Review of Systems    Virtual Visit via Telephone Note  I connected with  Brandi Parker on 10/11/22 at 12:30 PM EDT by telephone and verified that I am speaking with the correct person using two identifiers.  Location: Patient: Home Provider: Office Persons participating in the virtual visit: patient/Nurse Health Advisor   I discussed the limitations, risks, security and privacy concerns of performing an evaluation and management service by telephone and the availability of in person appointments. The patient expressed understanding and agreed to proceed.  Interactive audio and video telecommunications were attempted between this nurse and patient, however failed, due to patient having technical difficulties OR patient did not have access to video capability.  We continued and completed visit with audio only.  Some vital signs may be absent or patient reported.   Criselda Peaches, LPN  Cardiac Risk Factors include: advanced age (>60mn, >>24women);hypertension     Objective:    Today's Vitals   10/11/22 1244  Weight: 186 lb (84.4 kg)  Height: '5\' 5"'$  (1.651 m)   Body mass index is 30.95 kg/m.     10/11/2022   12:52 PM 02/20/2021    9:37 AM 02/23/2016    4:14 PM 09/08/2015    8:15 PM  Advanced Directives  Does Patient Have a Medical Advance Directive? No Yes No No  Type of Advance Directive  [place]   Would patient like information on creating a medical advance directive? No - Patient declined   No - patient declined information    Current Medications (verified) Outpatient Encounter Medications as of 10/11/2022  Medication Sig   albuterol (VENTOLIN HFA) 108 (90 Base) MCG/ACT inhaler SMARTSIG:1-2 Puff(s) By Mouth Every 4-6 Hours PRN   Calcium Carbonate 1500 MG TABS Take by mouth daily.   cetirizine (ZYRTEC) 10 MG tablet Take 10 mg  by mouth daily.   Cholecalciferol (VITAMIN D) 2000 UNITS tablet Take 2,000 Units by mouth daily.   ciprofloxacin (CIPRO) 500 MG tablet Take 1 tablet (500 mg total) by mouth 2 (two) times daily.   eletriptan (RELPAX) 40 MG tablet Take 1 tablet (40 mg total) by mouth as needed for migraine. may repeat in 2 hours if necessary   estradiol (VIVELLE-DOT) 0.025 MG/24HR Place 1 patch onto the skin once a week.   gabapentin (NEURONTIN) 300 MG capsule TAKE 1 CAPSULE BY MOUTH THREE TIMES A DAY   losartan (COZAAR) 50 MG tablet TAKE 1 TABLET BY MOUTH EVERY DAY   methocarbamol (ROBAXIN) 500 MG tablet TAKE 1 TABLET BY MOUTH TWICE DAILY AS NEEDED   Multiple Vitamins-Minerals (WOMENS 50+ ADVANCED PO) Take by mouth.   Naproxen Sodium 220 MG CAPS Take 2 capsules by mouth daily.   omeprazole (PRILOSEC) 20 MG capsule Take 1 capsule (20 mg total) by mouth daily.   oxybutynin (DITROPAN-XL) 10 MG 24 hr tablet TAKE 1 TABLET AT BEDTIME   phenazopyridine (PYRIDIUM) 200 MG tablet Take 1 tablet (200 mg total) by mouth 3 (three) times daily as needed for pain.   phentermine 37.5 MG capsule TAKE 1 CAPSULE BY MOUTH EVERY MORNING   progesterone (PROMETRIUM) 100 MG capsule Take 100 mg by mouth daily.   valACYclovir (VALTREX) 1000 MG tablet Take 1 tablet (1,000 mg total) by mouth as needed.   venlafaxine XR (EFFEXOR-XR) 150 MG 24 hr capsule TAKE 1 CAPSULE BY MOUTH DAILY  WITH BREAKFAST.   No facility-administered encounter medications on file as of 10/11/2022.    Allergies (verified) Coconut flavor, Penicillins, Sulfamethoxazole-trimethoprim, Strawberry flavor, and Sulfa antibiotics   History: Past Medical History:  Diagnosis Date   ADHD (attention deficit hyperactivity disorder)    Allergy    Anemia    Arthritis    Brain injury ([place]) 1981   fell off a desk chair   Chicken pox    Depression    Fracture, ankle    Fracture, toe    GERD (gastroesophageal reflux disease)    Menopausal syndrome    Migraines     Nephrolithiasis    Osteopenia 01/24/09   dexa scan   Sleep apnea    uses cpap 08/2020   Sleep apnea syndrome    could not tolerate CPAP   Urinary incontinence    Past Surgical History:  Procedure Laterality Date   CATARACT EXTRACTION, BILATERAL  04/2020   COLONOSCOPY  09/18/2020   per Dr. Henrene Pastor, adenmatous polyp, repeat in 7 yrs   DILATION AND CURETTAGE OF UTERUS     KNEE ARTHROSCOPY  12/16/2011   left knee, per Dr. Durward Fortes    KNEE ARTHROSCOPY Right 02/22/2018   per Dr. Dorna Leitz , 2 Left arthroscopy-2015&2016   REPAIR NONUNION / MALUNION METATARSAL / TARSAL BONES     right 5th   TONSILLECTOMY     Family History  Problem Relation Age of Onset   Diabetes Mother        father   Hyperlipidemia Mother    Hypertension Mother    Stroke Mother    Prostate cancer Father    Cancer Father        tumors in kidneys/tumors in liver   Hashimoto's thyroiditis Daughter    Hashimoto's thyroiditis Other        cousins x 2   Colon cancer Neg Hx    Colon polyps Neg Hx    Esophageal cancer Neg Hx    Rectal cancer Neg Hx    Stomach cancer Neg Hx    Social History   Socioeconomic History   Marital status: Divorced    Spouse name: Not on file   Number of children: Not on file   Years of education: Not on file   Highest education level: Master's degree (e.g., MA, MS, MEng, MEd, MSW, MBA)  Occupational History   Occupation: Pharmacist, hospital  Tobacco Use   Smoking status: Never   Smokeless tobacco: Never  Vaping Use   Vaping Use: Never used  Substance and Sexual Activity   Alcohol use: Yes    Alcohol/week: 0.0 standard drinks of alcohol    Comment: occ   Drug use: No   Sexual activity: Not on file  Other Topics Concern   Not on file  Social History Narrative   Not on file   Social Determinants of Health   Financial Resource Strain: Low Risk  (10/11/2022)   Overall Financial Resource Strain (CARDIA)    Difficulty of Paying Living Expenses: Not hard at all  Food Insecurity: No  Food Insecurity (10/11/2022)   Hunger Vital Sign    Worried About Running Out of Food in the Last Year: Never true    Ran Out of Food in the Last Year: Never true  Transportation Needs: No Transportation Needs (10/11/2022)   PRAPARE - Hydrologist (Medical): No    Lack of Transportation (Non-Medical): No  Physical Activity: Sufficiently Active (10/11/2022)   Exercise Vital Sign  Days of Exercise per Week: 3 days    Minutes of Exercise per Session: 120 min  Stress: No Stress Concern Present (10/11/2022)   [place]    Feeling of Stress : Not at all  Social Connections: Moderately Integrated (10/11/2022)   Social Connection and Isolation Panel [NHANES]    Frequency of Communication with Friends and Family: More than three times a week    Frequency of Social Gatherings with Friends and Family: More than three times a week    Attends Religious Services: More than 4 times per year    Active Member of [employer] or Organizations: Yes    Attends Music therapist: More than 4 times per year    Marital Status: Divorced    Tobacco Counseling Counseling given: Not Answered   Clinical Intake:  Pre-visit preparation completed: No  Pain : No/denies pain     BMI - recorded: 30.95 Nutritional Status: BMI > 30  Obese Nutritional Risks: None Diabetes: No  How often do you need to have someone help you when you read instructions, pamphlets, or other written materials from your doctor or pharmacy?: 1 - Never  Diabetic?  No  Interpreter Needed?: No  Information entered by :: Dhanvin Szeto LPN   Activities of Daily Living    10/11/2022   12:49 PM 10/27/2021    8:33 AM  In your present state of health, do you have any difficulty performing the following activities:  Hearing? 0 1  Comment  mild loss of hearing in both ears  Vision? 0 0  Difficulty concentrating or making  decisions? 0 0  Walking or climbing stairs? 0 0  Dressing or bathing? 0 0  Doing errands, shopping? 0 0  Preparing Food and eating ? N   Using the Toilet? N   In the past six months, have you accidently leaked urine? Y   Comment Wears pads followed by PCP   Do you have problems with loss of bowel control? N   Managing your Medications? N   Managing your Finances? N   Housekeeping or managing your Housekeeping? N     Patient Care Team: Laurey Morale, MD as PCP - General  Indicate any recent Medical Services you may have received from other than [facility] providers in the past year (date may be approximate).     Assessment:   This is a routine wellness examination for Brandi Parker.  Hearing/Vision screen Hearing Screening - Comments:: Denies hearing difficulties   Vision Screening - Comments:: Wears rx glasses - up to date with routine eye exams with  Dr Vladimir Faster  Dietary issues and exercise activities discussed: Current Exercise Habits: Home exercise routine, Type of exercise: walking, Time (Minutes): > 60, Frequency (Times/Week): 3, Weekly Exercise (Minutes/Week): 0, Exercise limited by: None identified   Goals Addressed               This Visit's Progress     Clean my house (pt-stated)         Depression Screen    10/11/2022   12:48 PM 06/04/2022    3:22 PM 05/12/2022   11:10 AM 09/15/2021    2:32 PM  PHQ 2/9 Scores  PHQ - 2 Score 0 '2 4 2  '$ PHQ- 9 Score  '15 13 12    '$ Fall Risk    10/11/2022   12:51 PM 06/04/2022    3:22 PM 05/12/2022   11:09 AM 10/27/2021    8:33  AM 10/27/2021    8:09 AM  Fall Risk   Falls in the past year? 1 0 0 1 1  Number falls in past yr: 0 0 0 0 0  Injury with Fall? 0 0 0 0 0  Risk for fall due to : No Fall Risks No Fall Risks No Fall Risks No Fall Risks   Follow up Falls prevention discussed Falls evaluation completed Falls evaluation completed      [place]:  Any stairs in or around the home? Yes  If so, are  there any without handrails? No  Home free of loose throw rugs in walkways, pet beds, electrical cords, etc? Yes  Adequate lighting in your home to reduce risk of falls? Yes   ASSISTIVE DEVICES UTILIZED TO PREVENT FALLS:  Life alert? No  Use of a cane, walker or w/c? No  Grab bars in the bathroom? No  Shower chair or bench in shower? Yes  Elevated toilet seat or a handicapped toilet? No   TIMED UP AND GO:  Was the test performed? No . Audio Visit  Cognitive Function:        10/11/2022   12:52 PM  6CIT Screen  What Year? 0 points  What month? 0 points  What time? 0 points  Count back from 20 0 points  Months in reverse 0 points  Repeat phrase 0 points  Total Score 0 points    Immunizations Immunization History  Administered Date(s) Administered   Influenza Split 10/13/2012, 10/17/2013, 10/09/2017   Influenza Whole 10/14/2010   Influenza,inj,Quad PF,6+ Mos 09/19/2014, 10/31/2015, 11/08/2016, 09/15/2021   PFIZER(Purple Top)SARS-COV-2 Vaccination 02/16/2020, 03/08/2020   PPD Test 10/06/2012   Pfizer Covid-19 Vaccine Bivalent Booster 76yr & up 10/24/2021   Td 10/14/2010    TDAP status: Due, Education has been provided regarding the importance of this vaccine. Advised may receive this vaccine at local pharmacy or Health Dept. Aware to provide a copy of the vaccination record if obtained from local pharmacy or Health Dept. Verbalized acceptance and understanding.  Flu Vaccine status: Up to date  Pneumococcal vaccine status: Due, Education has been provided regarding the importance of this vaccine. Advised may receive this vaccine at local pharmacy or Health Dept. Aware to provide a copy of the vaccination record if obtained from local pharmacy or Health Dept. Verbalized acceptance and understanding.  Covid-19 vaccine status: Completed vaccines  Qualifies for Shingles Vaccine? Yes   Zostavax completed No   Shingrix Completed?: No.    Education has been provided  regarding the importance of this vaccine. Patient has been advised to call insurance company to determine out of pocket expense if they have not yet received this vaccine. Advised may also receive vaccine at local pharmacy or Health Dept. Verbalized acceptance and understanding.  Screening Tests Health Maintenance  Topic Date Due   DEXA SCAN  Never done   TETANUS/TDAP  10/19/2022 (Originally 10/14/2020)   COVID-19 Vaccine (4 - Pfizer series) 10/27/2022 (Originally 02/21/2022)   Zoster Vaccines- Shingrix (1 of 2) 01/11/2023 (Originally 09/22/2006)   INFLUENZA VACCINE  03/20/2023 (Originally 07/20/2022)   Pneumonia Vaccine 66 Years old (1 - PCV) 10/12/2023 (Originally 09/22/2021)   Hepatitis C Screening  10/12/2023 (Originally 09/22/1974)   MAMMOGRAM  04/07/2024   COLONOSCOPY (Pts 45-445yrInsurance coverage will need to be confirmed)  09/19/2027   HPV VACCINES  Aged Out    Health Maintenance  Health Maintenance Due  Topic Date Due   DEXA SCAN  Never done    Colorectal cancer screening: Type of screening: Colonoscopy. Completed 09/18/20. Repeat every 7 years  Mammogram status: Completed 04/07/22. Repeat every year  Bone Density status: Ordered 09/21/22. Pt provided with contact info and advised to call to schedule appt.  Lung Cancer Screening: (Low Dose CT Chest recommended if Age 49-80 years, 30 pack-year currently smoking OR have quit w/in 15years.) does not qualify.     Additional Screening:  Hepatitis C Screening: does qualify; Completed Deferred  Vision Screening: Recommended annual ophthalmology exams for early detection of glaucoma and other disorders of the eye. Is the patient up to date with their annual eye exam?  Yes  Who is the provider or what is the name of the office in which the patient attends annual eye exams? Dr Vladimir Faster If pt is not established with a provider, would they like to be referred to a provider to establish care? No .   Dental Screening: Recommended annual  dental exams for proper oral hygiene  Community Resource Referral / Chronic Care Management:  CRR required this visit?  No   CCM required this visit?  No      Plan:     I have personally reviewed and noted the following in the patient's chart:   Medical and social history Use of alcohol, tobacco or illicit drugs  Current medications and supplements including opioid prescriptions. Patient is not currently taking opioid prescriptions. Functional ability and status Nutritional status Physical activity Advanced directives List of other physicians Hospitalizations, surgeries, and ER visits in previous 12 months Vitals Screenings to include cognitive, depression, and falls Referrals and appointments  In addition, I have reviewed and discussed with patient certain preventive protocols, quality metrics, and best practice recommendations. A written personalized care plan for preventive services as well as general preventive health recommendations were provided to patient.     Criselda Peaches, LPN   32/11/2481   Nurse Notes: Patient due Hep-C Screening

## 2022-10-11 NOTE — Patient Instructions (Addendum)
Brandi Parker , Thank you for taking time to come for your Medicare Wellness Visit. I appreciate your ongoing commitment to your health goals. Please review the following plan we discussed and let me know if I can assist you in the future.   These are the goals we discussed:  Goals       Clean my house (pt-stated)        This is a list of the screening recommended for you and due dates:  Health Maintenance  Topic Date Due   DEXA scan (bone density measurement)  Never done   Tetanus Vaccine  10/19/2022*   COVID-19 Vaccine (4 - Pfizer series) 10/27/2022*   Zoster (Shingles) Vaccine (1 of 2) 01/11/2023*   Flu Shot  03/20/2023*   Pneumonia Vaccine (1 - PCV) 10/12/2023*   Hepatitis C Screening: USPSTF Recommendation to screen - Ages 18-79 yo.  10/12/2023*   Mammogram  04/07/2024   Colon Cancer Screening  09/19/2027   HPV Vaccine  Aged Out  *Topic was postponed. The date shown is not the original due date.    Advanced directives: Advance directive discussed with you today. Even though you declined this today, please call our office should you change your mind, and we can give you the proper paperwork for you to fill out.   Conditions/risks identified: None  Next appointment: Follow up in one year for your annual wellness visit     Preventive Care 65 Years and Older, Female Preventive care refers to lifestyle choices and visits with your health care provider that can promote health and wellness. What does preventive care include? A yearly physical exam. This is also called an annual well check. Dental exams once or twice a year. Routine eye exams. Ask your health care provider how often you should have your eyes checked. Personal lifestyle choices, including: Daily care of your teeth and gums. Regular physical activity. Eating a healthy diet. Avoiding tobacco and drug use. Limiting alcohol use. Practicing safe sex. Taking low-dose aspirin every day. Taking vitamin and mineral  supplements as recommended by your health care provider. What happens during an annual well check? The services and screenings done by your health care provider during your annual well check will depend on your age, overall health, lifestyle risk factors, and family history of disease. Counseling  Your health care provider may ask you questions about your: Alcohol use. Tobacco use. Drug use. Emotional well-being. Home and relationship well-being. Sexual activity. Eating habits. History of falls. Memory and ability to understand (cognition). Work and work Statistician. Reproductive health. Screening  You may have the following tests or measurements: Height, weight, and BMI. Blood pressure. Lipid and cholesterol levels. These may be checked every 5 years, or more frequently if you are over 22 years old. Skin check. Lung cancer screening. You may have this screening every year starting at age 37 if you have a 30-pack-year history of smoking and currently smoke or have quit within the past 15 years. Fecal occult blood test (FOBT) of the stool. You may have this test every year starting at age 65. Flexible sigmoidoscopy or colonoscopy. You may have a sigmoidoscopy every 5 years or a colonoscopy every 10 years starting at age 52. Hepatitis C blood test. Hepatitis B blood test. Sexually transmitted disease (STD) testing. Diabetes screening. This is done by checking your blood sugar (glucose) after you have not eaten for a while (fasting). You may have this done every 1-3 years. Bone density scan. This is done to  screen for osteoporosis. You may have this done starting at age 90. Mammogram. This may be done every 1-2 years. Talk to your health care provider about how often you should have regular mammograms. Talk with your health care provider about your test results, treatment options, and if necessary, the need for more tests. Vaccines  Your health care provider may recommend certain  vaccines, such as: Influenza vaccine. This is recommended every year. Tetanus, diphtheria, and acellular pertussis (Tdap, Td) vaccine. You may need a Td booster every 10 years. Zoster vaccine. You may need this after age 57. Pneumococcal 13-valent conjugate (PCV13) vaccine. One dose is recommended after age 47. Pneumococcal polysaccharide (PPSV23) vaccine. One dose is recommended after age 76. Talk to your health care provider about which screenings and vaccines you need and how often you need them. This information is not intended to replace advice given to you by your health care provider. Make sure you discuss any questions you have with your health care provider. Document Released: 01/02/2016 Document Revised: 08/25/2016 Document Reviewed: 10/07/2015 Elsevier Interactive Patient Education  2017 [place] Prevention in the Home Falls can cause injuries. They can happen to people of all ages. There are many things you can do to make your home safe and to help prevent falls. What can I do on the outside of my home? Regularly fix the edges of walkways and driveways and fix any cracks. Remove anything that might make you trip as you walk through a door, such as a raised step or threshold. Trim any bushes or trees on the path to your home. Use bright outdoor lighting. Clear any walking paths of anything that might make someone trip, such as rocks or tools. Regularly check to see if handrails are loose or broken. Make sure that both sides of any steps have handrails. Any raised decks and porches should have guardrails on the edges. Have any leaves, snow, or ice cleared regularly. Use sand or salt on walking paths during winter. Clean up any spills in your garage right away. This includes oil or grease spills. What can I do in the bathroom? Use night lights. Install grab bars by the toilet and in the tub and shower. Do not use towel bars as grab bars. Use non-skid mats or decals in  the tub or shower. If you need to sit down in the shower, use a plastic, non-slip stool. Keep the floor dry. Clean up any water that spills on the floor as soon as it happens. Remove soap buildup in the tub or shower regularly. Attach bath mats securely with double-sided non-slip rug tape. Do not have throw rugs and other things on the floor that can make you trip. What can I do in the bedroom? Use night lights. Make sure that you have a light by your bed that is easy to reach. Do not use any sheets or blankets that are too big for your bed. They should not hang down onto the floor. Have a firm chair that has side arms. You can use this for support while you get dressed. Do not have throw rugs and other things on the floor that can make you trip. What can I do in the kitchen? Clean up any spills right away. Avoid walking on wet floors. Keep items that you use a lot in easy-to-reach places. If you need to reach something above you, use a strong step stool that has a grab bar. Keep electrical cords out of the way.  Do not use floor polish or wax that makes floors slippery. If you must use wax, use non-skid floor wax. Do not have throw rugs and other things on the floor that can make you trip. What can I do with my stairs? Do not leave any items on the stairs. Make sure that there are handrails on both sides of the stairs and use them. Fix handrails that are broken or loose. Make sure that handrails are as long as the stairways. Check any carpeting to make sure that it is firmly attached to the stairs. Fix any carpet that is loose or worn. Avoid having throw rugs at the top or bottom of the stairs. If you do have throw rugs, attach them to the floor with carpet tape. Make sure that you have a light switch at the top of the stairs and the bottom of the stairs. If you do not have them, ask someone to add them for you. What else can I do to help prevent falls? Wear shoes that: Do not have high  heels. Have rubber bottoms. Are comfortable and fit you well. Are closed at the toe. Do not wear sandals. If you use a stepladder: Make sure that it is fully opened. Do not climb a closed stepladder. Make sure that both sides of the stepladder are locked into place. Ask someone to hold it for you, if possible. Clearly mark and make sure that you can see: Any grab bars or handrails. First and last steps. Where the edge of each step is. Use tools that help you move around (mobility aids) if they are needed. These include: Canes. Walkers. Scooters. Crutches. Turn on the lights when you go into a dark area. Replace any light bulbs as soon as they burn out. Set up your furniture so you have a clear path. Avoid moving your furniture around. If any of your floors are uneven, fix them. If there are any pets around you, be aware of where they are. Review your medicines with your doctor. Some medicines can make you feel dizzy. This can increase your chance of falling. Ask your doctor what other things that you can do to help prevent falls. This information is not intended to replace advice given to you by your health care provider. Make sure you discuss any questions you have with your health care provider. Document Released: 10/02/2009 Document Revised: 05/13/2016 Document Reviewed: 01/10/2015 Elsevier Interactive Patient Education  2017 [employer].

## 2022-10-21 DIAGNOSIS — J3081 Allergic rhinitis due to animal (cat) (dog) hair and dander: Secondary | ICD-10-CM | POA: Diagnosis not present

## 2022-10-21 DIAGNOSIS — J301 Allergic rhinitis due to pollen: Secondary | ICD-10-CM | POA: Diagnosis not present

## 2022-10-21 DIAGNOSIS — J3089 Other allergic rhinitis: Secondary | ICD-10-CM | POA: Diagnosis not present

## 2022-10-28 DIAGNOSIS — J301 Allergic rhinitis due to pollen: Secondary | ICD-10-CM | POA: Diagnosis not present

## 2022-10-28 DIAGNOSIS — J3081 Allergic rhinitis due to animal (cat) (dog) hair and dander: Secondary | ICD-10-CM | POA: Diagnosis not present

## 2022-10-28 DIAGNOSIS — J3089 Other allergic rhinitis: Secondary | ICD-10-CM | POA: Diagnosis not present

## 2022-11-01 ENCOUNTER — Other Ambulatory Visit: Payer: Self-pay | Admitting: Family Medicine

## 2022-11-05 DIAGNOSIS — H5053 Vertical heterophoria: Secondary | ICD-10-CM | POA: Diagnosis not present

## 2022-11-16 DIAGNOSIS — J3081 Allergic rhinitis due to animal (cat) (dog) hair and dander: Secondary | ICD-10-CM | POA: Diagnosis not present

## 2022-11-16 DIAGNOSIS — H1045 Other chronic allergic conjunctivitis: Secondary | ICD-10-CM | POA: Diagnosis not present

## 2022-11-16 DIAGNOSIS — J301 Allergic rhinitis due to pollen: Secondary | ICD-10-CM | POA: Diagnosis not present

## 2022-11-16 DIAGNOSIS — R062 Wheezing: Secondary | ICD-10-CM | POA: Diagnosis not present

## 2022-11-16 DIAGNOSIS — J3089 Other allergic rhinitis: Secondary | ICD-10-CM | POA: Diagnosis not present

## 2022-11-24 ENCOUNTER — Other Ambulatory Visit: Payer: Self-pay | Admitting: Family Medicine

## 2022-11-26 DIAGNOSIS — J301 Allergic rhinitis due to pollen: Secondary | ICD-10-CM | POA: Diagnosis not present

## 2022-11-26 DIAGNOSIS — J3081 Allergic rhinitis due to animal (cat) (dog) hair and dander: Secondary | ICD-10-CM | POA: Diagnosis not present

## 2022-11-26 DIAGNOSIS — J3089 Other allergic rhinitis: Secondary | ICD-10-CM | POA: Diagnosis not present

## 2022-12-02 ENCOUNTER — Other Ambulatory Visit: Payer: Self-pay | Admitting: Family Medicine

## 2022-12-03 DIAGNOSIS — J3089 Other allergic rhinitis: Secondary | ICD-10-CM | POA: Diagnosis not present

## 2022-12-03 DIAGNOSIS — J3081 Allergic rhinitis due to animal (cat) (dog) hair and dander: Secondary | ICD-10-CM | POA: Diagnosis not present

## 2022-12-03 DIAGNOSIS — J301 Allergic rhinitis due to pollen: Secondary | ICD-10-CM | POA: Diagnosis not present

## 2022-12-10 DIAGNOSIS — J3081 Allergic rhinitis due to animal (cat) (dog) hair and dander: Secondary | ICD-10-CM | POA: Diagnosis not present

## 2022-12-10 DIAGNOSIS — J3089 Other allergic rhinitis: Secondary | ICD-10-CM | POA: Diagnosis not present

## 2022-12-10 DIAGNOSIS — J301 Allergic rhinitis due to pollen: Secondary | ICD-10-CM | POA: Diagnosis not present

## 2022-12-17 DIAGNOSIS — J3081 Allergic rhinitis due to animal (cat) (dog) hair and dander: Secondary | ICD-10-CM | POA: Diagnosis not present

## 2022-12-17 DIAGNOSIS — J301 Allergic rhinitis due to pollen: Secondary | ICD-10-CM | POA: Diagnosis not present

## 2022-12-17 DIAGNOSIS — J3089 Other allergic rhinitis: Secondary | ICD-10-CM | POA: Diagnosis not present

## 2022-12-24 DIAGNOSIS — J3081 Allergic rhinitis due to animal (cat) (dog) hair and dander: Secondary | ICD-10-CM | POA: Diagnosis not present

## 2022-12-24 DIAGNOSIS — J301 Allergic rhinitis due to pollen: Secondary | ICD-10-CM | POA: Diagnosis not present

## 2022-12-24 DIAGNOSIS — J3089 Other allergic rhinitis: Secondary | ICD-10-CM | POA: Diagnosis not present

## 2022-12-27 DIAGNOSIS — G4733 Obstructive sleep apnea (adult) (pediatric): Secondary | ICD-10-CM | POA: Diagnosis not present

## 2022-12-29 ENCOUNTER — Telehealth: Payer: Self-pay | Admitting: Family Medicine

## 2022-12-29 MED ORDER — ACYCLOVIR 5 % EX OINT: 1.0000 | TOPICAL_OINTMENT | CUTANEOUS | 5 refills | Status: DC

## 2022-12-29 NOTE — Addendum Note (Signed)
Addended by: Alysia Penna A on: 12/29/2022 05:00 PM   Modules accepted: Orders

## 2022-12-29 NOTE — Telephone Encounter (Signed)
Done

## 2022-12-29 NOTE — Telephone Encounter (Signed)
Pt notified via My Chart

## 2022-12-29 NOTE — Telephone Encounter (Signed)
Requesting cream in addition to valACYclovir (VALTREX) 1000 MG tablet   [id number] [place], [place] - [address]: [phone] Fax: [phone]

## 2022-12-29 NOTE — Telephone Encounter (Signed)
Last refill-07/08/20 Requesting cream in addition to Valtrex refill.

## 2022-12-30 NOTE — Telephone Encounter (Signed)
Apparently insurance will not cover both of these at the same time. Both RX are at the pharmacy. She can decide which one she wants to fill and we can cancel the other one

## 2022-12-30 NOTE — Telephone Encounter (Signed)
Patient calling stating the actual valACYclovir (VALTREX) 1000 MG tablets were not prescribed, just the cream. Patient wanted the cream and the tablets.  [id number] [place], [place] [address]: [phone] Fax: [phone]   Says insurance needs justification for the cream.

## 2022-12-30 NOTE — Telephone Encounter (Signed)
Spoke with patient about message.    Will contact pharmacy to decide which prescription she will fill at this time.

## 2023-01-01 DIAGNOSIS — M1711 Unilateral primary osteoarthritis, right knee: Secondary | ICD-10-CM | POA: Diagnosis not present

## 2023-01-01 DIAGNOSIS — M545 Low back pain, unspecified: Secondary | ICD-10-CM | POA: Diagnosis not present

## 2023-01-01 DIAGNOSIS — M1712 Unilateral primary osteoarthritis, left knee: Secondary | ICD-10-CM | POA: Diagnosis not present

## 2023-01-03 DIAGNOSIS — J301 Allergic rhinitis due to pollen: Secondary | ICD-10-CM | POA: Diagnosis not present

## 2023-01-03 DIAGNOSIS — J3081 Allergic rhinitis due to animal (cat) (dog) hair and dander: Secondary | ICD-10-CM | POA: Diagnosis not present

## 2023-01-03 DIAGNOSIS — J3089 Other allergic rhinitis: Secondary | ICD-10-CM | POA: Diagnosis not present

## 2023-01-07 ENCOUNTER — Other Ambulatory Visit: Payer: Self-pay

## 2023-01-07 MED ORDER — VALACYCLOVIR HCL 1 G PO TABS: 1000.0000 mg | ORAL_TABLET | ORAL | 5 refills | Status: DC | PRN

## 2023-01-11 ENCOUNTER — Ambulatory Visit: Payer: Medicare PPO | Admitting: Family Medicine

## 2023-01-11 ENCOUNTER — Encounter: Payer: Self-pay | Admitting: Family Medicine

## 2023-01-11 VITALS — BP 130/88 | HR 85 | Temp 97.9°F | Ht 65.0 in | Wt 192.0 lb

## 2023-01-11 DIAGNOSIS — R739 Hyperglycemia, unspecified: Secondary | ICD-10-CM

## 2023-01-11 DIAGNOSIS — M170 Bilateral primary osteoarthritis of knee: Secondary | ICD-10-CM | POA: Diagnosis not present

## 2023-01-11 DIAGNOSIS — R251 Tremor, unspecified: Secondary | ICD-10-CM

## 2023-01-11 DIAGNOSIS — F418 Other specified anxiety disorders: Secondary | ICD-10-CM | POA: Diagnosis not present

## 2023-01-11 DIAGNOSIS — D649 Anemia, unspecified: Secondary | ICD-10-CM

## 2023-01-11 DIAGNOSIS — I10 Essential (primary) hypertension: Secondary | ICD-10-CM

## 2023-01-11 DIAGNOSIS — J3081 Allergic rhinitis due to animal (cat) (dog) hair and dander: Secondary | ICD-10-CM | POA: Diagnosis not present

## 2023-01-11 DIAGNOSIS — J3089 Other allergic rhinitis: Secondary | ICD-10-CM | POA: Diagnosis not present

## 2023-01-11 DIAGNOSIS — J301 Allergic rhinitis due to pollen: Secondary | ICD-10-CM | POA: Diagnosis not present

## 2023-01-11 LAB — HEPATIC FUNCTION PANEL
ALT: 17 U/L (ref 0–35)
AST: 20 U/L (ref 0–37)
Albumin: 4 g/dL (ref 3.5–5.2)
Alkaline Phosphatase: 92 U/L (ref 39–117)
Bilirubin, Direct: 0.1 mg/dL (ref 0.0–0.3)
Total Bilirubin: 0.2 mg/dL (ref 0.2–1.2)
Total Protein: 7 g/dL (ref 6.0–8.3)

## 2023-01-11 LAB — BASIC METABOLIC PANEL
BUN: 28 mg/dL — ABNORMAL HIGH (ref 6–23)
CO2: 31 mEq/L (ref 19–32)
Calcium: 9.7 mg/dL (ref 8.4–10.5)
Chloride: 106 mEq/L (ref 96–112)
Creatinine, Ser: 0.9 mg/dL (ref 0.40–1.20)
GFR: 66.71 mL/min (ref >60.00)
Glucose, Bld: 121 mg/dL — ABNORMAL HIGH (ref 70–99)
Potassium: 4.8 mEq/L (ref 3.5–5.1)
Sodium: 143 mEq/L (ref 135–145)

## 2023-01-11 LAB — CBC WITH DIFFERENTIAL/PLATELET
Basophils Absolute: 0.1 10*3/uL (ref 0.0–0.1)
Basophils Relative: 0.7 % (ref 0.0–3.0)
Eosinophils Absolute: 0.4 10*3/uL (ref 0.0–0.7)
Eosinophils Relative: 4.1 % (ref 0.0–5.0)
HCT: 36.8 % (ref 36.0–46.0)
Hemoglobin: 11.8 g/dL — ABNORMAL LOW (ref 12.0–15.0)
Lymphocytes Relative: 25.5 % (ref 12.0–46.0)
Lymphs Abs: 2.2 10*3/uL (ref 0.7–4.0)
MCHC: 32 g/dL (ref 30.0–36.0)
MCV: 78.4 fl (ref 78.0–100.0)
Monocytes Absolute: 0.9 10*3/uL (ref 0.1–1.0)
Monocytes Relative: 10.7 % (ref 3.0–12.0)
Neutro Abs: 5.1 10*3/uL (ref 1.4–7.7)
Neutrophils Relative %: 59 % (ref 43.0–77.0)
Platelets: 323 10*3/uL (ref 150.0–400.0)
RBC: 4.69 Mil/uL (ref 3.87–5.11)
RDW: 15.1 % (ref 11.5–15.5)
WBC: 8.7 10*3/uL (ref 4.0–10.5)

## 2023-01-11 LAB — HEMOGLOBIN A1C: Hgb A1c MFr Bld: 6.2 % (ref 4.6–6.5)

## 2023-01-11 LAB — TSH: TSH: 2.34 u[IU]/mL (ref 0.35–5.50)

## 2023-01-11 NOTE — Progress Notes (Signed)
   Subjective:    Patient ID: Brandi Parker, female    DOB: 03/24/1956, 67 y.o.   MRN: [id number]  HPI    Review of Systems     Objective:   Physical Exam        Assessment & Plan:

## 2023-01-11 NOTE — Progress Notes (Signed)
   Subjective:    Patient ID: Brandi Parker, female    DOB: 1956/10/25, 67 y.o.   MRN: [id number]  HPI Here for surgical clearance. She is hoping to have a left knee total arthroplasty per Dr. Dorna Leitz sometime soon. Other than her knee pain she feels fine. Her BP is stable. Her depression and anxiety are well controlled. She does not take any blood thinner medications.    Review of Systems  Constitutional: Negative.   Respiratory: Negative.    Cardiovascular: Negative.   Gastrointestinal: Negative.   Genitourinary: Negative.   Musculoskeletal:  Positive for arthralgias.       Objective:   Physical Exam Constitutional:      Appearance: Normal appearance.  Cardiovascular:     Rate and Rhythm: Normal rate and regular rhythm.     Pulses: Normal pulses.     Heart sounds: Normal heart sounds.  Pulmonary:     Effort: Pulmonary effort is normal.     Breath sounds: Normal breath sounds.  Abdominal:     General: Abdomen is flat. Bowel sounds are normal. There is no distension.     Palpations: Abdomen is soft. There is no mass.     Tenderness: There is no abdominal tenderness. There is no guarding or rebound.     Hernia: No hernia is present.  Musculoskeletal:     Right lower leg: No edema.     Left lower leg: No edema.  Neurological:     General: No focal deficit present.     Mental Status: She is alert and oriented to person, place, and time.           Assessment & Plan:  Surgical clearance exam. We will send her for labs today. We will make a determination after these results are back. We spent a total of ( 31  ) minutes reviewing records and discussing these issues.  Alysia Penna, MD  Alysia Penna, MD

## 2023-01-13 ENCOUNTER — Telehealth: Payer: Self-pay | Admitting: Primary Care

## 2023-01-13 NOTE — Telephone Encounter (Signed)
Fax received from Dr. Lowella Petties with [facility] to perform a LEFT TOTAL KNEE ARTHROPLASTY WITH SPINAL ANESTHESIA on patient.  Patient needs surgery clearance. Surgery is TBD. Patient was seen on 11/24/2021. Office protocol is a risk assessment can be sent to surgeon if patient has been seen in 60 days or less.   Sending to [name] NP for risk assessment or recommendations if patient needs to be seen in office prior to surgical procedure.    Patient has follow up with Beth on 01/19/2023 for surgical clearance.

## 2023-01-14 ENCOUNTER — Telehealth: Payer: Self-pay

## 2023-01-14 ENCOUNTER — Other Ambulatory Visit (HOSPITAL_COMMUNITY): Payer: Self-pay

## 2023-01-14 NOTE — Telephone Encounter (Signed)
Pt Surgical Clearance forms have been completed and faxed to [place]. Copy sent to scanning

## 2023-01-14 NOTE — Telephone Encounter (Signed)
Please advise 

## 2023-01-14 NOTE — Telephone Encounter (Signed)
Per test claim, step therapy of oral acyclovir or famciclovir required. No PA submitted at this time.

## 2023-01-17 ENCOUNTER — Telehealth: Payer: Self-pay | Admitting: Family Medicine

## 2023-01-17 NOTE — Telephone Encounter (Signed)
Pt was seen on 01-11-2023 and now having burning when urinating and would like a order to have urine test only

## 2023-01-18 NOTE — Telephone Encounter (Signed)
Yes please order a UA

## 2023-01-18 NOTE — Telephone Encounter (Signed)
Noted. Cancel the Valacyclovir and call in Acyclovir 800 mg to take TID as needed, #60 with 5 rf

## 2023-01-19 ENCOUNTER — Other Ambulatory Visit (INDEPENDENT_AMBULATORY_CARE_PROVIDER_SITE_OTHER): Payer: Medicare PPO

## 2023-01-19 ENCOUNTER — Ambulatory Visit: Payer: Medicare PPO | Admitting: Primary Care

## 2023-01-19 ENCOUNTER — Other Ambulatory Visit: Payer: Self-pay

## 2023-01-19 ENCOUNTER — Encounter: Payer: Self-pay | Admitting: Primary Care

## 2023-01-19 VITALS — BP 132/68 | HR 94 | Temp 98.0°F | Ht 64.5 in | Wt 192.8 lb

## 2023-01-19 DIAGNOSIS — J3089 Other allergic rhinitis: Secondary | ICD-10-CM | POA: Diagnosis not present

## 2023-01-19 DIAGNOSIS — J3081 Allergic rhinitis due to animal (cat) (dog) hair and dander: Secondary | ICD-10-CM | POA: Diagnosis not present

## 2023-01-19 DIAGNOSIS — G4731 Primary central sleep apnea: Secondary | ICD-10-CM | POA: Diagnosis not present

## 2023-01-19 DIAGNOSIS — Z01811 Encounter for preprocedural respiratory examination: Secondary | ICD-10-CM

## 2023-01-19 DIAGNOSIS — R300 Dysuria: Secondary | ICD-10-CM

## 2023-01-19 DIAGNOSIS — J309 Allergic rhinitis, unspecified: Secondary | ICD-10-CM

## 2023-01-19 DIAGNOSIS — J301 Allergic rhinitis due to pollen: Secondary | ICD-10-CM | POA: Diagnosis not present

## 2023-01-19 LAB — URINALYSIS, ROUTINE W REFLEX MICROSCOPIC
Bilirubin Urine: NEGATIVE
Hgb urine dipstick: NEGATIVE
Ketones, ur: NEGATIVE
Nitrite: NEGATIVE
RBC / HPF: NONE SEEN (ref 0–?)
Specific Gravity, Urine: 1.015 (ref 1.000–1.030)
Total Protein, Urine: NEGATIVE
Urine Glucose: NEGATIVE
Urobilinogen, UA: 0.2 (ref 0.0–1.0)
pH: 7 (ref 5.0–8.0)

## 2023-01-19 MED ORDER — ACYCLOVIR 800 MG PO TABS: 800.0000 mg | ORAL_TABLET | Freq: Three times a day (TID) | ORAL | 5 refills | Status: DC | PRN

## 2023-01-19 NOTE — Assessment & Plan Note (Signed)
-  Patient is moderately compliant with CPAP use. Average usage days used 3 hours 40 mins. Current CPAP pressure 5-15cmh20; Residual AHI 1.9/hour. Encourage patient aim to wear CPAP every night min 4 hours or more.

## 2023-01-19 NOTE — Patient Instructions (Signed)
Considered moderate risk for prolonged mechanical ventilation d/t severe OSA. Form pulmonary standpoint you are optimized for surgery, ultimate clearance will come from surgeon   Recommendations: -  Please wear CPAP more consistently; aim to wear every night 4-6 hours or more  - Bring CPAP day or surgery  Follow-up: - 6 months with Dr. Halford Chessman   CPAP and BIPAP Information CPAP and BIPAP are methods that use air pressure to keep your airways open and to help you breathe well. CPAP and BIPAP use different amounts of pressure. Your health care provider will tell you whether CPAP or BIPAP would be more helpful for you. CPAP stands for "continuous positive airway pressure." With CPAP, the amount of pressure stays the same while you breathe in (inhale) and out (exhale). BIPAP stands for "bi-level positive airway pressure." With BIPAP, the amount of pressure will be higher when you inhale and lower when you exhale. This allows you to take larger breaths. CPAP or BIPAP may be used in the hospital, or your health care provider may want you to use it at home. You may need to have a sleep study before your health care provider can order a machine for you to use at home. What are the advantages? CPAP or BIPAP can be helpful if you have: Sleep apnea. Chronic obstructive pulmonary disease (COPD). Heart failure. Medical conditions that cause muscle weakness, including muscular dystrophy or amyotrophic lateral sclerosis (ALS). Other problems that cause breathing to be shallow, weak, abnormal, or difficult. CPAP and BIPAP are most commonly used for obstructive sleep apnea (OSA) to keep the airways from collapsing when the muscles relax during sleep. What are the risks? Generally, this is a safe treatment. However, problems may occur, including: Irritated skin or skin sores if the mask does not fit properly. Dry or stuffy nose or nosebleeds. Dry mouth. Feeling gassy or bloated. Sinus or lung infection if the  equipment is not cleaned properly. When should CPAP or BIPAP be used? In most cases, the mask only needs to be worn during sleep. Generally, the mask needs to be worn throughout the night and during any daytime naps. People with certain medical conditions may also need to wear the mask at other times, such as when they are awake. Follow instructions from your health care provider about when to use the machine. What happens during CPAP or BIPAP?  Both CPAP and BIPAP are provided by a small machine with a flexible plastic tube that attaches to a plastic mask that you wear. Air is blown through the mask into your nose or mouth. The amount of pressure that is used to blow the air can be adjusted on the machine. Your health care provider will set the pressure setting and help you find the best mask for you. Tips for using the mask Because the mask needs to be snug, some people feel trapped or closed-in (claustrophobic) when first using the mask. If you feel this way, you may need to get used to the mask. One way to do this is to hold the mask loosely over your nose or mouth and then gradually apply the mask more snugly. You can also gradually increase the amount of time that you use the mask. Masks are available in various types and sizes. If your mask does not fit well, talk with your health care provider about getting a different one. Some common types of masks include: Full face masks, which fit over the mouth and nose. Nasal masks, which fit over  the nose. Nasal pillow or prong masks, which fit into the nostrils. If you are using a mask that fits over your nose and you tend to breathe through your mouth, a chin strap may be applied to help keep your mouth closed. Use a skin barrier to protect your skin as told by your health care provider. Some CPAP and BIPAP machines have alarms that may sound if the mask comes off or develops a leak. If you have trouble with the mask, it is very important that you  talk with your health care provider about finding a way to make the mask easier to tolerate. Do not stop using the mask. There could be a negative impact on your health if you stop using the mask. Tips for using the machine Place your CPAP or BIPAP machine on a secure table or stand near an electrical outlet. Know where the on/off switch is on the machine. Follow instructions from your health care provider about how to set the pressure on your machine and when you should use it. Do not eat or drink while the CPAP or BIPAP machine is on. Food or fluids could get pushed into your lungs by the pressure of the CPAP or BIPAP. For home use, CPAP and BIPAP machines can be rented or purchased through home health care companies. Many different brands of machines are available. Renting a machine before purchasing may help you find out which particular machine works well for you. Your health insurance company may also decide which machine you may get. Keep the CPAP or BIPAP machine and attachments clean. Ask your health care provider for specific instructions. Check the humidifier if you have a dry stuffy nose or nosebleeds. Make sure it is working correctly. Follow these instructions at home: Take over-the-counter and prescription medicines only as told by your health care provider. Ask if you can take sinus medicine if your sinuses are blocked. Do not use any products that contain nicotine or tobacco. These products include cigarettes, chewing tobacco, and vaping devices, such as e-cigarettes. If you need help quitting, ask your health care provider. Keep all follow-up visits. This is important. Contact a health care provider if: You have redness or pressure sores on your head, face, mouth, or nose from the mask or head gear. You have trouble using the CPAP or BIPAP machine. You cannot tolerate wearing the CPAP or BIPAP mask. Someone tells you that you snore even when wearing your CPAP or BIPAP. Get help  right away if: You have trouble breathing. You feel confused. Summary CPAP and BIPAP are methods that use air pressure to keep your airways open and to help you breathe well. If you have trouble with the mask, it is very important that you talk with your health care provider about finding a way to make the mask easier to tolerate. Do not stop using the mask. There could be a negative impact to your health if you stop using the mask. Follow instructions from your health care provider about when to use the machine. This information is not intended to replace advice given to you by your health care provider. Make sure you discuss any questions you have with your health care provider. Document Revised: 07/15/2021 Document Reviewed: 11/14/2020 Elsevier Patient Education  [place].

## 2023-01-19 NOTE — Progress Notes (Signed)
$'@Patient'B$  ID: Brandi Parker, female    DOB: [birth year], 67 y.o.   MRN: [id number]  Chief Complaint  Patient presents with   Follow-up    Surgical Clearance. Surgery date unknown    Referring provider: Laurey Morale, MD  HPI: 67 year old female, never smoked.  Past medical history significant for hypertension, allergic rhinitis, complex sleep apnea, ADHD, obesity.  01/19/2023 Patient presents today for surgical risk assessment. Hx sleep apnea. Planning for total left knee with [facility], date to be determined. Patient has severe sleep apnea. HST on 01/22/22 >> AHI 61/hour. She is moderately compliant with CPAP use. She will fall asleep some nights while watching TV before putting CPAP. She also has a second CPAP machine that she uses when no sleeping a home. No issus with mask fit or pressure settings. Sleeping well at night. Dr. Harold Hedge with allergy started her on Symbicort to use as needed for reactive airway. She is on '10mg'$  Zyrtec daily for a allergic rhintiis. She has no acute respiratory symptoms today.   Airview download 10/21/22-01/18/23 Usage 56/90 days (62%); 22 days (24%) > 4 hours Average usage days used 3 hours 40 mins Pressure 5-15cm h20 (12.8cmh20- 95) Airleaks 15.7L/min AHI 1.9   Allergies  Allergen Reactions   Coconut Flavor Swelling    coconut   Penicillins Swelling    REACTION: anaphylaxis REACTION: anaphylaxis   Sulfamethoxazole-Trimethoprim Swelling    REACTION: rash REACTION: rash   Strawberry Flavor     All berries   Sulfa Antibiotics     Other reaction(s): Unknown    Immunization History  Administered Date(s) Administered   Influenza Split 10/13/2012, 10/17/2013, 10/09/2017   Influenza Whole 10/14/2010   Influenza,inj,Quad PF,6+ Mos 09/19/2014, 10/31/2015, 11/08/2016, 09/15/2021   PFIZER(Purple Top)SARS-COV-2 Vaccination 02/16/2020, 03/08/2020   PPD Test 10/06/2012   Pfizer Covid-19 Vaccine Bivalent Booster 30yr & up  10/24/2021   Td 10/14/2010    Past Medical History:  Diagnosis Date   ADHD (attention deficit hyperactivity disorder)    Allergy    Anemia    Arthritis    [place]   fell off a desk chair   Chicken pox    Depression    Fracture, ankle    Fracture, toe    GERD (gastroesophageal reflux disease)    Menopausal syndrome    Migraines    Nephrolithiasis    Osteopenia 01/24/09   dexa scan   Sleep apnea    uses cpap 08/2020   Sleep apnea syndrome    could not tolerate CPAP   Urinary incontinence     Tobacco History: Social History   Tobacco Use  Smoking Status Never  Smokeless Tobacco Never   Counseling given: Not Answered   Outpatient Medications Prior to Visit  Medication Sig Dispense Refill   acyclovir (ZOVIRAX) 800 MG tablet Take 1 tablet (800 mg total) by mouth 3 (three) times daily as needed. 60 tablet 5   acyclovir ointment (ZOVIRAX) 5 % Apply 1 Application topically every 3 (three) hours. 30 g 5   albuterol (VENTOLIN HFA) 108 (90 Base) MCG/ACT inhaler SMARTSIG:1-2 Puff(s) By Mouth Every 4-6 Hours PRN     Calcium Carbonate 1500 MG TABS Take by mouth daily.     cetirizine (ZYRTEC) 10 MG tablet Take 10 mg by mouth daily.     Cholecalciferol (VITAMIN D) 2000 UNITS tablet Take 2,000 Units by mouth daily.     estradiol (VIVELLE-DOT) 0.025 MG/24HR Place 1 patch onto the skin once  a week.     gabapentin (NEURONTIN) 300 MG capsule TAKE 1 CAPSULE BY MOUTH THREE TIMES A DAY 90 capsule 2   losartan (COZAAR) 50 MG tablet TAKE 1 TABLET BY MOUTH EVERY DAY 30 tablet 2   Multiple Vitamins-Minerals (WOMENS 50+ ADVANCED PO) Take by mouth.     Naproxen Sodium 220 MG CAPS Take 2 capsules by mouth daily.     omeprazole (PRILOSEC) 20 MG capsule Take 1 capsule (20 mg total) by mouth daily. 30 capsule 3   oxybutynin (DITROPAN-XL) 10 MG 24 hr tablet TAKE 1 TABLET BY MOUTH EVERYDAY AT BEDTIME 90 tablet 0   phentermine 37.5 MG capsule TAKE 1 CAPSULE BY MOUTH EVERY MORNING 30  capsule 5   progesterone (PROMETRIUM) 100 MG capsule Take 100 mg by mouth daily.     SYMBICORT 80-4.5 MCG/ACT inhaler SMARTSIG:2 Puff(s) By Mouth Daily PRN     venlafaxine XR (EFFEXOR-XR) 150 MG 24 hr capsule TAKE 1 CAPSULE BY MOUTH DAILY WITH BREAKFAST. 30 capsule 2   eletriptan (RELPAX) 40 MG tablet Take 1 tablet (40 mg total) by mouth as needed for migraine. may repeat in 2 hours if necessary (Patient not taking: Reported on 01/19/2023) 6 tablet 11   methocarbamol (ROBAXIN) 500 MG tablet TAKE 1 TABLET BY MOUTH TWICE DAILY AS NEEDED (Patient not taking: Reported on 01/19/2023) 30 tablet 0   No facility-administered medications prior to visit.    Review of Systems  Review of Systems  Constitutional: Negative.   HENT: Negative.    Respiratory: Negative.    Cardiovascular: Negative.      Physical Exam  BP 132/68 (BP Location: Right Arm, Patient Position: Sitting, Cuff Size: Normal)   Pulse 94   Temp 98 F (36.7 C) (Oral)   Ht 5' 4.5" (1.638 m)   Wt 192 lb 12.8 oz (87.5 kg)   SpO2 97%   BMI 32.58 kg/m  Physical Exam Constitutional:      Appearance: Normal appearance.  HENT:     Head: Normocephalic and atraumatic.     Mouth/Throat:     Mouth: Mucous membranes are moist.     Pharynx: Oropharynx is clear.  Cardiovascular:     Rate and Rhythm: Normal rate and regular rhythm.  Pulmonary:     Effort: Pulmonary effort is normal.     Breath sounds: Normal breath sounds.  Musculoskeletal:     Cervical back: Normal range of motion and neck supple.  Skin:    General: Skin is warm and dry.  Neurological:     General: No focal deficit present.     Mental Status: She is alert and oriented to person, place, and time. Mental status is at baseline.  Psychiatric:        Mood and Affect: Mood normal.        Behavior: Behavior normal.        Thought Content: Thought content normal.        Judgment: Judgment normal.      Lab Results:  CBC    Component Value Date/Time   WBC 8.7  01/11/2023 0904   RBC 4.69 01/11/2023 0904   HGB 11.8 (L) 01/11/2023 0904   HCT 36.8 01/11/2023 0904   PLT 323.0 01/11/2023 0904   MCV 78.4 01/11/2023 0904   MCH 26.7 (L) 07/08/2020 0913   MCHC 32.0 01/11/2023 0904   RDW 15.1 01/11/2023 0904   LYMPHSABS 2.2 01/11/2023 0904   MONOABS 0.9 01/11/2023 0904   EOSABS 0.4 01/11/2023 0904  BASOSABS 0.1 01/11/2023 0904    BMET    Component Value Date/Time   NA 143 01/11/2023 0904   K 4.8 01/11/2023 0904   CL 106 01/11/2023 0904   CO2 31 01/11/2023 0904   GLUCOSE 121 (H) 01/11/2023 0904   BUN 28 (H) 01/11/2023 0904   CREATININE 0.90 01/11/2023 0904   CREATININE 0.95 07/08/2020 0913   CALCIUM 9.7 01/11/2023 0904   GFRNONAA 68.48 09/11/2010 0814    BNP No results found for: "BNP"  ProBNP No results found for: "PROBNP"  Imaging: No results found.   Assessment & Plan:   Complex sleep apnea syndrome - Patient is moderately compliant with CPAP use. Average usage days used 3 hours 40 mins. Current CPAP pressure 5-15cmh20; Residual AHI 1.9/hour. Encourage patient aim to wear CPAP every night min 4 hours or more.   Allergic rhinitis - Following with Allergy. Taking Zyrtec '10mg'$  daily. Given Symbicort 79mg to use as needed for reactive airways.   Encounter for pre-operative respiratory clearance Patient is considered moderate risk for prolonged mechanical ventilation and/or post op pulmonary complications due to history of severe sleep apnea.  She is moderately compliant with CPAP, auto settings 5 to 15 cm H2O.  Advised patient bring CPAP with her day of surgery.  Encourage better compliance with CPAP use, advised she aim to wear 4 to 6 hours every night. She is optimized for surgery from pulmonary standpoint. Ultimate clearance will be decided upon by surgeon.   Major Pulmonary risks identified in the multifactorial risk analysis are but not limited to a) pneumonia; b) recurrent intubation risk; c) prolonged or recurrent acute  respiratory failure needing mechanical ventilation; d) prolonged hospitalization; e) DVT/Pulmonary embolism; f) Acute Pulmonary edema  Recommend 1. Short duration of surgery as much as possible and avoid paralytic if possible 2. Ok to have surgery it outpatient setting. Would transition to BIPAP after extubation. Recovery in step down or ICU with Pulmonary consultation if needed.  3. DVT prophylaxis 4. Aggressive pulmonary toilet with o2, bronchodilatation, and incentive spirometry and early ambulation     EMartyn Ehrich NP 01/19/2023

## 2023-01-19 NOTE — Assessment & Plan Note (Signed)
-  Following with Allergy. Taking Zyrtec '10mg'$  daily. Given Symbicort 61mg to use as needed for reactive airways.

## 2023-01-19 NOTE — Telephone Encounter (Signed)
UA already ordered

## 2023-01-19 NOTE — Addendum Note (Signed)
Addended by: Octavio Manns E on: 01/19/2023 08:12 AM   Modules accepted: Orders

## 2023-01-19 NOTE — Assessment & Plan Note (Addendum)
Patient is considered moderate risk for prolonged mechanical ventilation and/or post op pulmonary complications due to history of severe sleep apnea.  She is moderately compliant with CPAP, auto settings 5 to 15 cm H2O.  Advised patient bring CPAP with her day of surgery.  Encourage better compliance with CPAP use, advised she aim to wear 4 to 6 hours every night. She is optimized for surgery from pulmonary standpoint. Ultimate clearance will be decided upon by surgeon.   Major Pulmonary risks identified in the multifactorial risk analysis are but not limited to a) pneumonia; b) recurrent intubation risk; c) prolonged or recurrent acute respiratory failure needing mechanical ventilation; d) prolonged hospitalization; e) DVT/Pulmonary embolism; f) Acute Pulmonary edema  Recommend 1. Short duration of surgery as much as possible and avoid paralytic if possible 2. Ok to have surgery it outpatient setting. Would transition to BIPAP after extubation. Recovery in step down or ICU with Pulmonary consultation if needed.  3. DVT prophylaxis 4. Aggressive pulmonary toilet with o2, bronchodilatation, and incentive spirometry and early ambulation

## 2023-01-19 NOTE — Telephone Encounter (Signed)
Done, pt notified.

## 2023-01-20 NOTE — Telephone Encounter (Signed)
OV notes and clearance form have been faxed back to [facility]. Nothing further needed at this time.  

## 2023-01-21 ENCOUNTER — Other Ambulatory Visit: Payer: Self-pay

## 2023-01-21 DIAGNOSIS — N390 Urinary tract infection, site not specified: Secondary | ICD-10-CM

## 2023-01-21 MED ORDER — CIPROFLOXACIN HCL 500 MG PO TABS: 500.0000 mg | ORAL_TABLET | Freq: Two times a day (BID) | ORAL | 0 refills | Status: AC

## 2023-01-21 NOTE — Progress Notes (Signed)
Reviewed and agree with assessment/plan.   Chesley Mires, MD [facility] Pulmonary/Critical Care 01/21/2023, 8:44 AM Pager:  [phone]

## 2023-01-25 ENCOUNTER — Other Ambulatory Visit: Payer: Self-pay | Admitting: Family Medicine

## 2023-01-27 DIAGNOSIS — G4733 Obstructive sleep apnea (adult) (pediatric): Secondary | ICD-10-CM | POA: Diagnosis not present

## 2023-02-03 ENCOUNTER — Other Ambulatory Visit (HOSPITAL_COMMUNITY): Payer: Self-pay

## 2023-02-03 ENCOUNTER — Telehealth: Payer: Self-pay

## 2023-02-03 NOTE — Telephone Encounter (Signed)
Pharmacy Patient Advocate Encounter   Received notification from [facility] that prior authorization for Acyclovir 5% ointment is required/requested.  Per Test Claim: Prior auth required - try famciclovir   Key [id number]  Status is not submitted.   Please provide diagnosis code for the acyclovir to be submitted.   Thank you.

## 2023-02-04 DIAGNOSIS — G4733 Obstructive sleep apnea (adult) (pediatric): Secondary | ICD-10-CM | POA: Diagnosis not present

## 2023-02-07 DIAGNOSIS — J3081 Allergic rhinitis due to animal (cat) (dog) hair and dander: Secondary | ICD-10-CM | POA: Diagnosis not present

## 2023-02-07 DIAGNOSIS — J3089 Other allergic rhinitis: Secondary | ICD-10-CM | POA: Diagnosis not present

## 2023-02-07 DIAGNOSIS — M25662 Stiffness of left knee, not elsewhere classified: Secondary | ICD-10-CM | POA: Diagnosis not present

## 2023-02-07 DIAGNOSIS — R262 Difficulty in walking, not elsewhere classified: Secondary | ICD-10-CM | POA: Diagnosis not present

## 2023-02-07 DIAGNOSIS — J301 Allergic rhinitis due to pollen: Secondary | ICD-10-CM | POA: Diagnosis not present

## 2023-02-07 DIAGNOSIS — M1732 Unilateral post-traumatic osteoarthritis, left knee: Secondary | ICD-10-CM | POA: Diagnosis not present

## 2023-02-14 NOTE — Telephone Encounter (Signed)
Cancel the Acyclovir and call in Famcyclovir 500 mg to take TID as needed for fever blisters, #90 with 5 rf

## 2023-02-15 DIAGNOSIS — M1712 Unilateral primary osteoarthritis, left knee: Secondary | ICD-10-CM | POA: Diagnosis not present

## 2023-02-16 DIAGNOSIS — J301 Allergic rhinitis due to pollen: Secondary | ICD-10-CM | POA: Diagnosis not present

## 2023-02-16 DIAGNOSIS — J3089 Other allergic rhinitis: Secondary | ICD-10-CM | POA: Diagnosis not present

## 2023-02-16 DIAGNOSIS — J3081 Allergic rhinitis due to animal (cat) (dog) hair and dander: Secondary | ICD-10-CM | POA: Diagnosis not present

## 2023-02-17 ENCOUNTER — Other Ambulatory Visit: Payer: Self-pay

## 2023-02-17 MED ORDER — FAMCICLOVIR 500 MG PO TABS: 500.0000 mg | ORAL_TABLET | Freq: Three times a day (TID) | ORAL | 5 refills | Status: DC

## 2023-02-17 NOTE — Telephone Encounter (Signed)
Pt Rx was sent per Dr Sarajane Jews

## 2023-02-17 NOTE — Telephone Encounter (Signed)
Pt was notified via MyChart.

## 2023-02-22 ENCOUNTER — Other Ambulatory Visit: Payer: Self-pay | Admitting: Family Medicine

## 2023-02-25 DIAGNOSIS — G4733 Obstructive sleep apnea (adult) (pediatric): Secondary | ICD-10-CM | POA: Diagnosis not present

## 2023-02-27 ENCOUNTER — Other Ambulatory Visit: Payer: Self-pay | Admitting: Family Medicine

## 2023-03-02 DIAGNOSIS — M1712 Unilateral primary osteoarthritis, left knee: Secondary | ICD-10-CM | POA: Diagnosis not present

## 2023-03-02 DIAGNOSIS — G8918 Other acute postprocedural pain: Secondary | ICD-10-CM | POA: Diagnosis not present

## 2023-03-02 DIAGNOSIS — Z96652 Presence of left artificial knee joint: Secondary | ICD-10-CM | POA: Diagnosis not present

## 2023-03-02 HISTORY — PX: TOTAL KNEE ARTHROPLASTY: SHX125

## 2023-03-04 DIAGNOSIS — Z96652 Presence of left artificial knee joint: Secondary | ICD-10-CM | POA: Diagnosis not present

## 2023-03-04 DIAGNOSIS — M25662 Stiffness of left knee, not elsewhere classified: Secondary | ICD-10-CM | POA: Diagnosis not present

## 2023-03-04 DIAGNOSIS — M6281 Muscle weakness (generalized): Secondary | ICD-10-CM | POA: Diagnosis not present

## 2023-03-08 DIAGNOSIS — M6281 Muscle weakness (generalized): Secondary | ICD-10-CM | POA: Diagnosis not present

## 2023-03-08 DIAGNOSIS — Z96652 Presence of left artificial knee joint: Secondary | ICD-10-CM | POA: Diagnosis not present

## 2023-03-08 DIAGNOSIS — M25662 Stiffness of left knee, not elsewhere classified: Secondary | ICD-10-CM | POA: Diagnosis not present

## 2023-03-09 DIAGNOSIS — M6281 Muscle weakness (generalized): Secondary | ICD-10-CM | POA: Diagnosis not present

## 2023-03-09 DIAGNOSIS — M25662 Stiffness of left knee, not elsewhere classified: Secondary | ICD-10-CM | POA: Diagnosis not present

## 2023-03-09 DIAGNOSIS — Z96652 Presence of left artificial knee joint: Secondary | ICD-10-CM | POA: Diagnosis not present

## 2023-03-11 DIAGNOSIS — M6281 Muscle weakness (generalized): Secondary | ICD-10-CM | POA: Diagnosis not present

## 2023-03-11 DIAGNOSIS — M25662 Stiffness of left knee, not elsewhere classified: Secondary | ICD-10-CM | POA: Diagnosis not present

## 2023-03-11 DIAGNOSIS — Z96652 Presence of left artificial knee joint: Secondary | ICD-10-CM | POA: Diagnosis not present

## 2023-03-14 DIAGNOSIS — M6281 Muscle weakness (generalized): Secondary | ICD-10-CM | POA: Diagnosis not present

## 2023-03-14 DIAGNOSIS — Z96652 Presence of left artificial knee joint: Secondary | ICD-10-CM | POA: Diagnosis not present

## 2023-03-14 DIAGNOSIS — M25662 Stiffness of left knee, not elsewhere classified: Secondary | ICD-10-CM | POA: Diagnosis not present

## 2023-03-15 DIAGNOSIS — M6281 Muscle weakness (generalized): Secondary | ICD-10-CM | POA: Diagnosis not present

## 2023-03-15 DIAGNOSIS — M25662 Stiffness of left knee, not elsewhere classified: Secondary | ICD-10-CM | POA: Diagnosis not present

## 2023-03-15 DIAGNOSIS — M25562 Pain in left knee: Secondary | ICD-10-CM | POA: Diagnosis not present

## 2023-03-15 DIAGNOSIS — Z96652 Presence of left artificial knee joint: Secondary | ICD-10-CM | POA: Diagnosis not present

## 2023-03-17 DIAGNOSIS — M6281 Muscle weakness (generalized): Secondary | ICD-10-CM | POA: Diagnosis not present

## 2023-03-17 DIAGNOSIS — Z96652 Presence of left artificial knee joint: Secondary | ICD-10-CM | POA: Diagnosis not present

## 2023-03-17 DIAGNOSIS — M25662 Stiffness of left knee, not elsewhere classified: Secondary | ICD-10-CM | POA: Diagnosis not present

## 2023-03-22 DIAGNOSIS — M25662 Stiffness of left knee, not elsewhere classified: Secondary | ICD-10-CM | POA: Diagnosis not present

## 2023-03-22 DIAGNOSIS — M6281 Muscle weakness (generalized): Secondary | ICD-10-CM | POA: Diagnosis not present

## 2023-03-22 DIAGNOSIS — Z96652 Presence of left artificial knee joint: Secondary | ICD-10-CM | POA: Diagnosis not present

## 2023-03-25 DIAGNOSIS — Z96652 Presence of left artificial knee joint: Secondary | ICD-10-CM | POA: Diagnosis not present

## 2023-03-25 DIAGNOSIS — M25662 Stiffness of left knee, not elsewhere classified: Secondary | ICD-10-CM | POA: Diagnosis not present

## 2023-03-25 DIAGNOSIS — M6281 Muscle weakness (generalized): Secondary | ICD-10-CM | POA: Diagnosis not present

## 2023-03-28 DIAGNOSIS — M6281 Muscle weakness (generalized): Secondary | ICD-10-CM | POA: Diagnosis not present

## 2023-03-28 DIAGNOSIS — Z96652 Presence of left artificial knee joint: Secondary | ICD-10-CM | POA: Diagnosis not present

## 2023-03-28 DIAGNOSIS — M25662 Stiffness of left knee, not elsewhere classified: Secondary | ICD-10-CM | POA: Diagnosis not present

## 2023-03-30 DIAGNOSIS — M6281 Muscle weakness (generalized): Secondary | ICD-10-CM | POA: Diagnosis not present

## 2023-03-30 DIAGNOSIS — Z96652 Presence of left artificial knee joint: Secondary | ICD-10-CM | POA: Diagnosis not present

## 2023-03-30 DIAGNOSIS — M25662 Stiffness of left knee, not elsewhere classified: Secondary | ICD-10-CM | POA: Diagnosis not present

## 2023-04-01 DIAGNOSIS — Z96652 Presence of left artificial knee joint: Secondary | ICD-10-CM | POA: Diagnosis not present

## 2023-04-01 DIAGNOSIS — M6281 Muscle weakness (generalized): Secondary | ICD-10-CM | POA: Diagnosis not present

## 2023-04-01 DIAGNOSIS — M25662 Stiffness of left knee, not elsewhere classified: Secondary | ICD-10-CM | POA: Diagnosis not present

## 2023-04-05 DIAGNOSIS — M6281 Muscle weakness (generalized): Secondary | ICD-10-CM | POA: Diagnosis not present

## 2023-04-05 DIAGNOSIS — Z96652 Presence of left artificial knee joint: Secondary | ICD-10-CM | POA: Diagnosis not present

## 2023-04-05 DIAGNOSIS — M25662 Stiffness of left knee, not elsewhere classified: Secondary | ICD-10-CM | POA: Diagnosis not present

## 2023-04-07 DIAGNOSIS — M6281 Muscle weakness (generalized): Secondary | ICD-10-CM | POA: Diagnosis not present

## 2023-04-07 DIAGNOSIS — M25662 Stiffness of left knee, not elsewhere classified: Secondary | ICD-10-CM | POA: Diagnosis not present

## 2023-04-07 DIAGNOSIS — Z96652 Presence of left artificial knee joint: Secondary | ICD-10-CM | POA: Diagnosis not present

## 2023-04-08 DIAGNOSIS — M25662 Stiffness of left knee, not elsewhere classified: Secondary | ICD-10-CM | POA: Diagnosis not present

## 2023-04-08 DIAGNOSIS — Z96652 Presence of left artificial knee joint: Secondary | ICD-10-CM | POA: Diagnosis not present

## 2023-04-08 DIAGNOSIS — M6281 Muscle weakness (generalized): Secondary | ICD-10-CM | POA: Diagnosis not present

## 2023-04-12 DIAGNOSIS — M6281 Muscle weakness (generalized): Secondary | ICD-10-CM | POA: Diagnosis not present

## 2023-04-12 DIAGNOSIS — Z96652 Presence of left artificial knee joint: Secondary | ICD-10-CM | POA: Diagnosis not present

## 2023-04-12 DIAGNOSIS — M25662 Stiffness of left knee, not elsewhere classified: Secondary | ICD-10-CM | POA: Diagnosis not present

## 2023-04-13 DIAGNOSIS — M6281 Muscle weakness (generalized): Secondary | ICD-10-CM | POA: Diagnosis not present

## 2023-04-13 DIAGNOSIS — Z96652 Presence of left artificial knee joint: Secondary | ICD-10-CM | POA: Diagnosis not present

## 2023-04-13 DIAGNOSIS — M25662 Stiffness of left knee, not elsewhere classified: Secondary | ICD-10-CM | POA: Diagnosis not present

## 2023-04-15 DIAGNOSIS — Z96652 Presence of left artificial knee joint: Secondary | ICD-10-CM | POA: Diagnosis not present

## 2023-04-15 DIAGNOSIS — M6281 Muscle weakness (generalized): Secondary | ICD-10-CM | POA: Diagnosis not present

## 2023-04-15 DIAGNOSIS — M25662 Stiffness of left knee, not elsewhere classified: Secondary | ICD-10-CM | POA: Diagnosis not present

## 2023-04-18 DIAGNOSIS — M6281 Muscle weakness (generalized): Secondary | ICD-10-CM | POA: Diagnosis not present

## 2023-04-18 DIAGNOSIS — M25662 Stiffness of left knee, not elsewhere classified: Secondary | ICD-10-CM | POA: Diagnosis not present

## 2023-04-18 DIAGNOSIS — Z96652 Presence of left artificial knee joint: Secondary | ICD-10-CM | POA: Diagnosis not present

## 2023-04-19 ENCOUNTER — Other Ambulatory Visit: Payer: Self-pay | Admitting: Family Medicine

## 2023-04-20 DIAGNOSIS — Z96652 Presence of left artificial knee joint: Secondary | ICD-10-CM | POA: Diagnosis not present

## 2023-04-20 DIAGNOSIS — M25662 Stiffness of left knee, not elsewhere classified: Secondary | ICD-10-CM | POA: Diagnosis not present

## 2023-04-20 DIAGNOSIS — M6281 Muscle weakness (generalized): Secondary | ICD-10-CM | POA: Diagnosis not present

## 2023-04-22 DIAGNOSIS — Z96652 Presence of left artificial knee joint: Secondary | ICD-10-CM | POA: Diagnosis not present

## 2023-04-22 DIAGNOSIS — M6281 Muscle weakness (generalized): Secondary | ICD-10-CM | POA: Diagnosis not present

## 2023-04-22 DIAGNOSIS — M25662 Stiffness of left knee, not elsewhere classified: Secondary | ICD-10-CM | POA: Diagnosis not present

## 2023-04-25 DIAGNOSIS — Z96652 Presence of left artificial knee joint: Secondary | ICD-10-CM | POA: Diagnosis not present

## 2023-04-25 DIAGNOSIS — M25662 Stiffness of left knee, not elsewhere classified: Secondary | ICD-10-CM | POA: Diagnosis not present

## 2023-04-25 DIAGNOSIS — M6281 Muscle weakness (generalized): Secondary | ICD-10-CM | POA: Diagnosis not present

## 2023-04-27 DIAGNOSIS — M25662 Stiffness of left knee, not elsewhere classified: Secondary | ICD-10-CM | POA: Diagnosis not present

## 2023-04-27 DIAGNOSIS — M6281 Muscle weakness (generalized): Secondary | ICD-10-CM | POA: Diagnosis not present

## 2023-04-27 DIAGNOSIS — Z96652 Presence of left artificial knee joint: Secondary | ICD-10-CM | POA: Diagnosis not present

## 2023-04-29 DIAGNOSIS — M6281 Muscle weakness (generalized): Secondary | ICD-10-CM | POA: Diagnosis not present

## 2023-04-29 DIAGNOSIS — M25662 Stiffness of left knee, not elsewhere classified: Secondary | ICD-10-CM | POA: Diagnosis not present

## 2023-04-29 DIAGNOSIS — Z96652 Presence of left artificial knee joint: Secondary | ICD-10-CM | POA: Diagnosis not present

## 2023-05-02 DIAGNOSIS — M25662 Stiffness of left knee, not elsewhere classified: Secondary | ICD-10-CM | POA: Diagnosis not present

## 2023-05-02 DIAGNOSIS — Z96652 Presence of left artificial knee joint: Secondary | ICD-10-CM | POA: Diagnosis not present

## 2023-05-02 DIAGNOSIS — M6281 Muscle weakness (generalized): Secondary | ICD-10-CM | POA: Diagnosis not present

## 2023-05-04 DIAGNOSIS — M6281 Muscle weakness (generalized): Secondary | ICD-10-CM | POA: Diagnosis not present

## 2023-05-04 DIAGNOSIS — M25662 Stiffness of left knee, not elsewhere classified: Secondary | ICD-10-CM | POA: Diagnosis not present

## 2023-05-04 DIAGNOSIS — Z96652 Presence of left artificial knee joint: Secondary | ICD-10-CM | POA: Diagnosis not present

## 2023-05-06 DIAGNOSIS — M6281 Muscle weakness (generalized): Secondary | ICD-10-CM | POA: Diagnosis not present

## 2023-05-06 DIAGNOSIS — M25662 Stiffness of left knee, not elsewhere classified: Secondary | ICD-10-CM | POA: Diagnosis not present

## 2023-05-06 DIAGNOSIS — Z96652 Presence of left artificial knee joint: Secondary | ICD-10-CM | POA: Diagnosis not present

## 2023-05-16 ENCOUNTER — Other Ambulatory Visit: Payer: Self-pay | Admitting: Family Medicine

## 2023-05-18 DIAGNOSIS — M6281 Muscle weakness (generalized): Secondary | ICD-10-CM | POA: Diagnosis not present

## 2023-05-18 DIAGNOSIS — Z96652 Presence of left artificial knee joint: Secondary | ICD-10-CM | POA: Diagnosis not present

## 2023-05-18 DIAGNOSIS — M25662 Stiffness of left knee, not elsewhere classified: Secondary | ICD-10-CM | POA: Diagnosis not present

## 2023-05-20 ENCOUNTER — Other Ambulatory Visit: Payer: Self-pay | Admitting: Family Medicine

## 2023-05-20 DIAGNOSIS — M25662 Stiffness of left knee, not elsewhere classified: Secondary | ICD-10-CM | POA: Diagnosis not present

## 2023-05-20 DIAGNOSIS — M6281 Muscle weakness (generalized): Secondary | ICD-10-CM | POA: Diagnosis not present

## 2023-05-20 DIAGNOSIS — Z96652 Presence of left artificial knee joint: Secondary | ICD-10-CM | POA: Diagnosis not present

## 2023-05-24 DIAGNOSIS — Z96652 Presence of left artificial knee joint: Secondary | ICD-10-CM | POA: Diagnosis not present

## 2023-05-24 DIAGNOSIS — M25662 Stiffness of left knee, not elsewhere classified: Secondary | ICD-10-CM | POA: Diagnosis not present

## 2023-05-24 DIAGNOSIS — M6281 Muscle weakness (generalized): Secondary | ICD-10-CM | POA: Diagnosis not present

## 2023-05-25 DIAGNOSIS — J301 Allergic rhinitis due to pollen: Secondary | ICD-10-CM | POA: Diagnosis not present

## 2023-05-25 DIAGNOSIS — J3089 Other allergic rhinitis: Secondary | ICD-10-CM | POA: Diagnosis not present

## 2023-05-25 DIAGNOSIS — J3081 Allergic rhinitis due to animal (cat) (dog) hair and dander: Secondary | ICD-10-CM | POA: Diagnosis not present

## 2023-05-26 ENCOUNTER — Other Ambulatory Visit: Payer: Self-pay | Admitting: Family Medicine

## 2023-05-26 DIAGNOSIS — M6281 Muscle weakness (generalized): Secondary | ICD-10-CM | POA: Diagnosis not present

## 2023-05-26 DIAGNOSIS — Z96652 Presence of left artificial knee joint: Secondary | ICD-10-CM | POA: Diagnosis not present

## 2023-05-26 DIAGNOSIS — M25662 Stiffness of left knee, not elsewhere classified: Secondary | ICD-10-CM | POA: Diagnosis not present

## 2023-05-30 ENCOUNTER — Encounter: Payer: Self-pay | Admitting: Family Medicine

## 2023-05-30 ENCOUNTER — Ambulatory Visit: Payer: Medicare PPO | Admitting: Family Medicine

## 2023-05-30 VITALS — BP 124/86 | HR 83 | Temp 98.3°F | Wt 195.0 lb

## 2023-05-30 DIAGNOSIS — F418 Other specified anxiety disorders: Secondary | ICD-10-CM

## 2023-05-30 DIAGNOSIS — I10 Essential (primary) hypertension: Secondary | ICD-10-CM | POA: Diagnosis not present

## 2023-05-30 DIAGNOSIS — M1990 Unspecified osteoarthritis, unspecified site: Secondary | ICD-10-CM | POA: Insufficient documentation

## 2023-05-30 DIAGNOSIS — F900 Attention-deficit hyperactivity disorder, predominantly inattentive type: Secondary | ICD-10-CM | POA: Diagnosis not present

## 2023-05-30 DIAGNOSIS — R32 Unspecified urinary incontinence: Secondary | ICD-10-CM

## 2023-05-30 DIAGNOSIS — M159 Polyosteoarthritis, unspecified: Secondary | ICD-10-CM

## 2023-05-30 MED ORDER — SOLIFENACIN SUCCINATE 10 MG PO TABS: 10.0000 mg | ORAL_TABLET | Freq: Every day | ORAL | 3 refills | Status: DC

## 2023-05-30 MED ORDER — METHYLPHENIDATE HCL ER (OSM) 36 MG PO TBCR: 36.0000 mg | EXTENDED_RELEASE_TABLET | Freq: Every day | ORAL | 0 refills | Status: DC

## 2023-05-30 MED ORDER — CELECOXIB 200 MG PO CAPS: 200.0000 mg | ORAL_CAPSULE | Freq: Two times a day (BID) | ORAL | 3 refills | Status: DC

## 2023-05-30 NOTE — Progress Notes (Signed)
   Subjective:    Patient ID: Brandi Parker, female    DOB: [birth year], 67 y.o.   MRN: [id number]  HPI Here for several issues. First she had a left total knee replacement on 03-02-23, and the orthopedist has her taking Celebrex. She asks [place] to refill this. She is currently doing PT and water therapy. Her BP has been stable. Her depression with anxiety is stable. She had stopped Concerta several months ago, and her attention span has been very limited. She asks to get back on this. Finally the Oxybutynin has not been helping with her OAB, and she asks to try something else.    Review of Systems  Constitutional: Negative.   Respiratory: Negative.    Cardiovascular: Negative.   Genitourinary:  Positive for frequency.  Musculoskeletal:  Positive for arthralgias.  Psychiatric/Behavioral:  Positive for decreased concentration. Negative for agitation, confusion, dysphoric mood and hallucinations. The patient is not nervous/anxious.        Objective:   Physical Exam Constitutional:      Appearance: Normal appearance.  Cardiovascular:     Rate and Rhythm: Normal rate and regular rhythm.     Pulses: Normal pulses.     Heart sounds: Normal heart sounds.  Pulmonary:     Effort: Pulmonary effort is normal.     Breath sounds: Normal breath sounds.  Neurological:     Mental Status: She is alert.  Psychiatric:        Mood and Affect: Mood normal.        Behavior: Behavior normal.        Thought Content: Thought content normal.           Assessment & Plan:  Her HTN is stable. Her depression with anxiety is stable. For the joint pains, we w ill refill Celebrex. For the ADHD, she will start back on Concerta 36 mg every morning. For the OAB, she will stop Oxybutynin and they Vesicare 10 mg daily.  Gershon Crane, MD

## 2023-05-31 ENCOUNTER — Other Ambulatory Visit: Payer: Self-pay | Admitting: Family Medicine

## 2023-05-31 DIAGNOSIS — M25662 Stiffness of left knee, not elsewhere classified: Secondary | ICD-10-CM | POA: Diagnosis not present

## 2023-05-31 DIAGNOSIS — M6281 Muscle weakness (generalized): Secondary | ICD-10-CM | POA: Diagnosis not present

## 2023-05-31 DIAGNOSIS — Z96652 Presence of left artificial knee joint: Secondary | ICD-10-CM | POA: Diagnosis not present

## 2023-06-01 ENCOUNTER — Telehealth: Payer: Self-pay

## 2023-06-01 ENCOUNTER — Other Ambulatory Visit (HOSPITAL_COMMUNITY): Payer: Self-pay

## 2023-06-01 NOTE — Telephone Encounter (Signed)
PA submitted, will update in addtional encounter created

## 2023-06-01 NOTE — Telephone Encounter (Signed)
Approval letter in media

## 2023-06-01 NOTE — Telephone Encounter (Signed)
*  Primary  PA request received for VESIcare 10MG  tablets  PA submitted to [facility] and is pending additional questions/determination  *failed oxybutynin  Key: [id number]

## 2023-06-02 DIAGNOSIS — M25662 Stiffness of left knee, not elsewhere classified: Secondary | ICD-10-CM | POA: Diagnosis not present

## 2023-06-02 DIAGNOSIS — M6281 Muscle weakness (generalized): Secondary | ICD-10-CM | POA: Diagnosis not present

## 2023-06-02 DIAGNOSIS — Z96652 Presence of left artificial knee joint: Secondary | ICD-10-CM | POA: Diagnosis not present

## 2023-06-03 DIAGNOSIS — J3089 Other allergic rhinitis: Secondary | ICD-10-CM | POA: Diagnosis not present

## 2023-06-03 DIAGNOSIS — J301 Allergic rhinitis due to pollen: Secondary | ICD-10-CM | POA: Diagnosis not present

## 2023-06-03 DIAGNOSIS — J3081 Allergic rhinitis due to animal (cat) (dog) hair and dander: Secondary | ICD-10-CM | POA: Diagnosis not present

## 2023-06-07 DIAGNOSIS — M1712 Unilateral primary osteoarthritis, left knee: Secondary | ICD-10-CM | POA: Diagnosis not present

## 2023-06-07 DIAGNOSIS — Z96652 Presence of left artificial knee joint: Secondary | ICD-10-CM | POA: Diagnosis not present

## 2023-06-13 DIAGNOSIS — J301 Allergic rhinitis due to pollen: Secondary | ICD-10-CM | POA: Diagnosis not present

## 2023-06-13 DIAGNOSIS — J3081 Allergic rhinitis due to animal (cat) (dog) hair and dander: Secondary | ICD-10-CM | POA: Diagnosis not present

## 2023-06-13 DIAGNOSIS — J3089 Other allergic rhinitis: Secondary | ICD-10-CM | POA: Diagnosis not present

## 2023-06-15 DIAGNOSIS — J3089 Other allergic rhinitis: Secondary | ICD-10-CM | POA: Diagnosis not present

## 2023-06-15 DIAGNOSIS — J4530 Mild persistent asthma, uncomplicated: Secondary | ICD-10-CM | POA: Diagnosis not present

## 2023-06-15 DIAGNOSIS — J3081 Allergic rhinitis due to animal (cat) (dog) hair and dander: Secondary | ICD-10-CM | POA: Diagnosis not present

## 2023-06-15 DIAGNOSIS — J301 Allergic rhinitis due to pollen: Secondary | ICD-10-CM | POA: Diagnosis not present

## 2023-07-01 ENCOUNTER — Telehealth: Payer: Self-pay | Admitting: Family Medicine

## 2023-07-01 MED ORDER — METHYLPHENIDATE HCL ER (OSM) 36 MG PO TBCR: 36.0000 mg | EXTENDED_RELEASE_TABLET | Freq: Every day | ORAL | 0 refills | Status: DC

## 2023-07-01 NOTE — Telephone Encounter (Signed)
Done

## 2023-07-01 NOTE — Telephone Encounter (Signed)
Prescription Request  07/01/2023  LOV: 05/30/2023  What is the name of the medication or equipment? methylphenidate (CONCERTA) 36 MG PO CR tablet   Have you contacted your pharmacy to request a refill? No   Which pharmacy would you like this sent to?  [id number] [place] [place] - [address] AT [address] [facility] [address] Phone: [phone]  Fax: [phone]      Patient notified that their request is being sent to the clinical staff for review and that they should receive a response within 2 business days.   Please advise at Mobile [phone] (mobile)

## 2023-07-01 NOTE — Telephone Encounter (Signed)
Pt LOV was on 05/04/23 Last refill was done on 05/04/23 Please advise

## 2023-07-04 DIAGNOSIS — G4733 Obstructive sleep apnea (adult) (pediatric): Secondary | ICD-10-CM | POA: Diagnosis not present

## 2023-07-14 ENCOUNTER — Encounter (HOSPITAL_BASED_OUTPATIENT_CLINIC_OR_DEPARTMENT_OTHER): Payer: Self-pay | Admitting: Pulmonary Disease

## 2023-07-14 ENCOUNTER — Ambulatory Visit (HOSPITAL_BASED_OUTPATIENT_CLINIC_OR_DEPARTMENT_OTHER): Payer: Medicare PPO | Admitting: Pulmonary Disease

## 2023-07-14 VITALS — BP 128/66 | HR 104 | Resp 21 | Ht 64.5 in | Wt 199.8 lb

## 2023-07-14 DIAGNOSIS — J4520 Mild intermittent asthma, uncomplicated: Secondary | ICD-10-CM

## 2023-07-14 DIAGNOSIS — G4731 Primary central sleep apnea: Secondary | ICD-10-CM | POA: Diagnosis not present

## 2023-07-14 DIAGNOSIS — J3081 Allergic rhinitis due to animal (cat) (dog) hair and dander: Secondary | ICD-10-CM | POA: Diagnosis not present

## 2023-07-14 DIAGNOSIS — J3089 Other allergic rhinitis: Secondary | ICD-10-CM | POA: Diagnosis not present

## 2023-07-14 DIAGNOSIS — J301 Allergic rhinitis due to pollen: Secondary | ICD-10-CM | POA: Diagnosis not present

## 2023-07-14 NOTE — Progress Notes (Signed)
[facility]  Chief Complaint  Patient presents with   Follow-up    6 month follow up , pt wasn't using c-pap as she should b/c of her not sleeping in bed, having issues with mask     Constitutional:  BP 128/66   Pulse (!) 104   Resp (!) 21   Ht 5' 4.5" (1.638 m)   Wt 199 lb 12.8 oz (90.6 kg)   SpO2 95%   BMI 33.77 kg/m   Past Medical History:  ADHD, Allergies, Anemia, Depression, GERD, Migraine HA, Nephrolithiasis, Osteopenia  Past Surgical History:  She  has a past surgical history that includes Tonsillectomy; Dilation and curettage of uterus; Repair nonunion / malunion metatarsal / tarsal bones; Colonoscopy (09/18/2020); Knee arthroscopy (12/16/2011); Knee arthroscopy (Right, 02/22/2018); Cataract extraction, bilateral (04/2020); and Total knee arthroplasty (Left, 03/02/2023).  Brief Summary:  Brandi Parker is a 67 y.o. female with complex sleep apnea and allergic asthma.      Subjective:   She was rearranging her house, and stored stuff on her bed.  She wasn't using her CPAP then, and had to sleep in a chair or on the couch.  Start back to using CPAP a couple weeks ago.  Mask fit is okay.  Feels rested.  Had knee surgery.  Mobility is better.  Started on symbicort prn.  Was getting intermittent wheeze.  Has used a few times over the past several weeks.  Helps when she uses it.  Not having sinus congestion.  Physical Exam:   Appearance - well kempt   ENMT - no sinus tenderness, no oral exudate, no LAN, Mallampati 3 airway, no stridor  Respiratory - equal breath sounds bilaterally, no wheezing or rales  CV - s1s2 regular rate and rhythm, no murmurs  Ext - no clubbing, no edema  Skin - no rashes  Psych - normal mood and affect   Sleep Tests:  PSG 09/08/15 >> AHI 40.6, SpO2 low 88%, CAI 15.3, CPAP 9 cm H2O >> AHI 2.5 HST 01/22/22 >> AHI 61, SpO2 low 73%  Auto CPAP 07/02/23 to 07/11/23 >> used on 9 of 10 nights with  average 4 hrs 59 min.  Average AHI 2.9 with median CPAP 10 and 95 th percentile CPAP 13 cm H2O  Social History:  She  reports that she has never smoked. She has never used smokeless tobacco. She reports current alcohol use. She reports that she does not use drugs.  Family History:  Her family history includes Cancer in her father; Diabetes in her mother; Hashimoto's thyroiditis in her daughter and another family member; Hyperlipidemia in her mother; Hypertension in her mother; Prostate cancer in her father; Stroke in her mother.     Assessment/Plan:   Complex sleep apnea. - she is compliant with CPAP and reports benefit from therapy - she uses [employer] for her DME - current CPAP ordered February 2023 - continue auto CPAP 5 to 15 cm H2O  Mild, intermittent asthma. - prn symbicort   Time Spent Involved in Patient Care on Day of Examination:  25 minutes  Follow up:   Patient Instructions  Follow up in 1 year  Medication List:   Allergies as of 07/14/2023       Reactions   Coconut Flavor Swelling   coconut   Penicillins Swelling   REACTION: anaphylaxis REACTION: anaphylaxis   Sulfamethoxazole-trimethoprim Swelling   REACTION: rash REACTION: rash   Strawberry Flavor    All berries  Sulfa Antibiotics    Other reaction(s): Unknown        Medication List        Accurate as of July 14, 2023  1:21 PM. If you have any questions, ask your nurse or doctor.          calcium carbonate 1500 (600 Ca) MG Tabs tablet Commonly known as: OSCAL Take by mouth daily.   celecoxib 200 MG capsule Commonly known as: CELEBREX Take 1 capsule (200 mg total) by mouth 2 (two) times daily.   cetirizine 10 MG tablet Commonly known as: ZYRTEC Take 10 mg by mouth daily.   Colace 100 MG capsule Generic drug: docusate sodium Take 100 mg by mouth 2 (two) times daily.   EPINEPHrine 0.3 mg/0.3 mL Soaj injection Commonly known as: EPI-PEN 0.3 mg.   estradiol 0.025 MG/24HR Commonly  known as: VIVELLE-DOT Place 1 patch onto the skin once a week.   famciclovir 500 MG tablet Commonly known as: FAMVIR Take 1 tablet (500 mg total) by mouth 3 (three) times daily.   gabapentin 300 MG capsule Commonly known as: NEURONTIN TAKE 1 CAPSULE BY MOUTH THREE TIMES A DAY   losartan 50 MG tablet Commonly known as: COZAAR TAKE 1 TABLET BY MOUTH EVERY DAY   methylphenidate 36 MG CR tablet Commonly known as: Concerta Take 1 tablet (36 mg total) by mouth daily. What changed: Another medication with the same name was removed. Continue taking this medication, and follow the directions you see here. Changed by: Coralyn Helling   methylphenidate 36 MG CR tablet Commonly known as: Concerta Take 1 tablet (36 mg total) by mouth daily. What changed: Another medication with the same name was removed. Continue taking this medication, and follow the directions you see here. Changed by: Coralyn Helling   omeprazole 20 MG capsule Commonly known as: PRILOSEC Take 1 capsule (20 mg total) by mouth daily.   ondansetron 4 MG tablet Commonly known as: ZOFRAN Take 4 mg by mouth every 6 (six) hours as needed.   progesterone 100 MG capsule Commonly known as: PROMETRIUM Take 100 mg by mouth daily.   solifenacin 10 MG tablet Commonly known as: VESICARE Take 1 tablet (10 mg total) by mouth daily.   Symbicort 80-4.5 MCG/ACT inhaler Generic drug: budesonide-formoterol SMARTSIG:2 Puff(s) By Mouth Daily PRN   venlafaxine XR 150 MG 24 hr capsule Commonly known as: EFFEXOR-XR TAKE 1 CAPSULE BY MOUTH DAILY WITH BREAKFAST.   Vitamin D 50 MCG (2000 UT) tablet Take 2,000 Units by mouth daily.   WOMENS 50+ ADVANCED PO Take by mouth.        Signature:  Coralyn Helling, MD [facility] Pulmonary/Critical Care Pager - [phone] 07/14/2023, 1:21 PM

## 2023-07-14 NOTE — Patient Instructions (Signed)
Follow up in 1 year.

## 2023-07-29 DIAGNOSIS — J301 Allergic rhinitis due to pollen: Secondary | ICD-10-CM | POA: Diagnosis not present

## 2023-07-29 DIAGNOSIS — J3089 Other allergic rhinitis: Secondary | ICD-10-CM | POA: Diagnosis not present

## 2023-07-29 DIAGNOSIS — J3081 Allergic rhinitis due to animal (cat) (dog) hair and dander: Secondary | ICD-10-CM | POA: Diagnosis not present

## 2023-08-01 DIAGNOSIS — J3089 Other allergic rhinitis: Secondary | ICD-10-CM | POA: Diagnosis not present

## 2023-08-01 DIAGNOSIS — J301 Allergic rhinitis due to pollen: Secondary | ICD-10-CM | POA: Diagnosis not present

## 2023-08-01 DIAGNOSIS — J3081 Allergic rhinitis due to animal (cat) (dog) hair and dander: Secondary | ICD-10-CM | POA: Diagnosis not present

## 2023-08-05 DIAGNOSIS — J301 Allergic rhinitis due to pollen: Secondary | ICD-10-CM | POA: Diagnosis not present

## 2023-08-05 DIAGNOSIS — J3081 Allergic rhinitis due to animal (cat) (dog) hair and dander: Secondary | ICD-10-CM | POA: Diagnosis not present

## 2023-08-05 DIAGNOSIS — J3089 Other allergic rhinitis: Secondary | ICD-10-CM | POA: Diagnosis not present

## 2023-08-08 DIAGNOSIS — J3089 Other allergic rhinitis: Secondary | ICD-10-CM | POA: Diagnosis not present

## 2023-08-08 DIAGNOSIS — J3081 Allergic rhinitis due to animal (cat) (dog) hair and dander: Secondary | ICD-10-CM | POA: Diagnosis not present

## 2023-08-08 DIAGNOSIS — J301 Allergic rhinitis due to pollen: Secondary | ICD-10-CM | POA: Diagnosis not present

## 2023-08-11 DIAGNOSIS — J301 Allergic rhinitis due to pollen: Secondary | ICD-10-CM | POA: Diagnosis not present

## 2023-08-11 DIAGNOSIS — J3089 Other allergic rhinitis: Secondary | ICD-10-CM | POA: Diagnosis not present

## 2023-08-11 DIAGNOSIS — J3081 Allergic rhinitis due to animal (cat) (dog) hair and dander: Secondary | ICD-10-CM | POA: Diagnosis not present

## 2023-08-19 ENCOUNTER — Telehealth: Payer: Self-pay | Admitting: Family Medicine

## 2023-08-19 DIAGNOSIS — J3089 Other allergic rhinitis: Secondary | ICD-10-CM | POA: Diagnosis not present

## 2023-08-19 DIAGNOSIS — J301 Allergic rhinitis due to pollen: Secondary | ICD-10-CM | POA: Diagnosis not present

## 2023-08-19 DIAGNOSIS — J3081 Allergic rhinitis due to animal (cat) (dog) hair and dander: Secondary | ICD-10-CM | POA: Diagnosis not present

## 2023-08-19 NOTE — Telephone Encounter (Signed)
Prescription Request  08/19/2023  LOV: 05/30/2023  What is the name of the medication or equipment? Methylphenidate (Concerta)  Have you contacted your pharmacy to request a refill? Yes   Which pharmacy would you like this sent to? [id number] [place], [place] - [address] AT [address] OF [facility] [address] [place] [address] Phone: [phone] Fax: [phone]   Patient notified that their request is being sent to the clinical staff for review and that they should receive a response within 2 business days.   Please advise at Mobile [phone] (mobile)

## 2023-08-21 ENCOUNTER — Other Ambulatory Visit: Payer: Self-pay | Admitting: Family Medicine

## 2023-08-23 DIAGNOSIS — J301 Allergic rhinitis due to pollen: Secondary | ICD-10-CM | POA: Diagnosis not present

## 2023-08-23 DIAGNOSIS — J3081 Allergic rhinitis due to animal (cat) (dog) hair and dander: Secondary | ICD-10-CM | POA: Diagnosis not present

## 2023-08-23 DIAGNOSIS — J3089 Other allergic rhinitis: Secondary | ICD-10-CM | POA: Diagnosis not present

## 2023-08-24 ENCOUNTER — Encounter: Payer: Self-pay | Admitting: Family Medicine

## 2023-08-24 ENCOUNTER — Ambulatory Visit: Payer: Medicare PPO | Admitting: Family Medicine

## 2023-08-24 VITALS — BP 128/84 | HR 97 | Temp 98.9°F | Wt 202.0 lb

## 2023-08-24 DIAGNOSIS — I10 Essential (primary) hypertension: Secondary | ICD-10-CM | POA: Diagnosis not present

## 2023-08-24 DIAGNOSIS — F900 Attention-deficit hyperactivity disorder, predominantly inattentive type: Secondary | ICD-10-CM

## 2023-08-24 DIAGNOSIS — N390 Urinary tract infection, site not specified: Secondary | ICD-10-CM

## 2023-08-24 DIAGNOSIS — F418 Other specified anxiety disorders: Secondary | ICD-10-CM

## 2023-08-24 LAB — POC URINALSYSI DIPSTICK (AUTOMATED)
Bilirubin, UA: NEGATIVE
Blood, UA: NEGATIVE
Glucose, UA: NEGATIVE
Ketones, UA: NEGATIVE
Nitrite, UA: NEGATIVE
Protein, UA: NEGATIVE
Spec Grav, UA: 1.015 (ref 1.010–1.025)
Urobilinogen, UA: 0.2 U/dL
pH, UA: 6 (ref 5.0–8.0)

## 2023-08-24 MED ORDER — METHYLPHENIDATE HCL ER (OSM) 36 MG PO TBCR: 36.0000 mg | EXTENDED_RELEASE_TABLET | Freq: Every day | ORAL | 0 refills | Status: DC

## 2023-08-24 MED ORDER — CIPROFLOXACIN HCL 500 MG PO TABS
500.0000 mg | ORAL_TABLET | Freq: Two times a day (BID) | ORAL | 0 refills | Status: DC
Start: 2023-08-24 — End: 2023-10-14

## 2023-08-24 MED ORDER — LOSARTAN POTASSIUM 50 MG PO TABS: 50.0000 mg | ORAL_TABLET | Freq: Every day | ORAL | 3 refills | Status: DC

## 2023-08-24 NOTE — Addendum Note (Signed)
Addended by: Carola Rhine on: 08/24/2023 05:12 PM   Modules accepted: Orders

## 2023-08-24 NOTE — Progress Notes (Signed)
   Subjective:    Patient ID: Brandi Parker, female    DOB: 1956/09/16, 67 y.o.   MRN: [id number]  HPI Here for several issues. First she thinks she may have a UTI. Fo rthe past 2 weeks she has had low back pain and increased frequency of urination. No burning or fever. Also she asks for refills on Concerta. This has been working well for her. Also we are following up on HTN. She started taking Losartan 50 mg daily 3 months ago, and this has been wokring well for her. Finally she asks about weight loss. She has tried Phentermine with poor results, along with diet and exercise.   Review of Systems  Constitutional: Negative.   Respiratory: Negative.    Cardiovascular: Negative.   Gastrointestinal: Negative.   Genitourinary:  Positive for frequency. Negative for dysuria, flank pain, hematuria and urgency.  Musculoskeletal:  Positive for back pain.       Objective:   Physical Exam Constitutional:      Appearance: She is obese. She is not ill-appearing.  Cardiovascular:     Rate and Rhythm: Normal rate and regular rhythm.     Pulses: Normal pulses.     Heart sounds: Normal heart sounds.  Pulmonary:     Effort: Pulmonary effort is normal.     Breath sounds: Normal breath sounds.  Abdominal:     Tenderness: There is no right CVA tenderness or left CVA tenderness.  Neurological:     General: No focal deficit present.     Mental Status: She is alert and oriented to person, place, and time.  Psychiatric:        Mood and Affect: Mood normal.           Assessment & Plan:  Her HTN is well controlled, so we will refill the Losartan. Her ADHD is stable so we will refill the Concerta. She has a UTI, so we will treat this with 7 days of Cipro. We will also culture the sample. For obesity, she will check with her insurance as whether they will cover any of the SGLP-1 agonists available.  We spent a total of (31   ) minutes reviewing records and discussing these issues.  Gershon Crane,  MD

## 2023-08-25 DIAGNOSIS — N390 Urinary tract infection, site not specified: Secondary | ICD-10-CM | POA: Diagnosis not present

## 2023-08-26 LAB — URINE CULTURE
MICRO NUMBER:: 15426250
SPECIMEN QUALITY:: ADEQUATE

## 2023-08-29 DIAGNOSIS — N951 Menopausal and female climacteric states: Secondary | ICD-10-CM | POA: Diagnosis not present

## 2023-08-29 DIAGNOSIS — Z1231 Encounter for screening mammogram for malignant neoplasm of breast: Secondary | ICD-10-CM | POA: Diagnosis not present

## 2023-08-29 DIAGNOSIS — Z124 Encounter for screening for malignant neoplasm of cervix: Secondary | ICD-10-CM | POA: Diagnosis not present

## 2023-08-29 DIAGNOSIS — Z01419 Encounter for gynecological examination (general) (routine) without abnormal findings: Secondary | ICD-10-CM | POA: Diagnosis not present

## 2023-08-29 DIAGNOSIS — Z01411 Encounter for gynecological examination (general) (routine) with abnormal findings: Secondary | ICD-10-CM | POA: Diagnosis not present

## 2023-08-29 LAB — HM MAMMOGRAPHY

## 2023-08-31 DIAGNOSIS — J3089 Other allergic rhinitis: Secondary | ICD-10-CM | POA: Diagnosis not present

## 2023-08-31 DIAGNOSIS — J301 Allergic rhinitis due to pollen: Secondary | ICD-10-CM | POA: Diagnosis not present

## 2023-08-31 DIAGNOSIS — J3081 Allergic rhinitis due to animal (cat) (dog) hair and dander: Secondary | ICD-10-CM | POA: Diagnosis not present

## 2023-09-01 DIAGNOSIS — M25562 Pain in left knee: Secondary | ICD-10-CM | POA: Diagnosis not present

## 2023-09-01 DIAGNOSIS — M7918 Myalgia, other site: Secondary | ICD-10-CM | POA: Diagnosis not present

## 2023-09-03 DIAGNOSIS — G4733 Obstructive sleep apnea (adult) (pediatric): Secondary | ICD-10-CM | POA: Diagnosis not present

## 2023-09-05 DIAGNOSIS — J3089 Other allergic rhinitis: Secondary | ICD-10-CM | POA: Diagnosis not present

## 2023-09-05 DIAGNOSIS — J301 Allergic rhinitis due to pollen: Secondary | ICD-10-CM | POA: Diagnosis not present

## 2023-09-05 DIAGNOSIS — J3081 Allergic rhinitis due to animal (cat) (dog) hair and dander: Secondary | ICD-10-CM | POA: Diagnosis not present

## 2023-10-07 DIAGNOSIS — H509 Unspecified strabismus: Secondary | ICD-10-CM | POA: Diagnosis not present

## 2023-10-07 DIAGNOSIS — Z961 Presence of intraocular lens: Secondary | ICD-10-CM | POA: Diagnosis not present

## 2023-10-07 DIAGNOSIS — H5053 Vertical heterophoria: Secondary | ICD-10-CM | POA: Diagnosis not present

## 2023-10-14 ENCOUNTER — Ambulatory Visit (INDEPENDENT_AMBULATORY_CARE_PROVIDER_SITE_OTHER): Payer: Medicare PPO

## 2023-10-14 VITALS — Ht 64.5 in | Wt 202.0 lb

## 2023-10-14 DIAGNOSIS — Z13820 Encounter for screening for osteoporosis: Secondary | ICD-10-CM

## 2023-10-14 DIAGNOSIS — Z0000 Encounter for general adult medical examination without abnormal findings: Secondary | ICD-10-CM | POA: Diagnosis not present

## 2023-10-14 NOTE — Progress Notes (Signed)
Subjective:   Brandi Parker is a 67 y.o. female who presents for Medicare Annual (Subsequent) preventive examination.  Visit Complete: Virtual I connected with  Brandi Parker on 10/14/23 by a audio enabled telemedicine application and verified that I am speaking with the correct person using two identifiers.  Patient Location: Home  Provider Location: Home Office  I discussed the limitations of evaluation and management by telemedicine. The patient expressed understanding and agreed to proceed.  Vital Signs: Because this visit was a virtual/telehealth visit, some criteria may be missing or patient reported. Any vitals not documented were not able to be obtained and vitals that have been documented are patient reported.  Patient Medicare AWV questionnaire was completed by the patient on 10/10/23; I have confirmed that all information answered by patient is correct and no changes since this date.  Cardiac Risk Factors include: advanced age (>35men, >80 women);hypertension     Objective:    Today's Vitals   10/14/23 1103  Weight: 202 lb (91.6 kg)  Height: 5' 4.5" (1.638 m)   Body mass index is 34.14 kg/m.     10/14/2023   11:13 AM 10/11/2022   12:52 PM 02/20/2021    9:37 AM 02/23/2016    4:14 PM 09/08/2015    8:15 PM  Advanced Directives  Does Patient Have a Medical Advance Directive? No No Yes No No  Type of Advance Directive   Healthcare Power of Attorney    Would patient like information on creating a medical advance directive? No - Patient declined No - Patient declined   No - patient declined information    Current Medications (verified) Outpatient Encounter Medications as of 10/14/2023  Medication Sig   acyclovir (ZOVIRAX) 800 MG tablet Take 1 tablet by mouth 3 (three) times daily as needed.   Calcium Carbonate 1500 MG TABS Take by mouth daily.   celecoxib (CELEBREX) 200 MG capsule Take 1 capsule (200 mg total) by mouth 2 (two) times daily.   cetirizine (ZYRTEC)  10 MG tablet Take 10 mg by mouth daily.   Cholecalciferol (VITAMIN D) 2000 UNITS tablet Take 2,000 Units by mouth daily.   COLACE 100 MG capsule Take 100 mg by mouth 2 (two) times daily.   EPINEPHrine 0.3 mg/0.3 mL IJ SOAJ injection 0.3 mg.   estradiol (VIVELLE-DOT) 0.025 MG/24HR Place 1 patch onto the skin once a week.   gabapentin (NEURONTIN) 300 MG capsule TAKE 1 CAPSULE BY MOUTH THREE TIMES A DAY   losartan (COZAAR) 50 MG tablet Take 1 tablet (50 mg total) by mouth daily.   methylphenidate (CONCERTA) 36 MG PO CR tablet Take 1 tablet (36 mg total) by mouth daily.   methylphenidate (CONCERTA) 36 MG PO CR tablet Take 1 tablet (36 mg total) by mouth daily.   methylphenidate (CONCERTA) 36 MG PO CR tablet Take 1 tablet (36 mg total) by mouth daily.   Multiple Vitamins-Minerals (WOMENS 50+ ADVANCED PO) Take by mouth.   omeprazole (PRILOSEC) 20 MG capsule Take 1 capsule (20 mg total) by mouth daily.   ondansetron (ZOFRAN) 4 MG tablet Take 4 mg by mouth every 6 (six) hours as needed.   progesterone (PROMETRIUM) 100 MG capsule Take 100 mg by mouth daily.   solifenacin (VESICARE) 10 MG tablet Take 1 tablet (10 mg total) by mouth daily.   SYMBICORT 80-4.5 MCG/ACT inhaler SMARTSIG:2 Puff(s) By Mouth Daily PRN   venlafaxine XR (EFFEXOR-XR) 150 MG 24 hr capsule TAKE 1 CAPSULE BY MOUTH DAILY WITH BREAKFAST.   [  DISCONTINUED] ciprofloxacin (CIPRO) 500 MG tablet Take 1 tablet (500 mg total) by mouth 2 (two) times daily.   No facility-administered encounter medications on file as of 10/14/2023.    Allergies (verified) Coconut flavor, Penicillins, Sulfamethoxazole-trimethoprim, Strawberry flavor, and Sulfa antibiotics   History: Past Medical History:  Diagnosis Date   ADHD (attention deficit hyperactivity disorder)    Allergy    Anemia    Arthritis    Brain injury (HCC) 1981   fell off a desk chair   Chicken pox    Depression    Fracture, ankle    Fracture, toe    GERD (gastroesophageal reflux  disease)    Menopausal syndrome    Migraines    Nephrolithiasis    Osteopenia 01/24/09   dexa scan   Sleep apnea    uses cpap 08/2020   Sleep apnea syndrome    could not tolerate CPAP   Urinary incontinence    Past Surgical History:  Procedure Laterality Date   CATARACT EXTRACTION, BILATERAL  04/2020   COLONOSCOPY  09/18/2020   per Dr. Marina Goodell, adenmatous polyp, repeat in 7 yrs   DILATION AND CURETTAGE OF UTERUS     KNEE ARTHROSCOPY  12/16/2011   left knee, per Dr. Cleophas Dunker    KNEE ARTHROSCOPY Right 02/22/2018   per Dr. Jodi Geralds , 2 Left arthroscopy-2015&2016   REPAIR NONUNION / MALUNION METATARSAL / TARSAL BONES     right 5th   TONSILLECTOMY     TOTAL KNEE ARTHROPLASTY Left 03/02/2023   per Dr. Jodi Geralds   Family History  Problem Relation Age of Onset   Diabetes Mother        father   Hyperlipidemia Mother    Hypertension Mother    Stroke Mother    Prostate cancer Father    Cancer Father        tumors in kidneys/tumors in liver   Hashimoto's thyroiditis Daughter    Hashimoto's thyroiditis Other        cousins x 2   Colon cancer Neg Hx    Colon polyps Neg Hx    Esophageal cancer Neg Hx    Rectal cancer Neg Hx    Stomach cancer Neg Hx    Social History   Socioeconomic History   Marital status: Divorced    Spouse name: Not on file   Number of children: Not on file   Years of education: Not on file   Highest education level: Master's degree (e.g., MA, MS, MEng, MEd, MSW, MBA)  Occupational History   Occupation: Runner, broadcasting/film/video  Tobacco Use   Smoking status: Never   Smokeless tobacco: Never  Vaping Use   Vaping status: Never Used  Substance and Sexual Activity   Alcohol use: Yes    Alcohol/week: 0.0 standard drinks of alcohol    Comment: occ   Drug use: No   Sexual activity: Not on file  Other Topics Concern   Not on file  Social History Narrative   Not on file   Social Determinants of Health   Financial Resource Strain: Low Risk  (10/10/2023)    Overall Financial Resource Strain (CARDIA)    Difficulty of Paying Living Expenses: Not hard at all  Food Insecurity: No Food Insecurity (10/10/2023)   Hunger Vital Sign    Worried About Running Out of Food in the Last Year: Never true    Ran Out of Food in the Last Year: Never true  Transportation Needs: No Transportation Needs (10/10/2023)   PRAPARE -  Administrator, Civil Service (Medical): No    Lack of Transportation (Non-Medical): No  Physical Activity: Sufficiently Active (10/10/2023)   Exercise Vital Sign    Days of Exercise per Week: 3 days    Minutes of Exercise per Session: 90 min  Stress: No Stress Concern Present (10/10/2023)   [employer] of Occupational Health - Occupational Stress Questionnaire    Feeling of Stress : Only a little  Social Connections: Moderately Integrated (10/10/2023)   Social Connection and Isolation Panel [NHANES]    Frequency of Communication with Friends and Family: Three times a week    Frequency of Social Gatherings with Friends and Family: Once a week    Attends Religious Services: More than 4 times per year    Active Member of [employer] or Organizations: Yes    Attends Engineer, structural: More than 4 times per year    Marital Status: Divorced    Tobacco Counseling Counseling given: Not Answered   Clinical Intake:  Pre-visit preparation completed: Yes  Pain : No/denies pain     BMI - recorded: 34.14 Nutritional Status: BMI > 30  Obese Nutritional Risks: None Diabetes: No  How often do you need to have someone help you when you read instructions, pamphlets, or other written materials from your doctor or pharmacy?: 1 - Never  Interpreter Needed?: No  Information entered by :: Theresa Mulligan LPN   Activities of Daily Living    10/10/2023   11:37 AM  In your present state of health, do you have any difficulty performing the following activities:  Hearing? 0  Vision? 0  Difficulty concentrating or  making decisions? 0  Walking or climbing stairs? 0  Dressing or bathing? 0  Doing errands, shopping? 0  Preparing Food and eating ? N  Using the Toilet? N  In the past six months, have you accidently leaked urine? Y  Comment Wears pads. Followed by PCP  Do you have problems with loss of bowel control? N  Managing your Medications? N  Managing your Finances? N  Housekeeping or managing your Housekeeping? N    Patient Care Team: Nelwyn Salisbury, MD as PCP - General  Indicate any recent Medical Services you may have received from other than [facility] providers in the past year (date may be approximate).     Assessment:   This is a routine wellness examination for [place].  Hearing/Vision screen Hearing Screening - Comments:: Denies hearing difficulties   Vision Screening - Comments:: Wears rx glasses - up to date with routine eye exams with  [facility]   Goals Addressed               This Visit's Progress     Lose weight (pt-stated)         Depression Screen    10/14/2023   11:07 AM 08/24/2023   11:38 AM 01/11/2023   11:11 AM 01/11/2023   11:10 AM 10/11/2022   12:48 PM 06/04/2022    3:22 PM 05/12/2022   11:10 AM  PHQ 2/9 Scores  PHQ - 2 Score 0 0 0 0 0 2 4  PHQ- 9 Score 0 0 0   15 13    Fall Risk    10/14/2023   11:12 AM 10/10/2023   11:37 AM 08/24/2023   11:38 AM 01/07/2023   11:18 AM 10/11/2022   12:51 PM  Fall Risk   Falls in the past year? 1 1 1 1 1   Number  falls in past yr: 0 1 1 1  0  Injury with Fall? 0 0 0 0 0  Risk for fall due to : No Fall Risks  No Fall Risks  No Fall Risks  Follow up Falls prevention discussed  Falls evaluation completed  Falls prevention discussed    MEDICARE RISK AT HOME: Medicare Risk at Home Any stairs in or around the home?: Yes If so, are there any without handrails?: No Home free of loose throw rugs in walkways, pet beds, electrical cords, etc?: Yes Adequate lighting in your home to reduce risk of falls?: Yes Life  alert?: No Use of a cane, walker or w/c?: No Grab bars in the bathroom?: No Shower chair or bench in shower?: Yes Elevated toilet seat or a handicapped toilet?: No  TIMED UP AND GO:  Was the test performed?  No    Cognitive Function:        10/14/2023   11:14 AM 10/11/2022   12:52 PM  6CIT Screen  What Year? 0 points 0 points  What month? 0 points 0 points  What time? 0 points 0 points  Count back from 20 0 points 0 points  Months in reverse 0 points 0 points  Repeat phrase 4 points 0 points  Total Score 4 points 0 points    Immunizations Immunization History  Administered Date(s) Administered   Influenza Split 10/13/2012, 10/17/2013, 10/09/2017   Influenza Whole 10/14/2010   Influenza,inj,Quad PF,6+ Mos 09/19/2014, 10/31/2015, 11/08/2016, 09/15/2021   PFIZER(Purple Top)SARS-COV-2 Vaccination 02/16/2020, 03/08/2020   PPD Test 10/06/2012   Pfizer Covid-19 Vaccine Bivalent Booster 18yrs & up 10/24/2021   Td 10/14/2010    TDAP status: Due, Education has been provided regarding the importance of this vaccine. Advised may receive this vaccine at local pharmacy or Health Dept. Aware to provide a copy of the vaccination record if obtained from local pharmacy or Health Dept. Verbalized acceptance and understanding.  Flu Vaccine status: Due, Education has been provided regarding the importance of this vaccine. Advised may receive this vaccine at local pharmacy or Health Dept. Aware to provide a copy of the vaccination record if obtained from local pharmacy or Health Dept. Verbalized acceptance and understanding.  Pneumococcal vaccine status: Due, Education has been provided regarding the importance of this vaccine. Advised may receive this vaccine at local pharmacy or Health Dept. Aware to provide a copy of the vaccination record if obtained from local pharmacy or Health Dept. Verbalized acceptance and understanding.  Covid-19 vaccine status: Declined, Education has been provided  regarding the importance of this vaccine but patient still declined. Advised may receive this vaccine at local pharmacy or Health Dept.or vaccine clinic. Aware to provide a copy of the vaccination record if obtained from local pharmacy or Health Dept. Verbalized acceptance and understanding.  Qualifies for Shingles Vaccine? Yes   Zostavax completed No   Shingrix Completed?: No.    Education has been provided regarding the importance of this vaccine. Patient has been advised to call insurance company to determine out of pocket expense if they have not yet received this vaccine. Advised may also receive vaccine at local pharmacy or Health Dept. Verbalized acceptance and understanding.  Screening Tests Health Maintenance  Topic Date Due   Hepatitis C Screening  Never done   Zoster Vaccines- Shingrix (1 of 2) Never done   DTaP/Tdap/Td (2 - Tdap) 10/14/2020   Pneumonia Vaccine 30+ Years old (1 of 1 - PCV) Never done   DEXA SCAN  Never done  INFLUENZA VACCINE  07/21/2023   COVID-19 Vaccine (4 - 2023-24 season) 08/21/2023   Medicare Annual Wellness (AWV)  10/13/2024   MAMMOGRAM  08/28/2025   Colonoscopy  09/19/2027   HPV VACCINES  Aged Out    Health Maintenance  Health Maintenance Due  Topic Date Due   Hepatitis C Screening  Never done   Zoster Vaccines- Shingrix (1 of 2) Never done   DTaP/Tdap/Td (2 - Tdap) 10/14/2020   Pneumonia Vaccine 59+ Years old (1 of 1 - PCV) Never done   DEXA SCAN  Never done   INFLUENZA VACCINE  07/21/2023   COVID-19 Vaccine (4 - 2023-24 season) 08/21/2023    Colorectal cancer screening: Type of screening: Colonoscopy. Completed 09/18/20. Repeat every 7 years  Mammogram status: Completed 08/29/23. Repeat every year  Bone Density status: Ordered 10/14/23. Pt provided with contact info and advised to call to schedule appt.     Additional Screening:  Hepatitis C Screening: does qualify;  Deferred  Vision Screening: Recommended annual ophthalmology exams  for early detection of glaucoma and other disorders of the eye. Is the patient up to date with their annual eye exam?  Yes  Who is the provider or what is the name of the office in which the patient attends annual eye exams? [facility] If pt is not established with a provider, would they like to be referred to a provider to establish care? No .   Dental Screening: Recommended annual dental exams for proper oral hygiene    Community Resource Referral / Chronic Care Management:  CRR required this visit?  No   CCM required this visit?  No     Plan:     I have personally reviewed and noted the following in the patient's chart:   Medical and social history Use of alcohol, tobacco or illicit drugs  Current medications and supplements including opioid prescriptions. Patient is not currently taking opioid prescriptions. Functional ability and status Nutritional status Physical activity Advanced directives List of other physicians Hospitalizations, surgeries, and ER visits in previous 12 months Vitals Screenings to include cognitive, depression, and falls Referrals and appointments  In addition, I have reviewed and discussed with patient certain preventive protocols, quality metrics, and best practice recommendations. A written personalized care plan for preventive services as well as general preventive health recommendations were provided to patient.     Tillie Rung, LPN   08/65/7846   After Visit Summary: (MyChart) Due to this being a telephonic visit, the after visit summary with patients personalized plan was offered to patient via MyChart   Nurse Notes: None

## 2023-10-14 NOTE — Patient Instructions (Addendum)
Brandi Parker , Thank you for taking time to come for your Medicare Wellness Visit. I appreciate your ongoing commitment to your health goals. Please review the following plan we discussed and let me know if I can assist you in the future.   Referrals/Orders/Follow-Ups/Clinician Recommendations:   This is a list of the screening recommended for you and due dates:  Health Maintenance  Topic Date Due   Hepatitis C Screening  Never done   Zoster (Shingles) Vaccine (1 of 2) Never done   DTaP/Tdap/Td vaccine (2 - Tdap) 10/14/2020   Pneumonia Vaccine (1 of 1 - PCV) Never done   DEXA scan (bone density measurement)  Never done   Flu Shot  07/21/2023   COVID-19 Vaccine (4 - 2023-24 season) 08/21/2023   Medicare Annual Wellness Visit  10/13/2024   Mammogram  08/28/2025   Colon Cancer Screening  09/19/2027   HPV Vaccine  Aged Out    Advanced directives: (Declined) Advance directive discussed with you today. Even though you declined this today, please call our office should you change your mind, and we can give you the proper paperwork for you to fill out.  Next Medicare Annual Wellness Visit scheduled for next year: Yes

## 2023-11-11 DIAGNOSIS — H5340 Unspecified visual field defects: Secondary | ICD-10-CM | POA: Diagnosis not present

## 2023-11-30 DIAGNOSIS — J301 Allergic rhinitis due to pollen: Secondary | ICD-10-CM | POA: Diagnosis not present

## 2023-11-30 DIAGNOSIS — J3081 Allergic rhinitis due to animal (cat) (dog) hair and dander: Secondary | ICD-10-CM | POA: Diagnosis not present

## 2023-11-30 DIAGNOSIS — J3089 Other allergic rhinitis: Secondary | ICD-10-CM | POA: Diagnosis not present

## 2023-12-03 DIAGNOSIS — G4733 Obstructive sleep apnea (adult) (pediatric): Secondary | ICD-10-CM | POA: Diagnosis not present

## 2023-12-06 ENCOUNTER — Telehealth: Payer: Self-pay | Admitting: Family Medicine

## 2023-12-06 NOTE — Telephone Encounter (Signed)
Prescription Request  12/06/2023  LOV: 08/24/2023  What is the name of the medication or equipment?     methylphenidate (CONCERTA) 36 MG PO CR tablet   Have you contacted your pharmacy to request a refill? No   Which pharmacy would you like this sent to?  [id number] - , [place] - [address] AT [address] [facility] [address] Phone: [phone]  Fax: [phone]     Patient notified that their request is being sent to the clinical staff for review and that they should receive a response within 2 business days.   Please advise at Mobile [phone] (mobile)

## 2023-12-08 MED ORDER — METHYLPHENIDATE HCL ER (OSM) 36 MG PO TBCR: 36.0000 mg | EXTENDED_RELEASE_TABLET | Freq: Every day | ORAL | 0 refills | Status: DC

## 2023-12-08 NOTE — Telephone Encounter (Signed)
Pt LOV was on 08/24/23 Pt last refill was done on 08/24/23 Please advise

## 2023-12-08 NOTE — Addendum Note (Signed)
Addended by: Gershon Crane A on: 12/08/2023 12:43 PM   Modules accepted: Orders

## 2023-12-08 NOTE — Telephone Encounter (Signed)
Done

## 2024-01-16 ENCOUNTER — Ambulatory Visit (INDEPENDENT_AMBULATORY_CARE_PROVIDER_SITE_OTHER): Payer: PPO | Admitting: Family Medicine

## 2024-01-16 ENCOUNTER — Encounter: Payer: Self-pay | Admitting: Family Medicine

## 2024-01-16 VITALS — BP 130/84 | HR 89 | Temp 98.0°F | Wt 201.0 lb

## 2024-01-16 DIAGNOSIS — G4739 Other sleep apnea: Secondary | ICD-10-CM

## 2024-01-16 DIAGNOSIS — F418 Other specified anxiety disorders: Secondary | ICD-10-CM

## 2024-01-16 DIAGNOSIS — R32 Unspecified urinary incontinence: Secondary | ICD-10-CM

## 2024-01-16 LAB — POC URINALSYSI DIPSTICK (AUTOMATED)
Bilirubin, UA: NEGATIVE
Blood, UA: NEGATIVE
Glucose, UA: NEGATIVE
Ketones, UA: NEGATIVE
Leukocytes, UA: NEGATIVE
Nitrite, UA: NEGATIVE
Protein, UA: POSITIVE — AB
Spec Grav, UA: 1.02 (ref 1.010–1.025)
Urobilinogen, UA: 2 U/dL — AB
pH, UA: 6 (ref 5.0–8.0)

## 2024-01-16 MED ORDER — BUPROPION HCL ER (XL) 150 MG PO TB24: 150.0000 mg | ORAL_TABLET | Freq: Every day | ORAL | 3 refills | Status: DC

## 2024-01-16 NOTE — Addendum Note (Signed)
Addended by: Carola Rhine on: 01/16/2024 05:18 PM   Modules accepted: Orders

## 2024-01-16 NOTE — Progress Notes (Signed)
   Subjective:    Patient ID: Brandi Parker, female    DOB: 26-Apr-1956, 68 y.o.   MRN: [id number]  HPI Here for several issues. First she has been under a lot of stress lately dealing with family medical issues. This has made her more depressed. She feels sad and unmotivated most days. She still takes Venlafaxine daily. Second, she feels groggy and sleepy all the time, particularly in the mornings. She uses her CPAP. She had been taking Gabapentin 300 mg TID for her back pain, but she has decreased this to once a day at night. Third her urinary incontinence is worse. Vesicare does not help the way it used to.    Review of Systems  Constitutional:  Positive for fatigue.  Respiratory: Negative.    Cardiovascular: Negative.   Genitourinary:  Positive for frequency and urgency. Negative for dysuria and flank pain.  Skin:  Positive for wound.  Neurological: Negative.   Psychiatric/Behavioral:  Positive for decreased concentration and dysphoric mood. Negative for agitation, behavioral problems, confusion, hallucinations and sleep disturbance. The patient is nervous/anxious.        Objective:   Physical Exam Constitutional:      Appearance: Normal appearance.  Cardiovascular:     Rate and Rhythm: Normal rate and regular rhythm.     Pulses: Normal pulses.     Heart sounds: Normal heart sounds.  Pulmonary:     Effort: Pulmonary effort is normal.     Breath sounds: Normal breath sounds.  Neurological:     General: No focal deficit present.     Mental Status: She is alert and oriented to person, place, and time.  Psychiatric:        Mood and Affect: Mood normal.        Behavior: Behavior normal.        Thought Content: Thought content normal.           Assessment & Plan:  First, her depression and anxiety have manifested more as depression lately. We will add Wellbutrin XL 150 mg daily to the Venlafaxine. Second, her grogginess is likely due to medication side effects, so she will  stop the Gabapentin completely. Third, for the urinary urgency and incontinence, refer to Urology.  Gershon Crane, MD

## 2024-01-18 ENCOUNTER — Telehealth: Payer: Self-pay | Admitting: Family Medicine

## 2024-01-18 NOTE — Telephone Encounter (Signed)
Patient is not able to get into my chart to view her lab results, please contact pt to go over results.  Patient also has a question about Ozempic, and Zepbound for prior approval.  Please contact pt at [phone]

## 2024-01-19 NOTE — Telephone Encounter (Signed)
Spoke with pt advised of the urine results pt verbalized understanding. Pt state that she need one of the GLP1 Rx so we can start PA process. Please advise

## 2024-01-20 ENCOUNTER — Other Ambulatory Visit: Payer: Self-pay | Admitting: Family Medicine

## 2024-01-20 MED ORDER — TIRZEPATIDE-WEIGHT MANAGEMENT 2.5 MG/0.5ML ~~LOC~~ SOLN: 2.5000 mg | SUBCUTANEOUS | 2 refills | Status: DC

## 2024-01-20 NOTE — Telephone Encounter (Signed)
I sent in Zepbound so let's see what happens

## 2024-01-23 NOTE — Telephone Encounter (Signed)
Left detailed message for pt to call the office back regarding this medication

## 2024-01-25 NOTE — Telephone Encounter (Signed)
 Please call pt back.

## 2024-01-26 ENCOUNTER — Telehealth: Payer: Self-pay

## 2024-01-26 ENCOUNTER — Other Ambulatory Visit (HOSPITAL_COMMUNITY): Payer: Self-pay

## 2024-01-26 NOTE — Telephone Encounter (Signed)
 Noted.

## 2024-01-26 NOTE — Telephone Encounter (Signed)
 PA needed for Zepbound

## 2024-01-26 NOTE — Telephone Encounter (Signed)
 Pharmacy Patient Advocate Encounter   Received notification from Pt Calls Messages that prior authorization for Zepbound  2.5MG /0.5ML pen-injectors is required/requested.   Insurance verification completed.   The patient is insured through [facility] ADVANTAGE/RX ADVANCE .   Per test claim: PA required; PA submitted to above mentioned insurance via CoverMyMeds Key/confirmation #/EOC [id number] Status is pending

## 2024-01-26 NOTE — Telephone Encounter (Signed)
 PA request has been Submitted. New Encounter created for follow up. For additional info see Pharmacy Prior Auth telephone encounter from 01/26/24.

## 2024-01-27 NOTE — Telephone Encounter (Signed)
 Pharmacy Patient Advocate Encounter  Received notification from [employer] that Prior Authorization for Zepbound  2.5MG /0.5ML pen-injectors has been DENIED.  Full denial letter will be uploaded to the media tab. See denial reason below.   PA #/Case ID/Reference #: [id number]

## 2024-02-09 ENCOUNTER — Other Ambulatory Visit: Payer: Self-pay | Admitting: Family Medicine

## 2024-02-10 NOTE — Telephone Encounter (Signed)
 Pt.notified

## 2024-02-14 ENCOUNTER — Telehealth: Payer: Self-pay

## 2024-02-14 NOTE — Telephone Encounter (Signed)
 Copied from CRM [id number]. Topic: General - Other >> Feb 14, 2024 10:18 AM Sonny Dandy B wrote: Reason for CRM: Les Pou from [employer] called regarding clinical information for the pt. States she sent over a request, has not heard back from anyone. She is requesting a call back at [phone]. She states the information is urgent. She would like a nurse to call her back.,requesting  information on a sleep study, trial and failure of any other medication for pt's condition .

## 2024-02-14 NOTE — Telephone Encounter (Signed)
 Left a detailed message for Huntley Dec with [employer] to call the office back regarding this encounter

## 2024-02-15 ENCOUNTER — Telehealth: Payer: Self-pay

## 2024-02-15 ENCOUNTER — Other Ambulatory Visit: Payer: Self-pay | Admitting: Family Medicine

## 2024-02-15 NOTE — Telephone Encounter (Signed)
 Copied from CRM [id number]. Topic: General - Other >> Feb 15, 2024  9:38 AM Almira Coaster wrote: Reason for CRM: Huntley Dec from [employer] is Is returning a call she received from [place]. Per Huntley Dec all she needs is the sleep study results and trial and failure of any other medication. Best call back number is Phone number [phone] and  Fax number is [phone]

## 2024-02-16 NOTE — Telephone Encounter (Signed)
 2nd attempt left another message to call the office regarding this message

## 2024-02-16 NOTE — Telephone Encounter (Signed)
 Huntley Dec notified of the fax

## 2024-02-16 NOTE — Telephone Encounter (Signed)
 Pt sleep study results faxed to [facility] with [employer]. Confirmation received

## 2024-02-22 DIAGNOSIS — G4733 Obstructive sleep apnea (adult) (pediatric): Secondary | ICD-10-CM | POA: Diagnosis not present

## 2024-02-24 DIAGNOSIS — R350 Frequency of micturition: Secondary | ICD-10-CM | POA: Diagnosis not present

## 2024-02-24 DIAGNOSIS — N3946 Mixed incontinence: Secondary | ICD-10-CM | POA: Diagnosis not present

## 2024-02-24 DIAGNOSIS — R3915 Urgency of urination: Secondary | ICD-10-CM | POA: Diagnosis not present

## 2024-02-29 ENCOUNTER — Encounter: Payer: Self-pay | Admitting: Family Medicine

## 2024-03-05 MED ORDER — TIRZEPATIDE 2.5 MG/0.5ML ~~LOC~~ SOAJ: 2.5000 mg | SUBCUTANEOUS | 2 refills | Status: DC

## 2024-03-05 NOTE — Telephone Encounter (Signed)
 I sent in the [place] but I'm not very hopeful

## 2024-03-12 ENCOUNTER — Other Ambulatory Visit: Payer: Self-pay | Admitting: Family Medicine

## 2024-03-27 ENCOUNTER — Telehealth: Payer: Self-pay

## 2024-03-27 NOTE — Telephone Encounter (Signed)
 Copied from CRM [id number]. Topic: Clinical - Medication Question >> Mar 27, 2024  3:52 PM Brandi Parker wrote: Reason for CRM: Patient wants to know is she can move up to the next dose of the [facility]? If so please send to the  [id number] - , [place] - [address] AT [address] OF [facility] [address]. Please call patient at [phone] with an update.

## 2024-03-28 MED ORDER — TIRZEPATIDE 5 MG/0.5ML ~~LOC~~ SOAJ: 5.0000 mg | SUBCUTANEOUS | 2 refills | Status: DC

## 2024-03-28 NOTE — Addendum Note (Signed)
 Addended by: Gershon Crane A on: 03/28/2024 09:31 AM   Modules accepted: Orders

## 2024-03-28 NOTE — Telephone Encounter (Signed)
**Note De-identified  [place] Obfuscation** Please advise 

## 2024-03-28 NOTE — Telephone Encounter (Signed)
 I sent in a RX for the 5 mg dose

## 2024-03-29 ENCOUNTER — Telehealth: Payer: Self-pay

## 2024-03-29 ENCOUNTER — Other Ambulatory Visit (HOSPITAL_COMMUNITY): Payer: Self-pay

## 2024-03-29 NOTE — Telephone Encounter (Signed)
 Pharmacy Patient Advocate Encounter   Received notification from CoverMyMeds that prior authorization for Mounjaro 5MG /0.5ML auto-injectors is required/requested.   Insurance verification completed.   The patient is insured through [facility] [employer] .    Brandi Parker is only approved for type 2 diabetes. No indication of diabetes on patient's chart. Please advise.   Key: [id number]

## 2024-04-02 DIAGNOSIS — J301 Allergic rhinitis due to pollen: Secondary | ICD-10-CM | POA: Diagnosis not present

## 2024-04-02 DIAGNOSIS — J3081 Allergic rhinitis due to animal (cat) (dog) hair and dander: Secondary | ICD-10-CM | POA: Diagnosis not present

## 2024-04-02 DIAGNOSIS — J3089 Other allergic rhinitis: Secondary | ICD-10-CM | POA: Diagnosis not present

## 2024-04-02 NOTE — Telephone Encounter (Signed)
 Tell Brandi Parker that her insurance will not covert this

## 2024-04-03 NOTE — Telephone Encounter (Signed)
 Spoke with pt advised of Dr Alyne Babinski message, states that she will contact her insurance regarding this and will call the office with an update

## 2024-04-06 ENCOUNTER — Other Ambulatory Visit (HOSPITAL_COMMUNITY): Payer: Self-pay

## 2024-04-06 ENCOUNTER — Other Ambulatory Visit: Payer: Self-pay | Admitting: Family Medicine

## 2024-04-06 NOTE — Telephone Encounter (Signed)
 Copied from CRM [id number]. Topic: Clinical - Medication Refill >> Apr 06, 2024  2:12 PM Albertha Alosa wrote: Most Recent Primary Care Visit:  Provider: Corita Diego A  Department: [facility]  Visit Type: OFFICE VISIT  Date: 01/16/2024  Medication: methylphenidate  (CONCERTA ) 36 MG PO CR tablet  Has the patient contacted their pharmacy? Yes (Agent: If no, request that the patient contact the pharmacy for the refill. If patient does not wish to contact the pharmacy document the reason why and proceed with request.) (Agent: If yes, when and what did the pharmacy advise?)  Is this the correct pharmacy for this prescription? Yes If no, delete pharmacy and type the correct one.  This is the patient's preferred pharmacy:  [id number] - [place], [place] - [address] AT [address] OF [facility] [address] [place] [address] Phone: [phone] Fax: [phone]   Has the prescription been filled recently? No  Is the patient out of the medication? Yes  Has the patient been seen for an appointment in the last year OR does the patient have an upcoming appointment? Yes  Can we respond through MyChart? Yes  Agent: Please be advised that Rx refills may take up to 3 business days. We ask that you follow-up with your pharmacy.

## 2024-04-06 NOTE — Telephone Encounter (Signed)
 Pharmacy Patient Advocate Encounter  Received notification from [employer] that Prior Authorization for MOUNJARO  5MG /0.5ML has been DENIED.  Full denial letter will be uploaded to the media tab. See denial reason below.  PA #/Case ID/Reference #: [id number]

## 2024-04-09 ENCOUNTER — Ambulatory Visit: Payer: Self-pay

## 2024-04-09 DIAGNOSIS — J301 Allergic rhinitis due to pollen: Secondary | ICD-10-CM | POA: Diagnosis not present

## 2024-04-09 DIAGNOSIS — J3081 Allergic rhinitis due to animal (cat) (dog) hair and dander: Secondary | ICD-10-CM | POA: Diagnosis not present

## 2024-04-09 DIAGNOSIS — J3089 Other allergic rhinitis: Secondary | ICD-10-CM | POA: Diagnosis not present

## 2024-04-09 NOTE — Telephone Encounter (Signed)
 patient called stating per pharmacy she needs a Prior Authorization for methylphenidate  (CONCERTA ) 36 MG PO CR tablet.   Copied from CRM [id number]. Topic: Clinical - Prescription Issue >> Apr 09, 2024  5:03 PM Alyse July wrote: Reason for CRM: patient called stating per pharmacy she needs a Prior Authorization for methylphenidate  (CONCERTA ) 36 MG PO CR tablet. Reason for Disposition  [1] Follow-up call from patient regarding patient's clinical status AND [2] information urgent  Answer Assessment - Initial Assessment Questions 1. REASON FOR CALL or QUESTION: "What is your reason for calling today?" or "How can I best help you?" or "What question do you have that I can help answer?"     patient called stating per pharmacy she needs a Prior Authorization for methylphenidate  (CONCERTA ) 36 MG PO CR tablet. 2. CALLER: Document the source of call. (e.g., laboratory, patient).     Patient  Protocols used: PCP Call - No Triage-A-AH

## 2024-04-10 ENCOUNTER — Telehealth: Payer: Self-pay | Admitting: Pharmacy Technician

## 2024-04-10 ENCOUNTER — Other Ambulatory Visit (HOSPITAL_COMMUNITY): Payer: Self-pay

## 2024-04-10 NOTE — Telephone Encounter (Signed)
 Pt LOV was on 01/16/24 Last refill done on 12/08/23 Please advise

## 2024-04-10 NOTE — Telephone Encounter (Signed)
 Pharmacy Patient Advocate Encounter   Received notification from Pt Calls Messages that prior authorization for METHYLPHENIDATE  36MG  CAPSULES is required/requested.   Insurance verification completed.   The patient is insured through [facility] ADVANTAGE/RX ADVANCE .   Per test claim: The current 30 day co-pay is, $37.41.  No PA needed at this time. This test claim was processed through [facility] copay amounts may vary at other pharmacies due to pharmacy/plan contracts, or as the patient moves through the different stages of their insurance plan.

## 2024-04-11 MED ORDER — METHYLPHENIDATE HCL ER (OSM) 36 MG PO TBCR: 36.0000 mg | EXTENDED_RELEASE_TABLET | Freq: Every day | ORAL | 0 refills | Status: DC

## 2024-04-11 NOTE — Telephone Encounter (Signed)
 Done

## 2024-04-12 NOTE — Telephone Encounter (Signed)
 Noted.

## 2024-04-12 NOTE — Telephone Encounter (Signed)
 Pt.notified

## 2024-04-13 ENCOUNTER — Other Ambulatory Visit (HOSPITAL_COMMUNITY): Payer: Self-pay

## 2024-04-13 DIAGNOSIS — J3089 Other allergic rhinitis: Secondary | ICD-10-CM | POA: Diagnosis not present

## 2024-04-13 DIAGNOSIS — J301 Allergic rhinitis due to pollen: Secondary | ICD-10-CM | POA: Diagnosis not present

## 2024-04-13 DIAGNOSIS — J3081 Allergic rhinitis due to animal (cat) (dog) hair and dander: Secondary | ICD-10-CM | POA: Diagnosis not present

## 2024-04-20 DIAGNOSIS — N3946 Mixed incontinence: Secondary | ICD-10-CM | POA: Diagnosis not present

## 2024-04-20 DIAGNOSIS — R350 Frequency of micturition: Secondary | ICD-10-CM | POA: Diagnosis not present

## 2024-04-20 DIAGNOSIS — J3089 Other allergic rhinitis: Secondary | ICD-10-CM | POA: Diagnosis not present

## 2024-04-20 DIAGNOSIS — J301 Allergic rhinitis due to pollen: Secondary | ICD-10-CM | POA: Diagnosis not present

## 2024-04-20 DIAGNOSIS — J3081 Allergic rhinitis due to animal (cat) (dog) hair and dander: Secondary | ICD-10-CM | POA: Diagnosis not present

## 2024-05-07 ENCOUNTER — Other Ambulatory Visit (HOSPITAL_COMMUNITY): Payer: Self-pay

## 2024-05-07 DIAGNOSIS — R350 Frequency of micturition: Secondary | ICD-10-CM | POA: Diagnosis not present

## 2024-05-07 DIAGNOSIS — M6281 Muscle weakness (generalized): Secondary | ICD-10-CM | POA: Diagnosis not present

## 2024-05-07 DIAGNOSIS — N3946 Mixed incontinence: Secondary | ICD-10-CM | POA: Diagnosis not present

## 2024-05-07 MED ORDER — GEMTESA 75 MG PO TABS
75.0000 mg | ORAL_TABLET | Freq: Every day | ORAL | 3 refills | Status: AC
  Filled 2024-05-07: qty 90, 90d supply, fill #0
  Filled 2024-08-01 (×2): qty 90, 90d supply, fill #1
  Filled 2024-12-06: qty 90, 90d supply, fill #2

## 2024-05-11 DIAGNOSIS — H524 Presbyopia: Secondary | ICD-10-CM | POA: Diagnosis not present

## 2024-05-11 DIAGNOSIS — H509 Unspecified strabismus: Secondary | ICD-10-CM | POA: Diagnosis not present

## 2024-05-11 DIAGNOSIS — H5340 Unspecified visual field defects: Secondary | ICD-10-CM | POA: Diagnosis not present

## 2024-05-11 DIAGNOSIS — H5053 Vertical heterophoria: Secondary | ICD-10-CM | POA: Diagnosis not present

## 2024-05-12 ENCOUNTER — Other Ambulatory Visit (HOSPITAL_COMMUNITY): Payer: Self-pay

## 2024-05-17 ENCOUNTER — Other Ambulatory Visit: Payer: Self-pay | Admitting: Family Medicine

## 2024-05-25 ENCOUNTER — Other Ambulatory Visit (HOSPITAL_COMMUNITY): Payer: Self-pay

## 2024-05-25 ENCOUNTER — Inpatient Hospital Stay: Admission: RE | Admit: 2024-05-25 | Payer: Medicare PPO | Source: Ambulatory Visit

## 2024-05-31 ENCOUNTER — Other Ambulatory Visit (HOSPITAL_COMMUNITY): Payer: Self-pay

## 2024-06-04 ENCOUNTER — Telehealth: Payer: Self-pay

## 2024-06-04 NOTE — Telephone Encounter (Signed)
 Entered in error

## 2024-06-07 ENCOUNTER — Ambulatory Visit (HOSPITAL_BASED_OUTPATIENT_CLINIC_OR_DEPARTMENT_OTHER)
Admission: RE | Admit: 2024-06-07 | Discharge: 2024-06-07 | Disposition: A | Discharge status: Home / Self Care | Source: Ambulatory Visit | Attending: Family Medicine | Admitting: Family Medicine

## 2024-06-07 DIAGNOSIS — Z01818 Encounter for other preprocedural examination: Secondary | ICD-10-CM | POA: Diagnosis not present

## 2024-06-07 DIAGNOSIS — M810 Age-related osteoporosis without current pathological fracture: Secondary | ICD-10-CM | POA: Diagnosis not present

## 2024-06-07 DIAGNOSIS — I4510 Unspecified right bundle-branch block: Secondary | ICD-10-CM | POA: Diagnosis not present

## 2024-06-07 DIAGNOSIS — Z13820 Encounter for screening for osteoporosis: Secondary | ICD-10-CM | POA: Insufficient documentation

## 2024-06-07 DIAGNOSIS — Z01810 Encounter for preprocedural cardiovascular examination: Secondary | ICD-10-CM | POA: Diagnosis not present

## 2024-06-07 DIAGNOSIS — Z780 Asymptomatic menopausal state: Secondary | ICD-10-CM | POA: Diagnosis not present

## 2024-06-07 DIAGNOSIS — I10 Essential (primary) hypertension: Secondary | ICD-10-CM | POA: Diagnosis not present

## 2024-06-08 ENCOUNTER — Ambulatory Visit: Payer: Self-pay | Admitting: Family Medicine

## 2024-06-18 DIAGNOSIS — Z96652 Presence of left artificial knee joint: Secondary | ICD-10-CM | POA: Diagnosis not present

## 2024-06-18 DIAGNOSIS — Z471 Aftercare following joint replacement surgery: Secondary | ICD-10-CM | POA: Diagnosis not present

## 2024-06-18 DIAGNOSIS — M25662 Stiffness of left knee, not elsewhere classified: Secondary | ICD-10-CM | POA: Diagnosis not present

## 2024-07-10 DIAGNOSIS — R350 Frequency of micturition: Secondary | ICD-10-CM | POA: Diagnosis not present

## 2024-07-10 DIAGNOSIS — M6281 Muscle weakness (generalized): Secondary | ICD-10-CM | POA: Diagnosis not present

## 2024-07-16 ENCOUNTER — Other Ambulatory Visit: Payer: Self-pay | Admitting: Family Medicine

## 2024-07-19 DIAGNOSIS — R300 Dysuria: Secondary | ICD-10-CM | POA: Diagnosis not present

## 2024-08-01 ENCOUNTER — Other Ambulatory Visit (HOSPITAL_COMMUNITY): Payer: Self-pay

## 2024-08-02 ENCOUNTER — Other Ambulatory Visit (HOSPITAL_COMMUNITY): Payer: Self-pay

## 2024-08-02 DIAGNOSIS — R350 Frequency of micturition: Secondary | ICD-10-CM | POA: Diagnosis not present

## 2024-08-02 DIAGNOSIS — M6281 Muscle weakness (generalized): Secondary | ICD-10-CM | POA: Diagnosis not present

## 2024-08-02 DIAGNOSIS — M62838 Other muscle spasm: Secondary | ICD-10-CM | POA: Diagnosis not present

## 2024-08-02 DIAGNOSIS — N3946 Mixed incontinence: Secondary | ICD-10-CM | POA: Diagnosis not present

## 2024-08-10 DIAGNOSIS — H5053 Vertical heterophoria: Secondary | ICD-10-CM | POA: Diagnosis not present

## 2024-08-10 DIAGNOSIS — H5340 Unspecified visual field defects: Secondary | ICD-10-CM | POA: Diagnosis not present

## 2024-08-15 ENCOUNTER — Other Ambulatory Visit: Payer: Self-pay | Admitting: Family Medicine

## 2024-08-15 NOTE — Telephone Encounter (Signed)
 Copied from CRM #[id number]. Topic: Clinical - Medication Refill >> Aug 15, 2024  2:33 PM Taleah C wrote: Medication: methylphenidate   Has the patient contacted their pharmacy? Yes They couldn't because its a controlled substance  This is the patient's preferred pharmacy:  [facility]  Is this the correct pharmacy for this prescription? Yes If no, delete pharmacy and type the correct one.   Has the prescription been filled recently? Yes  Is the patient out of the medication? No  Has the patient been seen for an appointment in the last year OR does the patient have an upcoming appointment? Yes  Can we respond through MyChart? Yes  Agent: Please be advised that Rx refills may take up to 3 business days. We ask that you follow-up with your pharmacy.

## 2024-08-22 ENCOUNTER — Telehealth: Payer: Self-pay

## 2024-08-22 ENCOUNTER — Other Ambulatory Visit (HOSPITAL_COMMUNITY): Payer: Self-pay

## 2024-08-22 MED ORDER — METHYLPHENIDATE HCL ER (OSM) 36 MG PO TBCR: 36.0000 mg | EXTENDED_RELEASE_TABLET | Freq: Every day | ORAL | 0 refills | Status: DC

## 2024-08-22 NOTE — Telephone Encounter (Signed)
 I sent in the Concerta  refills

## 2024-08-22 NOTE — Telephone Encounter (Signed)
 Copied from CRM #[id number]. Topic: Clinical - Prescription Issue >> Aug 22, 2024 11:42 AM Mesmerise C wrote: Reason for CRM: Patient checking status of methylphenidate  (CONCERTA ) 36 MG PO CR tablet refill request was sent on 8/27 only has 1 more tomorrow needing it sent pharmacy

## 2024-08-22 NOTE — Telephone Encounter (Signed)
 Have her see me in person OV to evaluate this

## 2024-08-22 NOTE — Addendum Note (Signed)
 Addended by: JOHNNY SENIOR A on: 08/22/2024 05:36 PM   Modules accepted: Orders

## 2024-08-22 NOTE — Telephone Encounter (Signed)
 Copied from CRM #[id number]. Topic: General - Other >> Aug 22, 2024 11:44 AM Mesmerise C wrote: Reason for CRM: Patient inquiring if she can get a thumbnail removal done by provider advised doesn't do toenail removal but advised will inquire stated she'll take a recommendation of where to go as well to have it done

## 2024-08-23 NOTE — Telephone Encounter (Signed)
Patient is aware and will make an appt

## 2024-09-07 DIAGNOSIS — M62838 Other muscle spasm: Secondary | ICD-10-CM | POA: Diagnosis not present

## 2024-09-07 DIAGNOSIS — R350 Frequency of micturition: Secondary | ICD-10-CM | POA: Diagnosis not present

## 2024-09-07 DIAGNOSIS — N3946 Mixed incontinence: Secondary | ICD-10-CM | POA: Diagnosis not present

## 2024-09-07 DIAGNOSIS — M6281 Muscle weakness (generalized): Secondary | ICD-10-CM | POA: Diagnosis not present

## 2024-09-27 DIAGNOSIS — R350 Frequency of micturition: Secondary | ICD-10-CM | POA: Diagnosis not present

## 2024-09-27 DIAGNOSIS — N3946 Mixed incontinence: Secondary | ICD-10-CM | POA: Diagnosis not present

## 2024-11-06 ENCOUNTER — Other Ambulatory Visit (HOSPITAL_COMMUNITY): Payer: Self-pay

## 2024-11-06 ENCOUNTER — Telehealth: Payer: Self-pay

## 2024-11-06 NOTE — Telephone Encounter (Signed)
 Pharmacy Patient Advocate Encounter   Received notification from Onbase that prior authorization for Methylphenidate  36 tabs is required/requested.   Insurance verification completed.   The patient is insured through [facility].   Per test claim: PA required; PA submitted to above mentioned insurance via Latent Key/confirmation #/EOC [id number] Status is pending

## 2024-11-06 NOTE — Telephone Encounter (Signed)
 Pharmacy Patient Advocate Encounter  Received notification from [facility] that Prior Authorization for Concerta  36 has been APPROVED from 11/06/24 to 11/07/27. Unable to obtain price due to refill too soon rejection, last fill date 11/06/24 next available fill date12/11/25   PA #/Case ID/Reference #: # [id number]

## 2024-11-08 ENCOUNTER — Ambulatory Visit (INDEPENDENT_AMBULATORY_CARE_PROVIDER_SITE_OTHER): Admitting: Family Medicine

## 2024-11-08 DIAGNOSIS — Z0000 Encounter for general adult medical examination without abnormal findings: Secondary | ICD-10-CM | POA: Diagnosis not present

## 2024-11-08 NOTE — Progress Notes (Signed)
 ----------------------------------------------------------------------------------------------------------------------------------------------------------------------------------------------------------------------  Because this visit was a virtual/telehealth visit, some criteria may be missing or patient reported. Any vitals not documented were not able to be obtained and vitals that have been documented are patient reported.    MEDICARE ANNUAL PREVENTIVE VISIT WITH PROVIDER: (Welcome to Medicare, initial annual wellness or annual wellness exam)  Virtual Visit via Video Note  I connected with Brandi Parker on 11/13/24 by a video enabled telemedicine application and verified that I am speaking with the correct person using two identifiers.  Location patient: home Location provider:work or home office Persons participating in the virtual visit: patient, provider  Concerns and/or follow up today: detailed intake and risks/health assessment completed in flowsheets and below - please see for details. Was denied  How often do you have a drink containing alcohol?1 time per month How many drinks containing alcohol do you have on a typical day when you are drinking? 1 glass How often do you have six or more drinks on one occasion?never Have you ever smoked?n Quit date if applicable? na (except for a few months in college) How many packs a day do/did you smoke? na Do you use smokeless tobacco?n Do you use an illicit drugs?n Do you feel safe at home?y Last dentist visit?every 4 months Last eye Exam and location?goes once a year, [facility]   See HM section in Epic for other details of completed HM.    ROS: negative for report of fevers, unintentional weight loss, vision changes, vision loss, hearing loss or change, chest pain, sob, hemoptysis, melena, hematochezia, hematuria or bleeding or bruising  Patient-completed extensive health risk assessment - reviewed and discussed with  the patient: See Health Risk Assessment completed with patient prior to the visit either above or in recent phone note. This was reviewed in detailed with the patient today and appropriate recommendations, orders and referrals were placed as needed per Summary below and patient instructions.   Review of Medical History: -PMH, PSH, Family History and current specialty and care providers reviewed and updated and listed below   Patient Care Team: Johnny Garnette LABOR, MD as PCP - General   Past Medical History:  Diagnosis Date   ADHD (attention deficit hyperactivity disorder)    Allergy    Anemia    Arthritis    Brain injury (HCC) 1981   fell off a desk chair   Chicken pox    Depression    Fracture, ankle    Fracture, toe    GERD (gastroesophageal reflux disease)    Menopausal syndrome    Migraines    Nephrolithiasis    Osteopenia 01/24/09   dexa scan   Sleep apnea    uses cpap 08/2020   Sleep apnea syndrome    could not tolerate CPAP   Urinary incontinence     Past Surgical History:  Procedure Laterality Date   CATARACT EXTRACTION, BILATERAL  04/2020   COLONOSCOPY  09/18/2020   per Dr. Abran, adenmatous polyp, repeat in 7 yrs   DILATION AND CURETTAGE OF UTERUS     KNEE ARTHROSCOPY  12/16/2011   left knee, per Dr. Anderson    KNEE ARTHROSCOPY Right 02/22/2018   per Dr. Norleen Gavel , 2 Left arthroscopy-2015&2016   REPAIR NONUNION / MALUNION METATARSAL / TARSAL BONES     right 5th   TONSILLECTOMY     TOTAL KNEE ARTHROPLASTY Left 03/02/2023   per Dr. Norleen Gavel    Social History   Socioeconomic History   Marital status: Divorced  Spouse name: Not on file   Number of children: Not on file   Years of education: Not on file   Highest education level: Master's degree (e.g., MA, MS, MEng, MEd, MSW, MBA)  Occupational History   Occupation: teacher  Tobacco Use   Smoking status: Never   Smokeless tobacco: Never  Vaping Use   Vaping status: Never Used  Substance and  Sexual Activity   Alcohol use: Yes    Alcohol/week: 0.0 standard drinks of alcohol    Comment: occ   Drug use: No   Sexual activity: Not on file  Other Topics Concern   Not on file  Social History Narrative   Not on file   Social Drivers of Health   Financial Resource Strain: Low Risk  (11/08/2024)   Overall Financial Resource Strain (CARDIA)    Difficulty of Paying Living Expenses: Not hard at all  Food Insecurity: No Food Insecurity (11/08/2024)   Hunger Vital Sign    Worried About Running Out of Food in the Last Year: Never true    Ran Out of Food in the Last Year: Never true  Transportation Needs: No Transportation Needs (11/08/2024)   PRAPARE - Administrator, Civil Service (Medical): No    Lack of Transportation (Non-Medical): No  Physical Activity: Sufficiently Active (11/08/2024)   Exercise Vital Sign    Days of Exercise per Week: 3 days    Minutes of Exercise per Session: 100 min  Stress: No Stress Concern Present (11/08/2024)   [employer] of Occupational Health - Occupational Stress Questionnaire    Feeling of Stress: Only a little  Social Connections: Moderately Integrated (11/08/2024)   Social Connection and Isolation Panel    Frequency of Communication with Friends and Family: More than three times a week    Frequency of Social Gatherings with Friends and Family: Once a week    Attends Religious Services: More than 4 times per year    Active Member of [employer] or Organizations: Yes    Attends Engineer, Structural: More than 4 times per year    Marital Status: Divorced  Intimate Partner Violence: Not At Risk (10/14/2023)   Humiliation, Afraid, Rape, and Kick questionnaire    Fear of Current or Ex-Partner: No    Emotionally Abused: No    Physically Abused: No    Sexually Abused: No    Family History  Problem Relation Age of Onset   Diabetes Mother        father   Hyperlipidemia Mother    Hypertension Mother    Stroke Mother     Prostate cancer Father    Cancer Father        tumors in kidneys/tumors in liver   Hashimoto's thyroiditis Daughter    Hashimoto's thyroiditis Other        cousins x 2   Colon cancer Neg Hx    Colon polyps Neg Hx    Esophageal cancer Neg Hx    Rectal cancer Neg Hx    Stomach cancer Neg Hx     Current Outpatient Medications on File Prior to Visit  Medication Sig Dispense Refill   acyclovir  (ZOVIRAX ) 800 MG tablet Take 1 tablet by mouth 3 (three) times daily as needed.     buPROPion  (WELLBUTRIN  XL) 150 MG 24 hr tablet TAKE 1 TABLET BY MOUTH EVERY DAY 90 tablet 3   Calcium Carbonate 1500 MG TABS Take by mouth daily.     celecoxib  (CELEBREX ) 200 MG capsule  TAKE 1 CAPSULE BY MOUTH TWICE A DAY 180 capsule 3   cetirizine (ZYRTEC) 10 MG tablet Take 10 mg by mouth daily.     Cholecalciferol (VITAMIN D) 2000 UNITS tablet Take 2,000 Units by mouth daily.     COLACE 100 MG capsule Take 100 mg by mouth 2 (two) times daily.     EPINEPHrine 0.3 mg/0.3 mL IJ SOAJ injection 0.3 mg.     estradiol (VIVELLE-DOT) 0.025 MG/24HR Place 1 patch onto the skin once a week.     losartan  (COZAAR ) 50 MG tablet TAKE 1 TABLET BY MOUTH EVERY DAY 90 tablet 3   methylphenidate  (CONCERTA ) 36 MG PO CR tablet Take 1 tablet (36 mg total) by mouth daily. 30 tablet 0   methylphenidate  (CONCERTA ) 36 MG PO CR tablet Take 1 tablet (36 mg total) by mouth daily. 30 tablet 0   methylphenidate  (CONCERTA ) 36 MG PO CR tablet Take 1 tablet (36 mg total) by mouth daily. 30 tablet 0   Multiple Vitamins-Minerals (WOMENS 50+ ADVANCED PO) Take by mouth.     omeprazole  (PRILOSEC) 20 MG capsule Take 1 capsule (20 mg total) by mouth daily. 30 capsule 3   ondansetron (ZOFRAN) 4 MG tablet Take 4 mg by mouth every 6 (six) hours as needed. (Patient not taking: Reported on 01/16/2024)     progesterone (PROMETRIUM) 100 MG capsule Take 100 mg by mouth daily.     SYMBICORT 80-4.5 MCG/ACT inhaler SMARTSIG:2 Puff(s) By Mouth Daily PRN     venlafaxine   XR (EFFEXOR -XR) 150 MG 24 hr capsule TAKE 1 CAPSULE BY MOUTH DAILY WITH BREAKFAST. 90 capsule 3   Vibegron  (GEMTESA ) 75 MG TABS Take 1 tablet (75 mg total) by mouth daily. 90 tablet 3   No current facility-administered medications on file prior to visit.    Allergies  Allergen Reactions   Coconut Flavoring Agent (Non-Screening) Swelling    coconut   Penicillins Swelling    REACTION: anaphylaxis REACTION: anaphylaxis   Sulfamethoxazole-Trimethoprim Swelling    REACTION: rash REACTION: rash   Strawberry Flavoring Agent (Non-Screening)     All berries   Sulfa Antibiotics     Other reaction(s): Unknown       Physical Exam Vitals requested from patient and listed below if patient had equipment and was able to obtain at home for this virtual visit: There were no vitals filed for this visit. Estimated body mass index is 33.97 kg/m as calculated from the following:   Height as of 10/14/23: 5' 4.5 (1.638 m).   Weight as of 01/16/24: 201 lb (91.2 kg).  EKG (optional): deferred due to virtual visit  GENERAL: alert, oriented, no acute distress detected, full vision exam deferred due to pandemic and/or virtual encounter  HEENT: atraumatic, conjunttiva clear, no obvious abnormalities on inspection of external nose and ears  NECK: normal movements of the head and neck  LUNGS: on inspection no signs of respiratory distress, breathing rate appears normal, no obvious gross SOB, gasping or wheezing  CV: no obvious cyanosis  MS: moves all visible extremities without noticeable abnormality  PSYCH/NEURO: pleasant and cooperative, no obvious depression or anxiety, speech and thought processing grossly intact, Cognitive function grossly intact  Flowsheet Row Clinical Support from 10/14/2023 in [facility]  PHQ-9 Total Score 0        11/08/2024    6:37 PM 10/14/2023   11:07 AM 08/24/2023   11:38 AM 01/11/2023   11:11 AM 01/11/2023   11:10 AM  Depression  screen PHQ 2/9  Decreased Interest 0 0 0 0 0  Down, Depressed, Hopeless 0 0 0 0 0  PHQ - 2 Score 0 0 0 0 0  Altered sleeping  0 0 0   Tired, decreased energy  0 0 0   Change in appetite  0 0 0   Feeling bad or failure about yourself   0 0 0   Trouble concentrating  0 0 0   Moving slowly or fidgety/restless  0 0 0   Suicidal thoughts  0 0 0   PHQ-9 Score  0  0  0    Difficult doing work/chores  Not difficult at all Not difficult at all Not difficult at all      Data saved with a previous flowsheet row definition       10/10/2023   11:37 AM 10/14/2023   11:12 AM 11/08/2024    5:17 PM 11/08/2024    6:08 PM 11/13/2024    9:48 AM  Fall Risk  Falls in the past year? 1 1 1  -- 0  Was there an injury with Fall? 0 0 0 0   Fall Risk Category Calculator 2  1 1      Patient at Risk for Falls Due to  No Fall Risks  Other (Comment) No Fall Risks  Fall risk Follow up  Falls prevention discussed  Falls evaluation completed;Education provided Falls evaluation completed     Patient-reported     SUMMARY AND PLAN:  Encounter for Medicare annual wellness exam   Discussed applicable health maintenance/preventive health measures and advised and referred or ordered per patient preferences: -discussed vaccines due risks and benefit, she plans to get at the pharamacy and agrees to let us  know when she does so that we can update record. Health Maintenance  Topic Date Due   Hepatitis C Screening  Never done   Pneumococcal Vaccine: 50+ Years (1 of 2 - PCV) Never done   Zoster Vaccines- Shingrix (1 of 2) Never done   DTaP/Tdap/Td (2 - Tdap) 10/14/2020   Influenza Vaccine  07/20/2024   COVID-19 Vaccine (5 - 2025-26 season) 08/20/2024   Mammogram  08/28/2025   Medicare Annual Wellness (AWV)  11/08/2025   Colonoscopy  09/19/2027   Bone Density Scan  Completed   Meningococcal B Vaccine  Aged Out      Education and counseling on the following was provided based on the above review of health and  a plan/checklist for the patient, along with additional information discussed, was provided for the patient in the patient instructions :  -Advised on importance of completing advanced directives, discussed options for completing and provided information in patient instructions as well -Advised and counseled on a healthy lifestyle - including the importance of a healthy diet, regular physical activity, social connections and stress management. -Reviewed patient's current diet. Advised and counseled on a whole foods based healthy diet. Spent considerable time on counseling on reducing added sugar and processed grains and alternatives.  A summary of a healthy diet was provided in the Patient Instructions.  -reviewed patient's current physical activity level and discussed exercise guidelines for adults. Congratulated on healthy habits. Further resources provided in patient instructions. -Advise yearly dental visits at minimum and regular eye exams   Follow up: see patient instructions     Patient Instructions  I really enjoyed getting to talk with you today! I am available on Tuesdays and Thursdays for virtual visits if you have any questions or concerns, or if  I can be of any further assistance.   CHECKLIST FROM ANNUAL WELLNESS VISIT:  -Follow up (please call to schedule if not scheduled after visit):   -yearly for annual wellness visit with primary care office  Here is a list of your preventive care/health maintenance measures and the plan for each if any are due:  PLAN For any measures below that may be due:    1. Can get vaccines at the pharmacy (flu shot can be given at the office). Please let us  know if you get any vaccines at the pharmacy so that we can update your records.   2. Can get hepatitis C screening at the office the next time you have labs, please request.   Health Maintenance  Topic Date Due   Hepatitis C Screening  Never done   Pneumococcal Vaccine: 50+ Years (1 of 2  - PCV) Never done   Zoster Vaccines- Shingrix (1 of 2) Never done   DTaP/Tdap/Td (2 - Tdap) 10/14/2020   Influenza Vaccine  07/20/2024   COVID-19 Vaccine (5 - 2025-26 season) 08/20/2024   Mammogram  08/28/2025   Medicare Annual Wellness (AWV)  11/08/2025   Colonoscopy  09/19/2027   Bone Density Scan  Completed   Meningococcal B Vaccine  Aged Out    -See a dentist at least yearly  -Get your eyes checked and then per your eye specialist's recommendations  -Other issues addressed today:   -I have included below further information regarding a healthy whole foods based diet, physical activity guidelines for adults, stress management and opportunities for social connections. I hope you find this information useful.   -----------------------------------------------------------------------------------------------------------------------------------------------------------------------------------------------------------------------------------------------------------    NUTRITION: -eat real food: lots of colorful vegetables (half the plate) and fruits -5-7 servings of vegetables and fruits per day (fresh or steamed is best), exp. 2 servings of vegetables with lunch and dinner and 2 servings of fruit per day. Berries and greens such as kale and collards are great choices.  -consume on a regular basis:  fresh fruits, fresh veggies, fish, nuts, seeds, healthy oils (such as olive oil, avocado oil), whole grains (make sure for bread/pasta/crackers/etc., that the first ingredient on label contains the word whole), legumes. -can eat small amounts of dairy and lean meat (no larger than the palm of your hand), but avoid processed meats such as ham, bacon, lunch meat, etc. -drink water -try to avoid fast food and pre-packaged foods, processed meat, ultra processed foods/beverages (donuts, candy, etc.) -most experts advise limiting sodium to < 2300mg  per day, should limit further is any chronic  conditions such as high blood pressure, heart disease, diabetes, etc. The American Heart Association advised that < 1500mg  is is ideal -try to avoid foods/beverages that contain any ingredients with names you do not recognize  -try to avoid foods/beverages  with added sugar or sweeteners/sweets  -try to avoid sweet drinks (including diet drinks): soda, juice, Gatorade, sweet tea, power drinks, diet drinks -try to avoid white rice, white bread, pasta (unless whole grain)  EXERCISE GUIDELINES FOR ADULTS: -if you wish to increase your physical activity, do so gradually and with the approval of your doctor -STOP and seek medical care immediately if you have any chest pain, chest discomfort or trouble breathing when starting or increasing exercise  -move and stretch your body, legs, feet and arms when sitting for long periods -Physical activity guidelines for optimal health in adults: -get at least 150 minutes per week of moderate exercise (can talk, but not sing); this is about  20-30 minutes of sustained activity 5-7 days per week or two 10-15 minute episodes of sustained activity 5-7 days per week -do some muscle building/resistance training/strength training at least 2 days per week  -balance exercises 3+ days per week:   Stand somewhere where you have something sturdy to hold onto if you lose balance    1) lift up on toes, then back down, start with 5x per day and work up to 20x   2) stand and lift one leg straight out to the side so that foot is a few inches of the floor, start with 5x each side and work up to 20x each side   3) stand on one foot, start with 5 seconds each side and work up to 20 seconds on each side  If you need ideas or help with getting more active:  -Silver sneakers https://tools.silversneakers.com  -Walk with a Doc: Http://www.duncan-williams.com/  -try to include resistance (weight lifting/strength building) and balance exercises twice per week: or the following link for  ideas: http://castillo-powell.com/  buyducts.dk  STRESS MANAGEMENT: -can try meditating, or just sitting quietly with deep breathing while intentionally relaxing all parts of your body for 5 minutes daily -if you need further help with stress, anxiety or depression please follow up with your primary doctor or contact the wonderful folks at [facility]: [phone]  SOCIAL CONNECTIONS: -options in [place] if you wish to engage in more social and exercise related activities:  -Silver sneakers https://tools.silversneakers.com  -Walk with a Doc: Http://www.duncan-williams.com/  -Check out the [facility] Active Adults 50+ section on the [place] of [employer] companies (hiking clubs, book clubs, cards and games, chess, exercise classes, aquatic classes and much more) - see the website for details: https://www.[place].gov/departments/parks-recreation/active-adults50  -YouTube has lots of exercise videos for different ages and abilities as well  -[facility] (a variety of indoor and outdoor inperson activities for adults). [phone]. [address]  -Virtual Online Classes (a variety of topics): see seniorplanet.org or call [phone]  -consider volunteering at a school, hospice center, church, senior center or elsewhere   ADVANCED HEALTHCARE DIRECTIVES:  [place] Advanced Directives assistance:   expressweek.com.cy  Everyone should have advanced health care directives in place. This is so that you get the care you want, should you ever be in a situation where you are unable to make your own medical decisions.   From the [place] Advanced Directive Website: Advance Health Care Directives are legal documents in which you give written instructions about your health care if, in the future, you cannot speak for  yourself.   A health care power of attorney allows you to name a person you trust to make your health care decisions if you cannot make them yourself. A declaration of a desire for a natural death (or living will) is document, which states that you desire not to have your life prolonged by extraordinary measures if you have a terminal or incurable illness or if you are in a vegetative state. An advance instruction for mental health treatment makes a declaration of instructions, information and preferences regarding your mental health treatment. It also states that you are aware that the advance instruction authorizes a mental health treatment provider to act according to your wishes. It may also outline your consent or refusal of mental health treatment. A declaration of an anatomical gift allows anyone over the age of 22 to make a gift by will, organ donor card or other document.   Please see the following website or  an elder law attorney for forms, FAQs and for completion of advanced directives: [place]  Print Production Planner Health Care Directives Advance Health Care Directives (http://guzman.com/)  Or copy and paste the following to your web browser: [email or web address]           Chiquita JONELLE Cramp, DO

## 2024-11-13 NOTE — Patient Instructions (Signed)
 I really enjoyed getting to talk with you today! I am available on Tuesdays and Thursdays for virtual visits if you have any questions or concerns, or if I can be of any further assistance.   CHECKLIST FROM ANNUAL WELLNESS VISIT:  -Follow up (please call to schedule if not scheduled after visit):   -yearly for annual wellness visit with primary care office  Here is a list of your preventive care/health maintenance measures and the plan for each if any are due:  PLAN For any measures below that may be due:    1. Can get vaccines at the pharmacy (flu shot can be given at the office). Please let us  know if you get any vaccines at the pharmacy so that we can update your records.   2. Can get hepatitis C screening at the office the next time you have labs, please request.   Health Maintenance  Topic Date Due   Hepatitis C Screening  Never done   Pneumococcal Vaccine: 50+ Years (1 of 2 - PCV) Never done   Zoster Vaccines- Shingrix (1 of 2) Never done   DTaP/Tdap/Td (2 - Tdap) 10/14/2020   Influenza Vaccine  07/20/2024   COVID-19 Vaccine (5 - 2025-26 season) 08/20/2024   Mammogram  08/28/2025   Medicare Annual Wellness (AWV)  11/08/2025   Colonoscopy  09/19/2027   Bone Density Scan  Completed   Meningococcal B Vaccine  Aged Out    -See a dentist at least yearly  -Get your eyes checked and then per your eye specialist's recommendations  -Other issues addressed today:   -I have included below further information regarding a healthy whole foods based diet, physical activity guidelines for adults, stress management and opportunities for social connections. I hope you find this information useful.   -----------------------------------------------------------------------------------------------------------------------------------------------------------------------------------------------------------------------------------------------------------    NUTRITION: -eat real food: lots of  colorful vegetables (half the plate) and fruits -5-7 servings of vegetables and fruits per day (fresh or steamed is best), exp. 2 servings of vegetables with lunch and dinner and 2 servings of fruit per day. Berries and greens such as kale and collards are great choices.  -consume on a regular basis:  fresh fruits, fresh veggies, fish, nuts, seeds, healthy oils (such as olive oil, avocado oil), whole grains (make sure for bread/pasta/crackers/etc., that the first ingredient on label contains the word whole), legumes. -can eat small amounts of dairy and lean meat (no larger than the palm of your hand), but avoid processed meats such as ham, bacon, lunch meat, etc. -drink water -try to avoid fast food and pre-packaged foods, processed meat, ultra processed foods/beverages (donuts, candy, etc.) -most experts advise limiting sodium to < 2300mg  per day, should limit further is any chronic conditions such as high blood pressure, heart disease, diabetes, etc. The American Heart Association advised that < 1500mg  is is ideal -try to avoid foods/beverages that contain any ingredients with names you do not recognize  -try to avoid foods/beverages  with added sugar or sweeteners/sweets  -try to avoid sweet drinks (including diet drinks): soda, juice, Gatorade, sweet tea, power drinks, diet drinks -try to avoid white rice, white bread, pasta (unless whole grain)  EXERCISE GUIDELINES FOR ADULTS: -if you wish to increase your physical activity, do so gradually and with the approval of your doctor -STOP and seek medical care immediately if you have any chest pain, chest discomfort or trouble breathing when starting or increasing exercise  -move and stretch your body, legs, feet and arms when sitting for long  periods -Physical activity guidelines for optimal health in adults: -get at least 150 minutes per week of moderate exercise (can talk, but not sing); this is about 20-30 minutes of sustained activity 5-7 days  per week or two 10-15 minute episodes of sustained activity 5-7 days per week -do some muscle building/resistance training/strength training at least 2 days per week  -balance exercises 3+ days per week:   Stand somewhere where you have something sturdy to hold onto if you lose balance    1) lift up on toes, then back down, start with 5x per day and work up to 20x   2) stand and lift one leg straight out to the side so that foot is a few inches of the floor, start with 5x each side and work up to 20x each side   3) stand on one foot, start with 5 seconds each side and work up to 20 seconds on each side  If you need ideas or help with getting more active:  -Silver sneakers https://tools.silversneakers.com  -Walk with a Doc: Http://www.duncan-williams.com/  -try to include resistance (weight lifting/strength building) and balance exercises twice per week: or the following link for ideas: http://castillo-powell.com/  buyducts.dk  STRESS MANAGEMENT: -can try meditating, or just sitting quietly with deep breathing while intentionally relaxing all parts of your body for 5 minutes daily -if you need further help with stress, anxiety or depression please follow up with your primary doctor or contact the wonderful folks at [facility]: [phone]  SOCIAL CONNECTIONS: -options in [place] if you wish to engage in more social and exercise related activities:  -Silver sneakers https://tools.silversneakers.com  -Walk with a Doc: Http://www.duncan-williams.com/  -Check out the [facility] Active Adults 50+ section on the [place] of [employer] (hiking clubs, book clubs, cards and games, chess, exercise classes, aquatic classes and much more) - see the website for details: https://www.[place].gov/departments/parks-recreation/active-adults50  -YouTube has lots of exercise videos for different  ages and abilities as well  -[facility] (a variety of indoor and outdoor inperson activities for adults). [phone]. [address]  -Virtual Online Classes (a variety of topics): see seniorplanet.org or call [phone]  -consider volunteering at a school, hospice center, church, senior center or elsewhere   ADVANCED HEALTHCARE DIRECTIVES:  [place] Advanced Directives assistance:   expressweek.com.cy  Everyone should have advanced health care directives in place. This is so that you get the care you want, should you ever be in a situation where you are unable to make your own medical decisions.   From the [place] Advanced Directive Website: Advance Health Care Directives are legal documents in which you give written instructions about your health care if, in the future, you cannot speak for yourself.   A health care power of attorney allows you to name a person you trust to make your health care decisions if you cannot make them yourself. A declaration of a desire for a natural death (or living will) is document, which states that you desire not to have your life prolonged by extraordinary measures if you have a terminal or incurable illness or if you are in a vegetative state. An advance instruction for mental health treatment makes a declaration of instructions, information and preferences regarding your mental health treatment. It also states that you are aware that the advance instruction authorizes a mental health treatment provider to act according to your wishes. It may also outline your consent or refusal of mental health treatment. A declaration of an anatomical gift  allows anyone over the age of 51 to make a gift by will, organ donor card or other document.   Please see the following website or an elder law attorney for forms, FAQs and for completion of advanced directives: [place]  Nurse, Learning Disability Health Care Directives Advance Health Care Directives (http://guzman.[email or web address]/)  Or copy and paste the following to your web browser: [email or web address]

## 2024-12-06 ENCOUNTER — Other Ambulatory Visit (HOSPITAL_COMMUNITY): Payer: Self-pay

## 2024-12-21 ENCOUNTER — Other Ambulatory Visit: Payer: Self-pay | Admitting: Family Medicine

## 2024-12-21 MED ORDER — METHYLPHENIDATE HCL ER (OSM) 36 MG PO TBCR: 36.0000 mg | EXTENDED_RELEASE_TABLET | Freq: Every day | ORAL | 0 refills | Status: AC

## 2024-12-21 NOTE — Telephone Encounter (Signed)
 Done

## 2024-12-21 NOTE — Telephone Encounter (Signed)
 Copied from CRM #[id number]. Topic: Clinical - Medication Refill >> Dec 21, 2024 10:40 AM Drema MATSU wrote: Medication: methylphenidate  (CONCERTA ) 36 MG PO CR tablet  Has the patient contacted their pharmacy? Yes (Agent: If no, request that the patient contact the pharmacy for the refill. If patient does not wish to contact the pharmacy document the reason why and proceed with request.) no more refills  (Agent: If yes, when and what did the pharmacy advise?)  This is the patient's preferred pharmacy:  [id number] - [place], [place] - [address] AT [address] OF [facility] [address] [place] [address] Phone: [phone] Fax: [phone]  Is this the correct pharmacy for this prescription? Yes If no, delete pharmacy and type the correct one.   Has the prescription been filled recently? Yes  Is the patient out of the medication? Yes  Has the patient been seen for an appointment in the last year OR does the patient have an upcoming appointment? Yes  Can we respond through MyChart? Yes  Agent: Please be advised that Rx refills may take up to 3 business days. We ask that you follow-up with your pharmacy.
